# Patient Record
Sex: Female | Born: 1935 | Race: White | Hispanic: No | Smoking: Never smoker
Health system: Southern US, Community
[De-identification: ages and names within clinical notes are randomized; demographics above are authoritative.]

## PROBLEM LIST (undated history)

## (undated) DIAGNOSIS — R001 Bradycardia, unspecified: Secondary | ICD-10-CM

## (undated) DIAGNOSIS — E785 Hyperlipidemia, unspecified: Secondary | ICD-10-CM

## (undated) DIAGNOSIS — I10 Essential (primary) hypertension: Secondary | ICD-10-CM

## (undated) DIAGNOSIS — K219 Gastro-esophageal reflux disease without esophagitis: Secondary | ICD-10-CM

## (undated) DIAGNOSIS — D62 Acute posthemorrhagic anemia: Secondary | ICD-10-CM

## (undated) DIAGNOSIS — F329 Major depressive disorder, single episode, unspecified: Secondary | ICD-10-CM

## (undated) DIAGNOSIS — F32A Depression, unspecified: Secondary | ICD-10-CM

## (undated) DIAGNOSIS — M8580 Other specified disorders of bone density and structure, unspecified site: Secondary | ICD-10-CM

## (undated) DIAGNOSIS — K5790 Diverticulosis of intestine, part unspecified, without perforation or abscess without bleeding: Secondary | ICD-10-CM

## (undated) DIAGNOSIS — I480 Paroxysmal atrial fibrillation: Secondary | ICD-10-CM

## (undated) DIAGNOSIS — K922 Gastrointestinal hemorrhage, unspecified: Secondary | ICD-10-CM

## (undated) DIAGNOSIS — I5032 Chronic diastolic (congestive) heart failure: Secondary | ICD-10-CM

## (undated) DIAGNOSIS — F419 Anxiety disorder, unspecified: Secondary | ICD-10-CM

## (undated) HISTORY — DX: Depression, unspecified: F32.A

## (undated) HISTORY — DX: Gastro-esophageal reflux disease without esophagitis: K21.9

## (undated) HISTORY — DX: Essential (primary) hypertension: I10

## (undated) HISTORY — PX: TONSILLECTOMY: SUR1361

## (undated) HISTORY — PX: APPENDECTOMY: SHX54

## (undated) HISTORY — DX: Gastrointestinal hemorrhage, unspecified: K92.2

## (undated) HISTORY — DX: Anxiety disorder, unspecified: F41.9

## (undated) HISTORY — DX: Major depressive disorder, single episode, unspecified: F32.9

## (undated) HISTORY — PX: TOTAL ABDOMINAL HYSTERECTOMY: SHX209

## (undated) HISTORY — PX: CARDIAC DEFIBRILLATOR PLACEMENT: SHX171

## (undated) HISTORY — DX: Diverticulosis of intestine, part unspecified, without perforation or abscess without bleeding: K57.90

## (undated) HISTORY — DX: Bradycardia, unspecified: R00.1

## (undated) HISTORY — DX: Hyperlipidemia, unspecified: E78.5

## (undated) HISTORY — DX: Other specified disorders of bone density and structure, unspecified site: M85.80

## (undated) HISTORY — PX: INSERT / REPLACE / REMOVE PACEMAKER: SUR710

## (undated) HISTORY — DX: Acute posthemorrhagic anemia: D62

---

## 2001-12-04 ENCOUNTER — Ambulatory Visit (HOSPITAL_COMMUNITY): Admission: RE | Admit: 2001-12-04 | Discharge: 2001-12-04 | Payer: Self-pay | Admitting: Family Medicine

## 2001-12-04 ENCOUNTER — Encounter: Payer: Self-pay | Admitting: Family Medicine

## 2002-05-26 HISTORY — PX: ANKLE FRACTURE SURGERY: SHX122

## 2004-12-17 ENCOUNTER — Ambulatory Visit: Payer: Self-pay | Admitting: Family Medicine

## 2005-01-02 ENCOUNTER — Ambulatory Visit: Payer: Self-pay | Admitting: Family Medicine

## 2005-01-21 ENCOUNTER — Inpatient Hospital Stay: Payer: Self-pay | Admitting: Orthopaedic Surgery

## 2005-01-21 ENCOUNTER — Other Ambulatory Visit: Payer: Self-pay

## 2005-06-14 ENCOUNTER — Ambulatory Visit: Payer: Self-pay | Admitting: Family Medicine

## 2005-06-28 ENCOUNTER — Ambulatory Visit: Payer: Self-pay | Admitting: Family Medicine

## 2006-01-22 ENCOUNTER — Ambulatory Visit: Payer: Self-pay | Admitting: Family Medicine

## 2006-02-06 ENCOUNTER — Ambulatory Visit: Payer: Self-pay | Admitting: Family Medicine

## 2006-03-26 LAB — HM DEXA SCAN

## 2006-04-29 ENCOUNTER — Ambulatory Visit: Payer: Self-pay | Admitting: Family Medicine

## 2006-05-07 ENCOUNTER — Ambulatory Visit: Payer: Self-pay | Admitting: Family Medicine

## 2006-11-07 ENCOUNTER — Ambulatory Visit: Payer: Self-pay | Admitting: Family Medicine

## 2006-11-25 ENCOUNTER — Ambulatory Visit: Payer: Self-pay | Admitting: Cardiology

## 2006-12-02 ENCOUNTER — Ambulatory Visit: Payer: Self-pay | Admitting: Cardiology

## 2007-01-01 ENCOUNTER — Ambulatory Visit: Payer: Self-pay | Admitting: Cardiology

## 2007-03-18 ENCOUNTER — Ambulatory Visit: Payer: Self-pay | Admitting: Cardiology

## 2007-06-16 DIAGNOSIS — I319 Disease of pericardium, unspecified: Secondary | ICD-10-CM | POA: Insufficient documentation

## 2007-06-16 DIAGNOSIS — E7800 Pure hypercholesterolemia, unspecified: Secondary | ICD-10-CM | POA: Insufficient documentation

## 2007-06-16 DIAGNOSIS — I10 Essential (primary) hypertension: Secondary | ICD-10-CM

## 2007-06-16 DIAGNOSIS — I498 Other specified cardiac arrhythmias: Secondary | ICD-10-CM | POA: Insufficient documentation

## 2007-06-16 DIAGNOSIS — J309 Allergic rhinitis, unspecified: Secondary | ICD-10-CM | POA: Insufficient documentation

## 2007-06-16 DIAGNOSIS — M899 Disorder of bone, unspecified: Secondary | ICD-10-CM | POA: Insufficient documentation

## 2007-06-16 DIAGNOSIS — K649 Unspecified hemorrhoids: Secondary | ICD-10-CM | POA: Insufficient documentation

## 2007-06-16 DIAGNOSIS — M949 Disorder of cartilage, unspecified: Secondary | ICD-10-CM

## 2007-06-16 DIAGNOSIS — F411 Generalized anxiety disorder: Secondary | ICD-10-CM | POA: Insufficient documentation

## 2007-06-16 DIAGNOSIS — I872 Venous insufficiency (chronic) (peripheral): Secondary | ICD-10-CM | POA: Insufficient documentation

## 2007-06-16 DIAGNOSIS — R011 Cardiac murmur, unspecified: Secondary | ICD-10-CM

## 2007-06-16 DIAGNOSIS — K219 Gastro-esophageal reflux disease without esophagitis: Secondary | ICD-10-CM | POA: Insufficient documentation

## 2007-06-16 DIAGNOSIS — F4323 Adjustment disorder with mixed anxiety and depressed mood: Secondary | ICD-10-CM | POA: Insufficient documentation

## 2007-06-17 ENCOUNTER — Ambulatory Visit: Payer: Self-pay | Admitting: Family Medicine

## 2007-06-18 LAB — CONVERTED CEMR LAB
ALT: 21 units/L (ref 0–35)
AST: 26 units/L (ref 0–37)
Albumin: 4.3 g/dL (ref 3.5–5.2)
BUN: 10 mg/dL (ref 6–23)
Basophils Absolute: 0 10*3/uL (ref 0.0–0.1)
Basophils Relative: 0.6 % (ref 0.0–1.0)
CO2: 31 meq/L (ref 19–32)
Calcium: 10.1 mg/dL (ref 8.4–10.5)
Chloride: 107 meq/L (ref 96–112)
Cholesterol: 237 mg/dL (ref 0–200)
Creatinine, Ser: 0.9 mg/dL (ref 0.4–1.2)
Direct LDL: 153.2 mg/dL
Eosinophils Absolute: 0.1 10*3/uL (ref 0.0–0.6)
Eosinophils Relative: 2.3 % (ref 0.0–5.0)
GFR calc Af Amer: 77 mL/min
GFR calc non Af Amer: 64 mL/min
Glucose, Bld: 97 mg/dL (ref 70–99)
HCT: 41.5 % (ref 36.0–46.0)
HDL: 51.9 mg/dL (ref >39.0)
Hemoglobin: 14 g/dL (ref 12.0–15.0)
Lymphocytes Relative: 33 % (ref 12.0–46.0)
MCHC: 33.7 g/dL (ref 30.0–36.0)
MCV: 92.4 fL (ref 78.0–100.0)
Monocytes Absolute: 0.5 10*3/uL (ref 0.2–0.7)
Monocytes Relative: 9.2 % (ref 3.0–11.0)
Neutro Abs: 3 10*3/uL (ref 1.4–7.7)
Neutrophils Relative %: 54.9 % (ref 43.0–77.0)
Phosphorus: 3 mg/dL (ref 2.3–4.6)
Platelets: 189 10*3/uL (ref 150–400)
Potassium: 4.6 meq/L (ref 3.5–5.1)
RBC: 4.49 M/uL (ref 3.87–5.11)
RDW: 12.8 % (ref 11.5–14.6)
Sodium: 143 meq/L (ref 135–145)
TSH: 3.06 microintl units/mL (ref 0.35–5.50)
Total CHOL/HDL Ratio: 4.6
Triglycerides: 124 mg/dL (ref 0–149)
VLDL: 25 mg/dL (ref 0–40)
WBC: 5.4 10*3/uL (ref 4.5–10.5)

## 2007-06-24 ENCOUNTER — Ambulatory Visit: Payer: Self-pay | Admitting: Family Medicine

## 2007-06-25 ENCOUNTER — Encounter (INDEPENDENT_AMBULATORY_CARE_PROVIDER_SITE_OTHER): Payer: Self-pay | Admitting: *Deleted

## 2007-07-09 ENCOUNTER — Encounter: Payer: Self-pay | Admitting: Family Medicine

## 2007-07-15 ENCOUNTER — Encounter (INDEPENDENT_AMBULATORY_CARE_PROVIDER_SITE_OTHER): Payer: Self-pay | Admitting: *Deleted

## 2007-09-18 ENCOUNTER — Ambulatory Visit: Payer: Self-pay | Admitting: Family Medicine

## 2007-09-21 LAB — CONVERTED CEMR LAB
ALT: 17 units/L (ref 0–35)
AST: 21 units/L (ref 0–37)
Cholesterol: 199 mg/dL (ref 0–200)
HDL: 52 mg/dL (ref >39.0)
LDL Cholesterol: 127 mg/dL — ABNORMAL HIGH (ref 0–99)
Total CHOL/HDL Ratio: 3.8
Triglycerides: 99 mg/dL (ref 0–149)
VLDL: 20 mg/dL (ref 0–40)

## 2007-09-22 ENCOUNTER — Ambulatory Visit: Payer: Self-pay | Admitting: Family Medicine

## 2007-11-11 ENCOUNTER — Ambulatory Visit: Payer: Self-pay | Admitting: Family Medicine

## 2007-12-14 ENCOUNTER — Ambulatory Visit: Payer: Self-pay | Admitting: Internal Medicine

## 2007-12-30 ENCOUNTER — Ambulatory Visit: Payer: Self-pay | Admitting: Internal Medicine

## 2007-12-30 ENCOUNTER — Encounter: Payer: Self-pay | Admitting: Internal Medicine

## 2007-12-31 ENCOUNTER — Encounter: Payer: Self-pay | Admitting: Internal Medicine

## 2007-12-31 LAB — CONVERTED CEMR LAB
BUN: 18 mg/dL (ref 6–23)
Basophils Absolute: 0 10*3/uL (ref 0.0–0.1)
Basophils Relative: 1 % (ref 0–1)
CO2: 22 meq/L (ref 19–32)
Calcium: 9.4 mg/dL (ref 8.4–10.5)
Chloride: 108 meq/L (ref 96–112)
Creatinine, Ser: 0.82 mg/dL (ref 0.40–1.20)
Eosinophils Absolute: 0.1 10*3/uL (ref 0.0–0.7)
Eosinophils Relative: 3 % (ref 0–5)
Glucose, Bld: 90 mg/dL (ref 70–99)
HCT: 42.8 % (ref 36.0–46.0)
Hemoglobin: 13.6 g/dL (ref 12.0–15.0)
INR: 1.1 (ref 0.0–1.5)
Lymphocytes Relative: 35 % (ref 12–46)
Lymphs Abs: 1.6 10*3/uL (ref 0.7–4.0)
MCHC: 31.8 g/dL (ref 30.0–36.0)
MCV: 97.7 fL (ref 78.0–100.0)
Monocytes Absolute: 0.5 10*3/uL (ref 0.1–1.0)
Monocytes Relative: 11 % (ref 3–12)
Neutro Abs: 2.4 10*3/uL (ref 1.7–7.7)
Neutrophils Relative %: 52 % (ref 43–77)
Platelets: 165 10*3/uL (ref 150–400)
Potassium: 4.3 meq/L (ref 3.5–5.3)
Prothrombin Time: 14 s (ref 11.6–15.2)
RBC: 4.38 M/uL (ref 3.87–5.11)
RDW: 13.9 % (ref 11.5–15.5)
Sodium: 145 meq/L (ref 135–145)
WBC: 4.8 10*3/uL (ref 4.0–10.5)
aPTT: 37 s (ref 24–37)

## 2008-01-07 ENCOUNTER — Ambulatory Visit: Payer: Self-pay | Admitting: Internal Medicine

## 2008-01-07 ENCOUNTER — Ambulatory Visit (HOSPITAL_COMMUNITY): Admission: RE | Admit: 2008-01-07 | Discharge: 2008-01-08 | Payer: Self-pay | Admitting: Internal Medicine

## 2008-01-07 ENCOUNTER — Encounter: Payer: Self-pay | Admitting: Family Medicine

## 2008-01-08 ENCOUNTER — Encounter: Payer: Self-pay | Admitting: Family Medicine

## 2008-01-25 ENCOUNTER — Ambulatory Visit: Payer: Self-pay

## 2008-04-11 ENCOUNTER — Ambulatory Visit: Payer: Self-pay | Admitting: Internal Medicine

## 2008-05-18 ENCOUNTER — Ambulatory Visit: Payer: Self-pay | Admitting: Family Medicine

## 2008-06-27 ENCOUNTER — Ambulatory Visit: Payer: Self-pay | Admitting: Family Medicine

## 2008-06-30 LAB — CONVERTED CEMR LAB
ALT: 22 units/L (ref 0–35)
AST: 27 units/L (ref 0–37)
Albumin: 4.4 g/dL (ref 3.5–5.2)
Alkaline Phosphatase: 67 units/L (ref 39–117)
BUN: 19 mg/dL (ref 6–23)
Basophils Absolute: 0.1 10*3/uL (ref 0.0–0.1)
Basophils Relative: 0.9 % (ref 0.0–3.0)
Bilirubin, Direct: 0.1 mg/dL (ref 0.0–0.3)
CO2: 33 meq/L — ABNORMAL HIGH (ref 19–32)
Calcium: 9.9 mg/dL (ref 8.4–10.5)
Chloride: 103 meq/L (ref 96–112)
Creatinine, Ser: 1 mg/dL (ref 0.4–1.2)
Eosinophils Absolute: 0.1 10*3/uL (ref 0.0–0.7)
Eosinophils Relative: 2.1 % (ref 0.0–5.0)
GFR calc Af Amer: 68 mL/min
GFR calc non Af Amer: 56 mL/min
Glucose, Bld: 84 mg/dL (ref 70–99)
HCT: 37.5 % (ref 36.0–46.0)
Hemoglobin: 13.1 g/dL (ref 12.0–15.0)
Lymphocytes Relative: 27.5 % (ref 12.0–46.0)
MCHC: 34.9 g/dL (ref 30.0–36.0)
MCV: 93 fL (ref 78.0–100.0)
Monocytes Absolute: 0.3 10*3/uL (ref 0.1–1.0)
Monocytes Relative: 4.8 % (ref 3.0–12.0)
Neutro Abs: 4.3 10*3/uL (ref 1.4–7.7)
Neutrophils Relative %: 64.7 % (ref 43.0–77.0)
Phosphorus: 3.6 mg/dL (ref 2.3–4.6)
Platelets: 176 10*3/uL (ref 150–400)
Potassium: 4.4 meq/L (ref 3.5–5.1)
RBC: 4.04 M/uL (ref 3.87–5.11)
RDW: 12.3 % (ref 11.5–14.6)
Sodium: 141 meq/L (ref 135–145)
TSH: 3.68 microintl units/mL (ref 0.35–5.50)
Total Bilirubin: 0.7 mg/dL (ref 0.3–1.2)
Total Protein: 7.2 g/dL (ref 6.0–8.3)
Vit D, 1,25-Dihydroxy: 27 — ABNORMAL LOW (ref 30–89)
WBC: 6.6 10*3/uL (ref 4.5–10.5)

## 2008-07-15 ENCOUNTER — Ambulatory Visit: Payer: Self-pay | Admitting: Family Medicine

## 2008-07-15 LAB — CONVERTED CEMR LAB
OCCULT 1: NEGATIVE
OCCULT 2: NEGATIVE
OCCULT 3: NEGATIVE

## 2008-07-20 ENCOUNTER — Encounter: Payer: Self-pay | Admitting: Family Medicine

## 2008-07-25 ENCOUNTER — Telehealth: Payer: Self-pay | Admitting: Family Medicine

## 2008-10-03 ENCOUNTER — Ambulatory Visit: Payer: Self-pay | Admitting: Family Medicine

## 2008-10-03 DIAGNOSIS — M6740 Ganglion, unspecified site: Secondary | ICD-10-CM | POA: Insufficient documentation

## 2008-10-03 DIAGNOSIS — E559 Vitamin D deficiency, unspecified: Secondary | ICD-10-CM | POA: Insufficient documentation

## 2008-10-03 DIAGNOSIS — M1990 Unspecified osteoarthritis, unspecified site: Secondary | ICD-10-CM | POA: Insufficient documentation

## 2008-10-05 ENCOUNTER — Encounter (INDEPENDENT_AMBULATORY_CARE_PROVIDER_SITE_OTHER): Payer: Self-pay | Admitting: *Deleted

## 2008-10-05 LAB — CONVERTED CEMR LAB: Vit D, 1,25-Dihydroxy: 30 (ref 30–89)

## 2008-12-09 ENCOUNTER — Encounter (INDEPENDENT_AMBULATORY_CARE_PROVIDER_SITE_OTHER): Payer: Self-pay

## 2009-01-09 ENCOUNTER — Ambulatory Visit: Payer: Self-pay | Admitting: Internal Medicine

## 2009-01-20 ENCOUNTER — Telehealth: Payer: Self-pay | Admitting: Family Medicine

## 2009-01-20 ENCOUNTER — Encounter (INDEPENDENT_AMBULATORY_CARE_PROVIDER_SITE_OTHER): Payer: Self-pay | Admitting: *Deleted

## 2009-02-23 ENCOUNTER — Ambulatory Visit: Payer: Self-pay | Admitting: Family Medicine

## 2009-03-06 ENCOUNTER — Ambulatory Visit: Payer: Self-pay | Admitting: Family Medicine

## 2009-04-12 ENCOUNTER — Encounter: Payer: Self-pay | Admitting: Internal Medicine

## 2009-04-12 ENCOUNTER — Ambulatory Visit: Payer: Self-pay | Admitting: Internal Medicine

## 2009-04-17 ENCOUNTER — Encounter: Payer: Self-pay | Admitting: Internal Medicine

## 2009-07-12 ENCOUNTER — Ambulatory Visit: Payer: Self-pay | Admitting: Internal Medicine

## 2009-07-13 ENCOUNTER — Encounter: Payer: Self-pay | Admitting: Internal Medicine

## 2009-07-13 ENCOUNTER — Telehealth (INDEPENDENT_AMBULATORY_CARE_PROVIDER_SITE_OTHER): Payer: Self-pay | Admitting: *Deleted

## 2009-07-19 ENCOUNTER — Ambulatory Visit: Payer: Self-pay | Admitting: Family Medicine

## 2009-07-24 LAB — CONVERTED CEMR LAB
ALT: 17 units/L (ref 0–35)
AST: 23 units/L (ref 0–37)
Albumin: 4.5 g/dL (ref 3.5–5.2)
Alkaline Phosphatase: 59 units/L (ref 39–117)
BUN: 15 mg/dL (ref 6–23)
Basophils Absolute: 0 10*3/uL (ref 0.0–0.1)
Basophils Relative: 0.6 % (ref 0.0–3.0)
Bilirubin, Direct: 0 mg/dL (ref 0.0–0.3)
CO2: 31 meq/L (ref 19–32)
Calcium: 10 mg/dL (ref 8.4–10.5)
Chloride: 99 meq/L (ref 96–112)
Cholesterol: 273 mg/dL — ABNORMAL HIGH (ref 0–200)
Creatinine, Ser: 1.1 mg/dL (ref 0.4–1.2)
Direct LDL: 193.4 mg/dL
Eosinophils Absolute: 0.1 10*3/uL (ref 0.0–0.7)
Eosinophils Relative: 2.4 % (ref 0.0–5.0)
GFR calc non Af Amer: 50.3 mL/min (ref >60)
Glucose, Bld: 104 mg/dL — ABNORMAL HIGH (ref 70–99)
HCT: 37.5 % (ref 36.0–46.0)
HDL: 61.8 mg/dL (ref >39.00)
Hemoglobin: 12.7 g/dL (ref 12.0–15.0)
Lymphocytes Relative: 25.1 % (ref 12.0–46.0)
Lymphs Abs: 1.1 10*3/uL (ref 0.7–4.0)
MCHC: 34 g/dL (ref 30.0–36.0)
MCV: 95 fL (ref 78.0–100.0)
Monocytes Absolute: 0.4 10*3/uL (ref 0.1–1.0)
Monocytes Relative: 9 % (ref 3.0–12.0)
Neutro Abs: 2.9 10*3/uL (ref 1.4–7.7)
Neutrophils Relative %: 62.9 % (ref 43.0–77.0)
Phosphorus: 3.6 mg/dL (ref 2.3–4.6)
Platelets: 176 10*3/uL (ref 150.0–400.0)
Potassium: 4.4 meq/L (ref 3.5–5.1)
RBC: 3.94 M/uL (ref 3.87–5.11)
RDW: 12.7 % (ref 11.5–14.6)
Sodium: 138 meq/L (ref 135–145)
TSH: 2.44 microintl units/mL (ref 0.35–5.50)
Total Bilirubin: 1.2 mg/dL (ref 0.3–1.2)
Total CHOL/HDL Ratio: 4
Total Protein: 7.5 g/dL (ref 6.0–8.3)
Triglycerides: 90 mg/dL (ref 0.0–149.0)
VLDL: 18 mg/dL (ref 0.0–40.0)
Vit D, 25-Hydroxy: 25 ng/mL — ABNORMAL LOW (ref 30–89)
WBC: 4.5 10*3/uL (ref 4.5–10.5)

## 2009-07-28 ENCOUNTER — Ambulatory Visit: Payer: Self-pay | Admitting: Family Medicine

## 2009-07-29 LAB — CONVERTED CEMR LAB: Fecal Occult Bld: NEGATIVE

## 2009-07-31 ENCOUNTER — Encounter (INDEPENDENT_AMBULATORY_CARE_PROVIDER_SITE_OTHER): Payer: Self-pay | Admitting: *Deleted

## 2009-08-02 ENCOUNTER — Encounter: Payer: Self-pay | Admitting: Internal Medicine

## 2009-08-04 ENCOUNTER — Encounter: Payer: Self-pay | Admitting: Family Medicine

## 2009-08-15 ENCOUNTER — Encounter (INDEPENDENT_AMBULATORY_CARE_PROVIDER_SITE_OTHER): Payer: Self-pay | Admitting: *Deleted

## 2009-08-15 LAB — HM MAMMOGRAPHY: HM Mammogram: NORMAL

## 2009-08-31 ENCOUNTER — Ambulatory Visit: Payer: Self-pay | Admitting: Family Medicine

## 2009-08-31 DIAGNOSIS — R42 Dizziness and giddiness: Secondary | ICD-10-CM | POA: Insufficient documentation

## 2009-09-26 ENCOUNTER — Ambulatory Visit: Payer: Self-pay | Admitting: Family Medicine

## 2009-10-11 ENCOUNTER — Ambulatory Visit: Payer: Self-pay | Admitting: Internal Medicine

## 2009-10-20 ENCOUNTER — Encounter: Payer: Self-pay | Admitting: Internal Medicine

## 2009-10-30 ENCOUNTER — Ambulatory Visit: Payer: Self-pay | Admitting: Family Medicine

## 2009-12-25 ENCOUNTER — Ambulatory Visit: Payer: Self-pay | Admitting: Internal Medicine

## 2009-12-25 DIAGNOSIS — I4949 Other premature depolarization: Secondary | ICD-10-CM

## 2009-12-25 DIAGNOSIS — Z950 Presence of cardiac pacemaker: Secondary | ICD-10-CM | POA: Insufficient documentation

## 2010-01-29 ENCOUNTER — Ambulatory Visit: Payer: Self-pay | Admitting: Family Medicine

## 2010-01-30 LAB — CONVERTED CEMR LAB
ALT: 18 units/L (ref 0–35)
AST: 23 units/L (ref 0–37)
Direct LDL: 175.2 mg/dL
HDL: 56 mg/dL (ref >39.00)
Total CHOL/HDL Ratio: 5
VLDL: 24.6 mg/dL (ref 0.0–40.0)

## 2010-02-01 ENCOUNTER — Ambulatory Visit: Payer: Self-pay | Admitting: Family Medicine

## 2010-03-21 ENCOUNTER — Ambulatory Visit: Payer: Self-pay | Admitting: Internal Medicine

## 2010-07-25 ENCOUNTER — Ambulatory Visit: Payer: Self-pay | Admitting: Family Medicine

## 2010-07-25 LAB — CONVERTED CEMR LAB
ALT: 15 units/L (ref 0–35)
AST: 21 units/L (ref 0–37)
BUN: 20 mg/dL (ref 6–23)
Basophils Absolute: 0 10*3/uL (ref 0.0–0.1)
CO2: 32 meq/L (ref 19–32)
Chloride: 102 meq/L (ref 96–112)
Eosinophils Relative: 2.5 % (ref 0.0–5.0)
GFR calc non Af Amer: 48.15 mL/min (ref >60)
HCT: 35.6 % — ABNORMAL LOW (ref 36.0–46.0)
Hemoglobin: 12.2 g/dL (ref 12.0–15.0)
Lymphocytes Relative: 28.4 % (ref 12.0–46.0)
Lymphs Abs: 1.4 10*3/uL (ref 0.7–4.0)
Monocytes Relative: 10 % (ref 3.0–12.0)
Neutro Abs: 2.8 10*3/uL (ref 1.4–7.7)
Phosphorus: 3.4 mg/dL (ref 2.3–4.6)
Potassium: 4.8 meq/L (ref 3.5–5.1)
RBC: 3.8 M/uL — ABNORMAL LOW (ref 3.87–5.11)
RDW: 13.6 % (ref 11.5–14.6)
Sodium: 141 meq/L (ref 135–145)
WBC: 4.8 10*3/uL (ref 4.5–10.5)

## 2010-07-29 LAB — CONVERTED CEMR LAB: Vit D, 25-Hydroxy: 25 ng/mL — ABNORMAL LOW (ref 30–89)

## 2010-08-01 ENCOUNTER — Ambulatory Visit: Payer: Self-pay | Admitting: Family Medicine

## 2010-08-16 ENCOUNTER — Ambulatory Visit: Payer: Self-pay | Admitting: Family Medicine

## 2010-09-25 NOTE — Assessment & Plan Note (Signed)
Summary: F/U AFTER LABS / LFW   Vital Signs:  Patient profile:   77 year old female Height:      63 inches Weight:      154.75 pounds BMI:     27.51 Temp:     97.9 degrees F oral Pulse rate:   68 / minute Pulse rhythm:   regular BP sitting:   142 / 88  (left arm) Cuff size:   regular  Vitals Entered By: Lewanda Rife LPN (February 01, 453 8:31 AM)  Serial Vital Signs/Assessments:  Time      Position  BP       Pulse  Resp  Temp     By                     132/85                         Judith Part MD  CC: three month f/u after labs   History of Present Illness: here for f/u of chol and HTN  lipids did improve but still quite high trig 123, HDL 56 (good) and LDL 175 down from 193 has fam hx high chol diet -- is doing pretty good - staying away from red meat and fried foods still eats some cookies - but less   is not interested in medicine   wt is down 3 lb  is exercising by working in the yard   bp first check 142/88  Allergies: 1)  Penicillin 2)  Macrodantin 3)  Ace Inhibitors 4)  Fosamax  Past History:  Past Surgical History: Last updated: 01/20/2008 Appendectomy Hysterectomy- total, fibroids Tonsillectomy Ankle fracture- surgery (05/2002) Duodenal ulcer- tx'd for H pylori Elbow fracture 906/2006) MRI head- age related ischemic changes and volume loss (05/2005) Dexa- osteopenia femoral neck T-1.85 (03/2006) 5/09 pacemaker implantation   Family History: Last updated: 01/09/2009 Father: MI age 1 Mother: died from pneumonia age 29, had alzheimers, dep Siblings: brother with septal defect and CABG x 3, another brother died as a child GM bladder ca  Social History: Last updated: 06/27/2008 Marital Status: Married Children: 2 Occupation: retired Never Smoked alcohol- infrequently  Risk Factors: Smoking Status: never (11/11/2007)  Past Medical History: Allergic rhinitis Anxiety Depression GERD Hypertension Osteopenia bradycardia--  pacemaker 5/09 hyperlipidemia   Review of Systems General:  Denies fatigue, loss of appetite, and malaise. Eyes:  Denies blurring and eye irritation. CV:  Denies chest pain or discomfort, palpitations, and shortness of breath with exertion. Resp:  Denies cough and wheezing. GI:  Denies abdominal pain, bloody stools, change in bowel habits, indigestion, and nausea. MS:  Denies joint pain, joint redness, joint swelling, and stiffness. Derm:  Denies itching, lesion(s), poor wound healing, and rash. Neuro:  Denies numbness and tingling. Endo:  Denies cold intolerance, excessive thirst, excessive urination, and heat intolerance. Heme:  Denies abnormal bruising and bleeding.  Physical Exam  General:  Well-developed,well-nourished,in no acute distress; alert,appropriate and cooperative throughout examination Head:  normocephalic, atraumatic, and no abnormalities observed.   Eyes:  vision grossly intact, pupils equal, pupils round, and pupils reactive to light.   Mouth:  pharynx pink and moist.   Neck:  supple with full rom and no masses or thyromegally, no JVD or carotid bruit  Lungs:  Normal respiratory effort, chest expands symmetrically. Lungs are clear to auscultation, no crackles or wheezes. Heart:  Normal rate and regular rhythm. S1 and S2 normal without  gallop, murmur, click, rub or other extra sounds. Msk:  No deformity or scoliosis noted of thoracic or lumbar spine.   Extremities:  No clubbing, cyanosis, edema, or deformity noted with normal full range of motion of all joints.   some varicosities  Neurologic:  sensation intact to light touch, gait normal, and DTRs symmetrical and normal.   Skin:  Intact without suspicious lesions or rashes Cervical Nodes:  No lymphadenopathy noted Psych:  normal affect, talkative and pleasant    Impression & Recommendations:  Problem # 1:  Hx of HYPERCHOLESTEROLEMIA (ICD-272.0) Assessment Improved  this is improved but not at goal at pt's age  - she does not wish to tx it  we disc low sat fat diet in detail  will re check 6 mo   Labs Reviewed: SGOT: 23 (01/29/2010)   SGPT: 18 (01/29/2010)  Prior 10 Yr Risk Heart Disease: 15 % (03/06/2009)   HDL:56.00 (01/29/2010), 61.80 (07/19/2009)  LDL:127 (09/18/2007), DEL (06/17/2007)  Chol:260 (01/29/2010), 273 (07/19/2009)  Trig:123.0 (01/29/2010), 90.0 (07/19/2009)  Problem # 2:  HYPERTENSION (ICD-401.9) Assessment: Unchanged  better on 2nd check fairly controlled with current med and exercise  Her updated medication list for this problem includes:    Diovan 160 Mg Tabs (Valsartan) ..... One by mouth daily    Hydrochlorothiazide 25 Mg Tabs (Hydrochlorothiazide) .Marland Kitchen... 1 by mouth once daily in am  BP today: 142/88-- re check 132/ 85 at rest  Prior BP: 142/50 (12/25/2009)  Prior 10 Yr Risk Heart Disease: 15 % (03/06/2009)  Labs Reviewed: K+: 4.4 (07/19/2009) Creat: : 1.1 (07/19/2009)   Chol: 260 (01/29/2010)   HDL: 56.00 (01/29/2010)   LDL: 127 (09/18/2007)   TG: 123.0 (01/29/2010)  Complete Medication List: 1)  Zoloft 100 Mg Tabs (Sertraline hcl) .... One by mouth once daily 2)  Diovan 160 Mg Tabs (Valsartan) .... One by mouth daily 3)  Ibuprofen 800 Mg Tabs (Ibuprofen) .... One by mouth once daily as needed- always with food 4)  Centrum Silver Tabs (Multiple vitamins-minerals) .... Take 1 tablet by mouth once a day 5)  Hydrochlorothiazide 25 Mg Tabs (Hydrochlorothiazide) .Marland Kitchen.. 1 by mouth once daily in am 6)  Omeprazole 20 Mg Cpdr (Omeprazole) .Marland Kitchen.. 1 by mouth once daily in the am as needed  Patient Instructions: 1)  cholesterol is improved but still high 2)  work the best you can with diet  3)  if you are interested in cholesterol medicine - let me know 4)  no change in medicines 5)  schedule fasting labs in 6 months and then 30 min check up  6)  lipid/ast/alt/renal / cbc with diff/ tsh vit D level   272, 401.1, 733.  Current Allergies (reviewed  today): PENICILLIN MACRODANTIN ACE INHIBITORS FOSAMAX
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
## 2010-09-25 NOTE — Assessment & Plan Note (Signed)
Summary: FOLLOW UP FROM CPX/[facility]H   Vital Signs:  Patient profile:   77 year old female Height:      62 inches Weight:      158.25 pounds Temp:     97.5 degrees F oral Pulse rate:   76 / minute Pulse rhythm:   regular BP sitting:   138 / 82  (left arm) Cuff size:   regular  Vitals Entered By: Lewanda Rife LPN (October 31, 1015 8:58 AM)  History of Present Illness: here for high cholesterol - it had gone way up   wt is down 2 lb  HTN stable 138/82 today   lipids trig 90/ HDL 61 and LDL 193 has never been on chol med brother has has high chol   eats red meat 2 times per mo  fried foods -- once per week  fast food about once per week  eats mod amt of cheese  breakfast meats - hardly ever  does eat eggs -- 2-3 per week  shellfish -- none  flounder fried once per week  does eat some sweets -- cookies mostly  does not cook with real butter at all - uses corn and olive oil       Allergies: 1)  Penicillin 2)  Macrodantin 3)  Ace Inhibitors 4)  Fosamax  Past History:  Past Medical History: Last updated: 01/20/2008 Allergic rhinitis Anxiety Depression GERD Hypertension Osteopenia bradycardia-- pacemaker 5/09  Past Surgical History: Last updated: 01/20/2008 Appendectomy Hysterectomy- total, fibroids Tonsillectomy Ankle fracture- surgery (05/2002) Duodenal ulcer- tx'd for H pylori Elbow fracture 906/2006) MRI head- age related ischemic changes and volume loss (05/2005) Dexa- osteopenia femoral neck T-1.85 (03/2006) 5/09 pacemaker implantation   Family History: Last updated: 01/09/2009 Father: MI age 82 Mother: died from pneumonia age 49, had alzheimers, dep Siblings: brother with septal defect and CABG x 3, another brother died as a child GM bladder ca  Social History: Last updated: 06/27/2008 Marital Status: Married Children: 2 Occupation: retired Never Smoked alcohol- infrequently  Risk Factors: Smoking Status: never (11/11/2007)  Review of  Systems General:  Denies fatigue, fever, loss of appetite, and malaise. Eyes:  Denies blurring and eye irritation. CV:  Denies chest pain or discomfort, palpitations, and shortness of breath with exertion. Resp:  Denies chest pain with inspiration, cough, and wheezing. GI:  Denies abdominal pain and change in bowel habits. Derm:  Denies rash. Neuro:  Denies headaches, numbness, and weakness. Endo:  Denies cold intolerance, excessive thirst, and heat intolerance. Heme:  Denies abnormal bruising and bleeding.  Physical Exam  General:  Well-developed,well-nourished,in no acute distress; alert,appropriate and cooperative throughout examination Head:  normocephalic, atraumatic, and no abnormalities observed.   Eyes:  vision grossly intact, pupils equal, pupils round, and pupils reactive to light.   Mouth:  pharynx pink and moist.   Neck:  supple with full rom and no masses or thyromegally, no JVD or carotid bruit  Lungs:  Normal respiratory effort, chest expands symmetrically. Lungs are clear to auscultation, no crackles or wheezes. Heart:  Normal rate and regular rhythm. S1 and S2 normal without gallop, murmur, click, rub or other extra sounds. Pulses:  R and L posterior tibial pulses are full and equal bilaterally  Extremities:  No clubbing, cyanosis, edema, or deformity noted with normal full range of motion of all joints.   Neurologic:  sensation intact to light touch, gait normal, and DTRs symmetrical and normal.   Skin:  Intact without suspicious lesions or rashes Cervical Nodes:  No lymphadenopathy noted Psych:  normal affect, talkative and pleasant    Impression & Recommendations:  Problem # 1:  Hx of HYPERCHOLESTEROLEMIA (ICD-272.0) Assessment Deteriorated  much worse lately with high sat fat diet  (LDL up from 120s to 190s) disc this in detail and suggestions made  will work on this and exercise for 3 mo and then re check fasting lab and f/u   Labs Reviewed: SGOT: 23  (07/19/2009)   SGPT: 17 (07/19/2009)  Prior 10 Yr Risk Heart Disease: 15 % (03/06/2009)   HDL:61.80 (07/19/2009), 52.0 (09/18/2007)  LDL:127 (09/18/2007), DEL (06/17/2007)  Chol:273 (07/19/2009), 199 (09/18/2007)  Trig:90.0 (07/19/2009), 99 (09/18/2007)  Complete Medication List: 1)  Zoloft 100 Mg Tabs (Sertraline hcl) .... One by mouth once daily 2)  Diovan 160 Mg Tabs (Valsartan) .... One by mouth daily 3)  Ibuprofen 800 Mg Tabs (Ibuprofen) .... One by mouth once daily as needed- always with food 4)  Centrum Silver Tabs (Multiple vitamins-minerals) .... Take 1 tablet by mouth once a day 5)  Hydrochlorothiazide 25 Mg Tabs (Hydrochlorothiazide) .Marland Kitchen.. 1 by mouth once daily in am 6)  Omeprazole 20 Mg Cpdr (Omeprazole) .Marland Kitchen.. 1 by mouth once daily in the am  Patient Instructions: 1)  please try to eliminate fried foods and fast foods 2)  eat your fish grilled or broiled  3)  eat egg whitew without the yolks 4)  avoid red meat as much as possible  5)  cut back on the cookies  6)  schedule fasting labs in 3 months lipid/ast/alt 272 and then follow up please   Current Allergies (reviewed today): PENICILLIN MACRODANTIN ACE INHIBITORS FOSAMAX

## 2010-09-25 NOTE — Assessment & Plan Note (Signed)
Summary: THROWING UP AND DIZZY./DLO   Vital Signs:  Patient profile:   77 year old female Height:      62 inches Weight:      158.2 pounds Temp:     98.3 degrees F oral Pulse rate:   84 / minute Pulse rhythm:   regular BP sitting:   120 / 70  (left arm) Cuff size:   regular  Vitals Entered By: Benny Lennert CMA Duncan Dull) (August 31, 2009 4:18 PM)  History of Present Illness: CC: Vomiting and dizzyness.  Yesterday began feeling off balance sudden onset. Awaoke this AM in bed with room spinning. Feels room spinning when lying down. no current vertigo , but feels slightly off balance sitting there. HAs been nauseous and vomited after eating , unable to keep food or liquids down.   No abdominal pain., no chest pain.  No fever.  2 sharp momentary pain right ear. No neuro symptoms, no weakness, no confusion.   Problems Prior to Update: 1)  Gerd  (ICD-530.81) 2)  Ganglion Cyst  (ICD-727.43) 3)  Osteoarthritis  (ICD-715.90) 4)  Unspecified Vitamin D Deficiency  (ICD-268.9) 5)  Other Screening Mammogram  (ICD-V76.12) 6)  Screening For Malignant Neoplasm, Colon  (ICD-V76.51) 7)  Hx of Venous Insufficiency  (ICD-459.81) 8)  Hemorrhoids  (ICD-455.6) 9)  Hx of Pericardial Effusion  (ICD-423.9) 10)  Murmur  (ICD-785.2) 11)  Bradycardia  (ICD-427.89) 12)  Hx of Hypercholesterolemia  (ICD-272.0) 13)  Osteopenia  (ICD-733.90) 14)  Hypertension  (ICD-401.9) 15)  Gerd  (ICD-530.81) 16)  Depression  (ICD-311) 17)  Anxiety  (ICD-300.00) 18)  Allergic Rhinitis  (ICD-477.9)  Current Medications (verified): 1)  Zoloft 100 Mg  Tabs (Sertraline Hcl) .... One By Mouth Once Daily 2)  Diovan 160 Mg  Tabs (Valsartan) .... One By Mouth Daily 3)  Ibuprofen 800 Mg  Tabs (Ibuprofen) .... One By Mouth Once Daily As Needed- Always With Food 4)  Centrum Silver   Tabs (Multiple Vitamins-Minerals) .... Take 1 Tablet By Mouth Once A Day 5)  Hydrochlorothiazide 25 Mg Tabs (Hydrochlorothiazide) .Marland Kitchen.. 1  By Mouth Once Daily in Am 6)  Omeprazole 20 Mg Cpdr (Omeprazole) .Marland Kitchen.. 1 By Mouth Once Daily in The Am 7)  Astepro 0.15 % Soln (Azelastine Hcl) .Marland Kitchen.. 1-2 Sprays in Each Nostril Two Times A Day As Needed Allergy Symptoms  Allergies: 1)  Penicillin 2)  Macrodantin 3)  Ace Inhibitors 4)  Fosamax  Past History:  Past medical, surgical, family and social histories (including risk factors) reviewed, and no changes noted (except as noted below).  Past Medical History: Reviewed history from 01/20/2008 and no changes required. Allergic rhinitis Anxiety Depression GERD Hypertension Osteopenia bradycardia-- pacemaker 5/09  Past Surgical History: Reviewed history from 01/20/2008 and no changes required. Appendectomy Hysterectomy- total, fibroids Tonsillectomy Ankle fracture- surgery (05/2002) Duodenal ulcer- tx'd for H pylori Elbow fracture 906/2006) MRI head- age related ischemic changes and volume loss (05/2005) Dexa- osteopenia femoral neck T-1.85 (03/2006) 5/09 pacemaker implantation   Family History: Reviewed history from 01/09/2009 and no changes required. Father: MI age 32 Mother: died from pneumonia age 59, had alzheimers, dep Siblings: brother with septal defect and CABG x 3, another brother died as a child GM bladder ca  Social History: Reviewed history from 06/27/2008 and no changes required. Marital Status: Married Children: 2 Occupation: retired Never Smoked alcohol- infrequently  Review of Systems General:  Denies fatigue and fever. CV:  Denies chest pain or discomfort. Resp:  Denies shortness of  breath. GI:  Denies abdominal pain, bloody stools, constipation, and diarrhea. GU:  Denies abnormal vaginal bleeding and dysuria.  Physical Exam  General:  elderly femal ein NAd Eyes:  No corneal or conjunctival inflammation noted. EOMI. Perrla. Funduscopic exam benign, without hemorrhages, exudates or papilledema. Vision grossly normal. Ears:  External ear exam  shows no significant lesions or deformities.  Otoscopic examination reveals clear canals, tympanic membranes are intact bilaterally without bulging, retraction, inflammation or discharge. Hearing is grossly normal bilaterally. Nose:  External nasal examination shows no deformity or inflammation. Nasal mucosa are pink and moist without lesions or exudates. Mouth:  MMM Neck:  no carotid bruit or thyromegaly no cervical or supraclavicular lymphadenopathy  Lungs:  Normal respiratory effort, chest expands symmetrically. Lungs are clear to auscultation, no crackles or wheezes. Heart:  Normal rate and regular rhythm. S1 and S2 normal without gallop, murmur, click, rub or other extra sounds. Pulses:  R and L posterior tibial pulses are full and equal bilaterally  Extremities:  no edema  Neurologic:  No cranial nerve deficits noted. Station and gait are normal. Plantar reflexes are down-going bilaterally. DTRs are symmetrical throughout. Sensory, motor and coordinative functions appear intact.finger-to-nose normal and Romberg negative.   Unable to eply well.    Impression & Recommendations:  Problem # 1:  INTERMITTENT VERTIGO (ICD-780.4) Likely BPPV vz viral labrynthitis. Use meclizine as needed. Home exercsies given. Call if not improving in 2 weeks.  Her updated medication list for this problem includes:    Meclizine Hcl 25 Mg Tabs (Meclizine hcl) .Marland Kitchen... 1 tab by mouth q 6 hours as needed vertigo/nausea  Problem # 2:  NAUSEA AND VOMITING (ICD-787.01) Likely due to #1  vs viral GE. Treat with meclize, advance diet as tolerated. Call in AM if not tolerating liquids.   Complete Medication List: 1)  Zoloft 100 Mg Tabs (Sertraline hcl) .... One by mouth once daily 2)  Diovan 160 Mg Tabs (Valsartan) .... One by mouth daily 3)  Ibuprofen 800 Mg Tabs (Ibuprofen) .... One by mouth once daily as needed- always with food 4)  Centrum Silver Tabs (Multiple vitamins-minerals) .... Take 1 tablet by mouth once a  day 5)  Hydrochlorothiazide 25 Mg Tabs (Hydrochlorothiazide) .Marland Kitchen.. 1 by mouth once daily in am 6)  Omeprazole 20 Mg Cpdr (Omeprazole) .Marland Kitchen.. 1 by mouth once daily in the am 7)  Astepro 0.15 % Soln (Azelastine hcl) .Marland Kitchen.. 1-2 sprays in each nostril two times a day as needed allergy symptoms 8)  Meclizine Hcl 25 Mg Tabs (Meclizine hcl) .Marland Kitchen.. 1 tab by mouth q 6 hours as needed vertigo/nausea  Patient Instructions: 1)  Use meclizine for vertigo. 2)  Gradually increase liquid intake slowly over then next few hours.  3)  Push fluids.  4)  Call in AM if not tolerating any liquids.  5)  Call if vertigo not improving with home movement exercsies in 2 weeks.  Prescriptions: MECLIZINE HCL 25 MG TABS (MECLIZINE HCL) 1 tab by mouth q 6 hours as needed vertigo/nausea  #20 x 0   Entered and Authorized by:   Kerby Nora MD   Signed by:   Kerby Nora MD on 08/31/2009   Method used:   Electronically to        [id number] * (retail)       [address], [place]  [address]  Ph: [phone] or [phone]       Fax: [phone]   RxID:   [id number]   Current Allergies (reviewed today): PENICILLIN MACRODANTIN ACE INHIBITORS FOSAMAX

## 2010-09-25 NOTE — Procedures (Signed)
Summary: PACER CHECK   Current Medications (verified): 1)  Zoloft 100 Mg  Tabs (Sertraline Hcl) .... One By Mouth Once Daily 2)  Diovan 160 Mg  Tabs (Valsartan) .... One By Mouth Daily 3)  Ibuprofen 800 Mg  Tabs (Ibuprofen) .... One By Mouth Once Daily As Needed- Always With Food 4)  Centrum Silver   Tabs (Multiple Vitamins-Minerals) .... Take 1 Tablet By Mouth Once A Day 5)  Hydrochlorothiazide 25 Mg Tabs (Hydrochlorothiazide) .Marland Kitchen.. 1 By Mouth Once Daily in Am 6)  Omeprazole 20 Mg Cpdr (Omeprazole) .Marland Kitchen.. 1 By Mouth Once Daily in The Am As Needed  Allergies (verified): 1)  Penicillin 2)  Macrodantin 3)  Ace Inhibitors 4)  Fosamax   PPM Specifications Following MD:  Lewayne Bunting, MD     PPM Vendor:  [employer]     PPM Model Number:  [id number]     PPM Serial Number:  [id number] PPM DOI:  01/07/2008     PPM Implanting MD:  Lewayne Bunting, MD  Lead 1    Location: RA     DOI: 01/07/2008     Model #: [id number]     Serial #: [id number]     Status: active Lead 2    Location: RV     DOI: 01/07/2008     Model #: [id number]     Serial #: [id number]     Status: active  Magnet Response Rate:  BOL 85 ERI 65  Indications:  Symptomatic Bradycardia   PPM Follow Up Remote Check?  No Battery Voltage:  2.78 V     Battery Est. Longevity:  9.5 years     Pacer Dependent:  Yes       PPM Device Measurements Atrium  Impedance: 451 ohms, Threshold: 0.75 V at 0.4 msec Right Ventricle  Amplitude: 8.0 mV, Impedance: 596 ohms, Threshold: 0.875 V at 0.4 msec  Episodes MS Episodes:  10     Percent Mode Switch:  <0.1%     Coumadin:  No Ventricular High Rate:  7     Atrial Pacing:  98.2%     Ventricular Pacing:  2.5%  Parameters Mode:  DDDR     Lower Rate Limit:  55     Upper Rate Limit:  120 Paced AV Delay:  250     Sensed AV Delay:  230 Rate Response Parameters:  ADL rate setpoint ->8 Next Remote Date:  06/21/2010     Next Cardiology Appt Due:  12/25/2010 Tech Comments:  10 mode switch episodes = <0.1%, -coumadin.  1  VHR episodes the longest 6 seconds that appears to be at the time of mode switch.  Rate response reprogrammed as above.  Carelink transmissions every 3 months.  ROV 5/12 with Dr. [name] in [place]. Altha Harm, LPN  March 21, 2010 11:30 AM

## 2010-09-25 NOTE — Assessment & Plan Note (Signed)
Summary: ROA FOR 6 MTH-UP/JRR R/S FROM 12/6   Vital Signs:  Patient profile:   77 year old female Height:      63 inches Weight:      155.75 pounds BMI:     27.69 Temp:     98.3 degrees F oral Pulse rate:   80 / minute Pulse rhythm:   regular BP sitting:   130 / 80  (left arm) Cuff size:   regular  Vitals Entered By: Lewanda Rife LPN (August 01, 2010 12:01 PM) CC: six month f/u   History of Present Illness: here for f/u of lipids and HTN and vit D def  just had a big birthday -- had a great time   wt is stable   HTN in good control with 130/80 today  is good for her   Last Lipid ProfileCholesterol: 232 (07/25/2010 8:47:16 AM)HDL:  58.90 (07/25/2010 8:47:16 AM)LDL:  127 (09/18/2007 9:07:00 AM)Triglycerides:  Last Liver profileSGOT:  21 (07/25/2010 8:47:16 AM)SPGT:  15 (07/25/2010 8:47:16 AM)T. Bili:  1.2 (07/19/2009 10:05:05 AM)Alk Phos:  59 (07/19/2009 10:05:05 AM)   LDL down from 170s to 140s which is good  does eat high fiber cereal  really does avoid fried foods  lots of fruit and veggies  does eat eggs -- egg beaters without the yolk   occ wine    vit D low again at 25 she takes vit D every day -- only 1000   Allergies: 1)  Penicillin 2)  Macrodantin 3)  Ace Inhibitors 4)  Fosamax  Past History:  Past Medical History: Last updated: 02/01/2010 Allergic rhinitis Anxiety Depression GERD Hypertension Osteopenia bradycardia-- pacemaker 5/09 hyperlipidemia   Past Surgical History: Last updated: 01/20/2008 Appendectomy Hysterectomy- total, fibroids Tonsillectomy Ankle fracture- surgery (05/2002) Duodenal ulcer- tx'd for H pylori Elbow fracture 906/2006) MRI head- age related ischemic changes and volume loss (05/2005) Dexa- osteopenia femoral neck T-1.85 (03/2006) 5/09 pacemaker implantation   Family History: Last updated: 01/09/2009 Father: MI age 65 Mother: died from pneumonia age 55, had alzheimers, dep Siblings: brother with septal defect  and CABG x 3, another brother died as a child GM bladder ca  Social History: Last updated: 06/27/2008 Marital Status: Married Children: 2 Occupation: retired Never Smoked alcohol- infrequently  Risk Factors: Smoking Status: never (11/11/2007)  Review of Systems General:  Denies fatigue, loss of appetite, and malaise. Eyes:  Denies blurring and eye irritation. CV:  Denies chest pain or discomfort, palpitations, shortness of breath with exertion, and swelling of feet. Resp:  Denies cough and wheezing. GI:  Denies abdominal pain, change in bowel habits, indigestion, and nausea. GU:  Denies dysuria and urinary frequency. MS:  Denies joint pain, joint redness, joint swelling, muscle aches, and cramps. Derm:  Denies itching, lesion(s), poor wound healing, and rash. Neuro:  Denies numbness and tingling. Psych:  mood is good overall . Endo:  Denies cold intolerance, excessive thirst, excessive urination, and heat intolerance. Heme:  Denies abnormal bruising and bleeding.  Physical Exam  General:  well appearing elderly female Head:  normocephalic, atraumatic, and no abnormalities observed.   Eyes:  vision grossly intact, pupils equal, pupils round, and pupils reactive to light.  no conjunctival pallor, injection or icterus  Mouth:  pharynx pink and moist.   Neck:  supple with full rom and no masses or thyromegally, no JVD or carotid bruit  Chest Wall:  No deformities, masses, or tenderness noted. Lungs:  Normal respiratory effort, chest expands symmetrically. Lungs are clear to auscultation,  no crackles or wheezes. Heart:  Normal rate and regular rhythm. S1 and S2 normal without gallop, murmur, click, rub or other extra sounds. Abdomen:  Bowel sounds positive,abdomen soft and non-tender without masses, organomegaly or hernias noted. no renal bruits Msk:  No deformity or scoliosis noted of thoracic or lumbar spine.   Pulses:  R and L posterior tibial pulses are full and equal  bilaterally  Extremities:  No clubbing, cyanosis, edema, or deformity noted with normal full range of motion of all joints.   some varicosities  Neurologic:  sensation intact to light touch, gait normal, and DTRs symmetrical and normal.   Skin:  Intact without suspicious lesions or rashes Cervical Nodes:  No lymphadenopathy noted Psych:  normal affect, talkative and pleasant    Impression & Recommendations:  Problem # 1:  UNSPECIFIED VITAMIN D DEFICIENCY (ICD-268.9) Assessment Unchanged level 25 disc imp of this for bone health 50,000 international units weekly for 12 wk then adv after labs  Problem # 2:  Hx of HYPERCHOLESTEROLEMIA (ICD-272.0) Assessment: Improved  improved with better diet pt ref statins  enc good low sat fat choices enc being active  Labs Reviewed: SGOT: 21 (07/25/2010)   SGPT: 15 (07/25/2010)  Prior 10 Yr Risk Heart Disease: 15 % (03/06/2009)   HDL:58.90 (07/25/2010), 56.00 (01/29/2010)  LDL:127 (09/18/2007), DEL (06/17/2007)  Chol:232 (07/25/2010), 260 (01/29/2010)  Trig:87.0 (07/25/2010), 123.0 (01/29/2010)  Problem # 3:  HYPERTENSION (ICD-401.9) Assessment: Unchanged  good control without change rev labs Her updated medication list for this problem includes:    Diovan 160 Mg Tabs (Valsartan) ..... One by mouth daily    Hydrochlorothiazide 25 Mg Tabs (Hydrochlorothiazide) .Marland Kitchen... 1 by mouth once daily in am  BP today: 130/80 Prior BP: 142/88 (02/01/2010)  Prior 10 Yr Risk Heart Disease: 15 % (03/06/2009)  Labs Reviewed: K+: 4.8 (07/25/2010) Creat: : 1.1 (07/25/2010)   Chol: 232 (07/25/2010)   HDL: 58.90 (07/25/2010)   LDL: 127 (09/18/2007)   TG: 87.0 (07/25/2010)  Complete Medication List: 1)  Zoloft 100 Mg Tabs (Sertraline hcl) .... One by mouth once daily 2)  Diovan 160 Mg Tabs (Valsartan) .... One by mouth daily 3)  Ibuprofen 800 Mg Tabs (Ibuprofen) .... One by mouth once daily as needed- always with food 4)  Centrum Silver Tabs (Multiple  vitamins-minerals) .... Take 1 tablet by mouth once a day 5)  Hydrochlorothiazide 25 Mg Tabs (Hydrochlorothiazide) .Marland Kitchen.. 1 by mouth once daily in am 6)  Vitamin D (ergocalciferol) 50000 Unit Caps (Ergocalciferol) .Marland Kitchen.. 1 by mouth once weekly for 12 weeks  Patient Instructions: 1)  the current recommendation for calcium intake is 1200-1500 mg daily with1000 IU of vitamin D  2)  take the 12 week course of weekly vitamin D on top of the regular calcium and vitamin D you are supposed to take  3)  cholesterol is better  4)  blood pressure is good  5)  check vitamin D level in 12 weeks 6)  follow up with me in 6 months  Prescriptions: HYDROCHLOROTHIAZIDE 25 MG TABS (HYDROCHLOROTHIAZIDE) 1 by mouth once daily in am  #90 x 3   Entered and Authorized by:   Judith Part MD   Signed by:   Judith Part MD on 08/01/2010   Method used:   Print then Give to Patient   RxID:   [id number] IBUPROFEN 800 MG  TABS (IBUPROFEN) one by mouth once daily as needed- always with food  #90 x 3   Entered and Authorized  by:   Judith Part MD   Signed by:   Judith Part MD on 08/01/2010   Method used:   Print then Give to Patient   RxID:   [id number] DIOVAN 160 MG  TABS (VALSARTAN) one by mouth daily  #90 x 3   Entered and Authorized by:   Judith Part MD   Signed by:   Judith Part MD on 08/01/2010   Method used:   Print then Give to Patient   RxID:   [id number] ZOLOFT 100 MG  TABS (SERTRALINE HCL) one by mouth once daily  #90 x 3   Entered and Authorized by:   Judith Part MD   Signed by:   Judith Part MD on 08/01/2010   Method used:   Print then Give to Patient   RxID:   [id number] VITAMIN D (ERGOCALCIFEROL) 50000 UNIT CAPS (ERGOCALCIFEROL) 1 by mouth once weekly for 12 weeks  #12 x 0   Entered and Authorized by:   Judith Part MD   Signed by:   Judith Part MD on 08/01/2010   Method used:   Print then Give to Patient   RxID:    [id number]    Orders Added: 1)  Est. Patient Level IV [63875]    Current Allergies (reviewed today): PENICILLIN MACRODANTIN ACE INHIBITORS FOSAMAX

## 2010-09-25 NOTE — Cardiovascular Report (Signed)
Summary: Office Visit Remote   Office Visit Remote   Imported By: Roderic Ovens 10/23/2009 11:11:19  _____________________________________________________________________  External Attachment:    Type:   Image     Comment:   External Document

## 2010-09-25 NOTE — Letter (Signed)
Summary: Remote Device Check  [facility], Main Office  [address]   [place], [place] [address]   Phone: [phone]  Fax: [phone]     October 20, 2009 MRN: [id number]   [facility] [address] [place], [place]  [address]   Dear Ms. Herman,   Your remote transmission was recieved and reviewed by your physician.  All diagnostics were within normal limits for you.    ___X___Your next office visit is scheduled for:        MAY 2011 WITH DR [name]. Please call our [place] office [phone] to schedule an appointment.    Sincerely,  Proofreader

## 2010-09-25 NOTE — Procedures (Signed)
Summary: N5A   CC:  Device Check.  History of Present Illness: [name] [name] returns today for followup.  She is a pleasant 77 yo woman with a h/o symptomatic bradycardia and HTN.  She is s/p PPM.  She denies c/p, sob or peripheral edema. She is active in her garden and is able to accomplish all the activities of daily living.  Current Medications (verified): 1)  Zoloft 100 Mg  Tabs (Sertraline Hcl) .... One By Mouth Once Daily 2)  Diovan 160 Mg  Tabs (Valsartan) .... One By Mouth Daily 3)  Ibuprofen 800 Mg  Tabs (Ibuprofen) .... One By Mouth Once Daily As Needed- Always With Food 4)  Centrum Silver   Tabs (Multiple Vitamins-Minerals) .... Take 1 Tablet By Mouth Once A Day 5)  Hydrochlorothiazide 25 Mg Tabs (Hydrochlorothiazide) .Marland Kitchen.. 1 By Mouth Once Daily in Am 6)  Omeprazole 20 Mg Cpdr (Omeprazole) .Marland Kitchen.. 1 By Mouth Once Daily in The Am As Needed  Allergies: 1)  Penicillin 2)  Macrodantin 3)  Ace Inhibitors 4)  Fosamax  Past History:  Past Medical History: Last updated: 01/20/2008 Allergic rhinitis Anxiety Depression GERD Hypertension Osteopenia bradycardia-- pacemaker 5/09  Past Surgical History: Last updated: 01/20/2008 Appendectomy Hysterectomy- total, fibroids Tonsillectomy Ankle fracture- surgery (05/2002) Duodenal ulcer- tx'd for H pylori Elbow fracture 906/2006) MRI head- age related ischemic changes and volume loss (05/2005) Dexa- osteopenia femoral neck T-1.85 (03/2006) 5/09 pacemaker implantation   Review of Systems  The patient denies chest pain, syncope, dyspnea on exertion, and peripheral edema.    Vital Signs:  Patient profile:   77 year old female Height:      63 inches Weight:      157 pounds BMI:     27.91 Pulse rate:   61 / minute BP sitting:   142 / 50  (right arm) Cuff size:   regular  Vitals Entered By: Stanton Kidney, EMT-P (Dec 25, 2009 4:11 PM)  Physical Exam  General:  Well-developed,well-nourished,in no acute distress;  alert,appropriate and cooperative throughout examination Head:  normocephalic, atraumatic, and no abnormalities observed.   Eyes:  vision grossly intact, pupils equal, pupils round, and pupils reactive to light.   Mouth:  pharynx pink and moist.   Neck:  supple with full rom and no masses or thyromegally, no JVD or carotid bruit  Chest Wall:  Well healed PPM incision. Lungs:  Normal respiratory effort, chest expands symmetrically. Lungs are clear to auscultation, no crackles or wheezes. Heart:  Normal rate and regular rhythm. S1 and S2 normal without gallop, murmur, click, rub or other extra sounds. Abdomen:  Bowel sounds positive,abdomen soft and non-tender without masses, organomegaly or hernias noted. no renal bruits Msk:  No deformity or scoliosis noted of thoracic or lumbar spine.   Pulses:  R and L posterior tibial pulses are full and equal bilaterally  Extremities:  No clubbing, cyanosis, edema, or deformity noted with normal full range of motion of all joints.   Neurologic:  Alert and oriented x 3.   PPM Specifications Following MD:  [name] Bunting, MD     PPM Vendor:  [employer]     PPM Model Number:  [id number]     PPM Serial Number:  [id number] PPM DOI:  01/07/2008     PPM Implanting MD:  [name] Bunting, MD  Lead 1    Location: RA     DOI: 01/07/2008     Model #: [id number]     Serial #: [id number]  Status: active Lead 2    Location: RV     DOI: 01/07/2008     Model #: [id number]     Serial #: [id number]     Status: active   Indications:  Symptomatic Bradycardia   PPM Follow Up Remote Check?  No Battery Voltage:  2.79 V     Battery Est. Longevity:  7 years     Pacer Dependent:  Yes       PPM Device Measurements Atrium  Impedance: 464 ohms, Threshold: 0.875 V at 0.4 msec Right Ventricle  Amplitude: 8.0 mV, Impedance: 533 ohms, Threshold: 0.875 V at 0.4 msec  Episodes MS Episodes:  35     Percent Mode Switch:  <0.1%     Coumadin:  No Ventricular High Rate:  20     Atrial Pacing:  97.1%      Ventricular Pacing:  3.4%  Parameters Mode:  DDDR     Lower Rate Limit:  55     Upper Rate Limit:  120 Paced AV Delay:  250     Sensed AV Delay:  230 Next Remote Date:  03/29/2010     Next Cardiology Appt Due:  12/25/2010 Tech Comments:  No parameter changes.  Device function normal.  20 VHR episodes lasting up to 7 seconds with a total of 468,258 single PVC's.  Carelink transmissions every 3 months.  ROV 1 year with Dr. [name] in [place]. Altha Harm, LPN  Dec 25, 1912 4:17 PM  MD Comments:  Agree with above.  Impression & Recommendations:  Problem # 1:  CARDIAC PACEMAKER IN SITU (ICD-V45.01) Her PPM is working normally.  Will recheck in several months.  Problem # 2:  PREMATURE VENTRICULAR CONTRACTIONS (ICD-427.69) These are very frequent but asymptomatic.  Will followup. She does not have any CHF symptoms with her PVC's.  Problem # 3:  HYPERTENSION (ICD-401.9) Her blood pressure has been reasonably well controlled. Her updated medication list for this problem includes:    Diovan 160 Mg Tabs (Valsartan) ..... One by mouth daily    Hydrochlorothiazide 25 Mg Tabs (Hydrochlorothiazide) .Marland Kitchen... 1 by mouth once daily in am

## 2010-09-25 NOTE — Cardiovascular Report (Signed)
Summary: Office Visit   Office Visit   Imported By: Roderic Ovens 03/27/2010 15:26:55  _____________________________________________________________________  External Attachment:    Type:   Image     Comment:   External Document

## 2010-09-27 NOTE — Assessment & Plan Note (Signed)
Summary: COUGH,CONGESTION/CLE   Vital Signs:  Patient profile:   77 year old female Weight:      157.50 pounds O2 Sat:      96 % on Room air Temp:     97.7 degrees F oral Pulse rate:   76 / minute Pulse rhythm:   regular BP sitting:   160 / 90  (left arm) Cuff size:   regular  Vitals Entered By: Sydell Axon LPN (August 16, 2010 3:15 PM)  O2 Flow:  Room air CC: Head and chest congestion X 2 weeks, productive cough/white   History of Present Illness: This began 2 days after seeing Dr Milinda Antis. Started with ST and rhinitis. He throat is now nml and she is not coughing except when she feels she needs to bring up something. Her rhinitis and cough is productive of thick white/cream. She denies fever or chills, she has no headache, no ear pain, has runny nose as above, breathing was wheezy a few days ago but now better, ST now resolved and cough productive of white sputum. She takes IBP occas, none today but took it yesterday. No N/V, no stomach pain but has more gas than usual. BP typically runs 130/90/.  Problems Prior to Update: 1)  Premature Ventricular Contractions  (ICD-427.69) 2)  Cardiac Pacemaker in Situ  (ICD-V45.01) 3)  Intermittent Vertigo  (ICD-780.4) 4)  Gerd  (ICD-530.81) 5)  Ganglion Cyst  (ICD-727.43) 6)  Osteoarthritis  (ICD-715.90) 7)  Unspecified Vitamin D Deficiency  (ICD-268.9) 8)  Other Screening Mammogram  (ICD-V76.12) 9)  Screening For Malignant Neoplasm, Colon  (ICD-V76.51) 10)  Hx of Venous Insufficiency  (ICD-459.81) 11)  Hemorrhoids  (ICD-455.6) 12)  Hx of Pericardial Effusion  (ICD-423.9) 13)  Murmur  (ICD-785.2) 14)  Bradycardia  (ICD-427.89) 15)  Hx of Hypercholesterolemia  (ICD-272.0) 16)  Osteopenia  (ICD-733.90) 17)  Hypertension  (ICD-401.9) 18)  Gerd  (ICD-530.81) 19)  Depression  (ICD-311) 20)  Anxiety  (ICD-300.00) 21)  Allergic Rhinitis  (ICD-477.9)  Medications Prior to Update: 1)  Zoloft 100 Mg  Tabs (Sertraline Hcl) .... One By  Mouth Once Daily 2)  Diovan 160 Mg  Tabs (Valsartan) .... One By Mouth Daily 3)  Ibuprofen 800 Mg  Tabs (Ibuprofen) .... One By Mouth Once Daily As Needed- Always With Food 4)  Centrum Silver   Tabs (Multiple Vitamins-Minerals) .... Take 1 Tablet By Mouth Once A Day 5)  Hydrochlorothiazide 25 Mg Tabs (Hydrochlorothiazide) .Marland Kitchen.. 1 By Mouth Once Daily in Am 6)  Vitamin D (Ergocalciferol) 50000 Unit Caps (Ergocalciferol) .Marland Kitchen.. 1 By Mouth Once Weekly For 12 Weeks  Current Medications (verified): 1)  Zoloft 100 Mg  Tabs (Sertraline Hcl) .... One By Mouth Once Daily 2)  Diovan 160 Mg  Tabs (Valsartan) .... One By Mouth Daily 3)  Ibuprofen 800 Mg  Tabs (Ibuprofen) .... One By Mouth Once Daily As Needed- Always With Food 4)  Centrum Silver   Tabs (Multiple Vitamins-Minerals) .... Take 1 Tablet By Mouth Once A Day 5)  Hydrochlorothiazide 25 Mg Tabs (Hydrochlorothiazide) .Marland Kitchen.. 1 By Mouth Once Daily in Am 6)  Vitamin D (Ergocalciferol) 50000 Unit Caps (Ergocalciferol) .Marland Kitchen.. 1 By Mouth Once Weekly For 12 Weeks  Allergies: 1)  Penicillin 2)  Macrodantin 3)  Ace Inhibitors 4)  Fosamax  Physical Exam  General:  well appearing elderly female, mildly congested in the chest. Head:  normocephalic, atraumatic, and no abnormalities observed.  Sinuses NT. Eyes:  Conjunctiva clear bilaterally.  Ears:  R ear  normal and L ear normal.   Nose:  no nasal discharge seen . Mild inflamm of the nostrils. Mouth:  pharynx pink and moist.  Min clear thick PND. Neck:  supple with full rom and no masses or thyromegally, no JVD or carotid bruit  Lungs:  Min ronchi in LUL, improves with cough, R lung field clear.  Heart:  Normal rate and regular rhythm. S1 and S2 normal without gallop, murmur, click, rub or other extra sounds.    Impression & Recommendations:  Problem # 1:  URI (ICD-465.9) Assessment New  with mild bronchitis. See instructions. Discussed cautious use of IBP.  Her updated medication list for this  problem includes:    Ibuprofen 800 Mg Tabs (Ibuprofen) ..... One by mouth once daily as needed- always with food  Instructed on symptomatic treatment. Call if symptoms persist or worsen.   Complete Medication List: 1)  Zoloft 100 Mg Tabs (Sertraline hcl) .... One by mouth once daily 2)  Diovan 160 Mg Tabs (Valsartan) .... One by mouth daily 3)  Ibuprofen 800 Mg Tabs (Ibuprofen) .... One by mouth once daily as needed- always with food 4)  Centrum Silver Tabs (Multiple vitamins-minerals) .... Take 1 tablet by mouth once a day 5)  Hydrochlorothiazide 25 Mg Tabs (Hydrochlorothiazide) .Marland Kitchen.. 1 by mouth once daily in am 6)  Vitamin D (ergocalciferol) 50000 Unit Caps (Ergocalciferol) .Marland Kitchen.. 1 by mouth once weekly for 12 weeks  Patient Instructions: 1)  Take Guaifenesin by going to [facility] or [facility] and getting MUCOUS RELIEF EXPECTORANT (400mg ), take 11/2 tabs by mouth AM and NOON for 4-5 days.Marland Kitchen 2)  Drink lots of fluids anytime taking Guaifenesin.  3)  Take Tyl ES 2 tabs three times a day for 3-4 days. 4)  If no impr by Mon, call for poss Abs. 5)  If worsens, be seen immediately. 6)  Use care taking IBP...discussed.   Orders Added: 1)  Est. Patient Level III [17616]    Current Allergies (reviewed today): PENICILLIN MACRODANTIN ACE INHIBITORS FOSAMAX

## 2010-10-24 ENCOUNTER — Encounter (INDEPENDENT_AMBULATORY_CARE_PROVIDER_SITE_OTHER): Payer: Self-pay | Admitting: *Deleted

## 2010-10-24 ENCOUNTER — Encounter: Payer: Self-pay | Admitting: Family Medicine

## 2010-10-24 ENCOUNTER — Other Ambulatory Visit (INDEPENDENT_AMBULATORY_CARE_PROVIDER_SITE_OTHER): Payer: Medicare Other

## 2010-10-24 DIAGNOSIS — E559 Vitamin D deficiency, unspecified: Secondary | ICD-10-CM

## 2010-10-26 ENCOUNTER — Encounter (INDEPENDENT_AMBULATORY_CARE_PROVIDER_SITE_OTHER): Payer: Self-pay | Admitting: *Deleted

## 2010-11-01 NOTE — Miscellaneous (Signed)
  Clinical Lists Changes  Medications: Removed medication of VITAMIN D (ERGOCALCIFEROL) 50000 UNIT CAPS (ERGOCALCIFEROL) 1 by mouth once weekly for 12 weeks Added new medication of VITAMIN D 1000 UNIT  TABS (CHOLECALCIFEROL) Take 5,000 international units daily.

## 2011-01-08 ENCOUNTER — Telehealth: Payer: Self-pay | Admitting: *Deleted

## 2011-01-08 MED ORDER — MECLIZINE HCL 25 MG PO TABS: 25.0000 mg | ORAL_TABLET | Freq: Three times a day (TID) | ORAL | Status: AC | PRN

## 2011-01-08 NOTE — Telephone Encounter (Signed)
Px written for call in  Use caution for sedation F/u if not improved or any new symptoms

## 2011-01-08 NOTE — Discharge Summary (Signed)
NAME:  GEROLDINE, ESQUIVIAS          ACCOUNT NO.:  [id number]   MEDICAL RECORD NO.:  [id number]          PATIENT TYPE:  OIB   LOCATION:  4707                         FACILITY:  [facility]   PHYSICIAN:  [name]. [name], MD    DATE OF BIRTH:  06-12-1923   DATE OF ADMISSION:  01/07/2008  DATE OF DISCHARGE:  01/08/2008                               DISCHARGE SUMMARY   ALLERGIES:  This patient has allergies to PENICILLIN, MACRODANTIN, and  ACE INHIBITORS.   Time for this dictation, exam, and discussion with the patient, greater  than 35 minutes.   FINAL DIAGNOSES:  1. Discharging day 1, status post implant of a Medtronic SENSIA, dual-      chamber pacemaker.  2. The patient has symptomatic bradycardia:  Fatigue/weakness.   SECONDARY DIAGNOSES:  1. Hypertension.  2. A 2D echocardiogram, Dec 30, 2007, ejection fraction 55%-60%, no      left ventricular wall motion abnormalities, mild aortic valve      regurgitation.   PROCEDURE:  On Jan 07, 2008, implant of the Medtronic dual-chamber  pacemaker by Dr. Lewayne Bunting for symptomatic bradycardia.  The patient  has had no postprocedural complications.   BRIEF HISTORY:  Ms. Chinchilla is an 77 year old female.  She has a  longstanding history with Dr. [name], seen 9 years ago for consideration  of permanent pacemaker.  At that time, she was 70.  She had had a of  bradycardia for many years, but was asymptomatic, unable to do her usual  daily activities.   Two months ago, she had a severe bout of bronchitis.  It took a month to  get over and she has been fatigued and weak afterwards.  However, the  patient has had no frank syncope.  She had a monitor worn 1 year ago,  which showed an average heart rate of 50 beats per minute, low heart  rate in the low 30s and peak heart rate of 100 beats a minute   The patient is now older with fatigue and bradycardia.  It is  recommended that she proceed with pacemaker implantation.  Risks and  benefits have  been described to the patient, and she wishes to proceed.   HOSPITAL COURSE:  The patient presents electively on Jan 07, 2008, she  underwent implantation of the dual-chamber Medtronic SENSIA pacemaker by  Dr. [name].  The same day DDDR, the patient had no postprocedural  complications, the pacemaker has been interrogated postprocedure day #1,  all values within normal limits.  Chest x-ray shows that the leads are  in appropriate position with no pneumothorax.  The patient is slightly  hypertensive in the overnight, but she had not been getting her daily  blood pressure medication, which was Diovan 160, and this has been given  to her now.  The patient's incision looks good.  There is no hematoma or  mild ecchymosis at the base of the pocket.  The patient is not having  any pain there.  Discussion about mobility of the arm and incision care  have been given to the patient.  She will probably go home on Jan 08, 2008.  Chest x-ray shows no pneumothorax and leads are appropriate.   DISCHARGE MEDICATIONS:  The patient discharges on the following  medications:  1. Divan 160 mg daily.  2. Zoloft 100 mg daily.  3. Multivitamin daily.  4. Calcium tablet daily.   FOLLOWUP:  She follows up at [facility], the [address]  office, for pacer clinic, Monday, January 25, 2008, at 9:20.  She will see  Dr. [name] at the [facility] office on Monday, April 11, 2008, at 9:30.   LABORATORY STUDIES:  Pertinent to this admission were drawn on Dec 30, 2007, white cells 4.8, hemoglobin 13.6, hematocrit 42.8, and platelets  of 165.  Sodium is 145, potassium 4.3, chloride 108, carbonate 22,  glucose is 90, BUN is 18, and creatinine 0.82.  Protime is 14 and INR  1.1.      Maple Mirza, PA      [name]. [name], MD  Electronically Signed    GM/MEDQ  D:  01/08/2008  T:  01/08/2008  Job:  [id number]   cc:   Rollene Rotunda, MD, [facility]  [facility]  [place]. Milinda Antis, MD

## 2011-01-08 NOTE — Progress Notes (Signed)
[facility] OFFICE NOTE   NAME:[name] [name], [name] [name]                 MRN:          [id number]  DATE:01/09/2009                            DOB:          03/19/1923    [name] [name] returns today for pacemaker followup and followup of her  hypertension.  The patient is a very pleasant 77 year old woman with  long-standing sinus bradycardia and a sinus node dysfunction and  hypertension.  She underwent permanent pacemaker insertion about 1 year  ago.  Since then, she has felt better.  Her blood pressure, however, has  been elevated in the past.  She notes now that since I saw her in August  2009 she had a blood pressure cuff that averages about 130/90, though it  does go higher when she goes to see the doctor.  She has had no  peripheral edema.  She denies chest pain.  She denies shortness of  breath.  She has had no syncopal episodes and no palpitations.   Her medications include:  1. Zoloft 50 a day.  2. Aspirin 81 a day.  3. Calcium daily.  4. Diovan 160 a day.  5. Recently, she was started on hydrochlorothiazide, I do not have the      dose.   PHYSICAL EXAMINATION:  GENERAL:  She is a pleasant, well-appearing  elderly woman in no acute distress.  VITAL SIGNS:  Blood pressure was 155/96, the pulse 82 and regular, the  respirations were 18.  Weight was 158 pounds.  NECK:  No jugular venous distention.  LUNGS:  Clear bilaterally to auscultation.  No wheezes, rales, or  rhonchi are present.  There is no increased work of breathing.  CARDIAC:  A regular rate and rhythm.  Normal S1 and S2.  ABDOMEN:  Soft, nontender.  EXTREMITIES:  No edema.   Interrogation of her pacemaker demonstrates a Medtronic [place].  The  impedances were 464 in the A, 584 in the V.  The patient's P-waves were  low secondary to her underlying junctional rhythm.  The threshold was  0.75 at 0.4 in the A and 0.87 at 0.4 in the RV.   R-waves were greater  than 8 mV.   IMPRESSION:  1. Symptomatic bradycardia.  2. Status post pacemaker insertion.  3. Hypertension under fairly good control.   DISCUSSION:  Overall, [name] [name] is stable, and her pacemaker is  working normally.  Estimated longevity is approximately 8-9 years.  We  will see the patient back in the office for pacemaker followup in 1  year.     [name]. [name], MD  Electronically Signed    GWT/MedQ  DD: 01/09/2009  DT: 01/10/2009  Job #: [id number]   cc:   Marne A. Milinda Antis, MD

## 2011-01-08 NOTE — Progress Notes (Signed)
[name] OFFICE NOTE   NAME:[name], [name] COUSIN                 MRN:          [id number]  DATE:04/11/2008                            DOB:          10-Sep-1922    [name] [name] returns today for followup.  He is a very pleasant elderly  woman with a history of symptomatic bradycardia who we saw several  months ago and at that time, I recommend permanent pacemaker insertion.  This was carried out back in May and she returns today for followup.  She states that her energy level is much improved since her pacemaker  was placed and she is able to do more without getting SVT or weak.   CURRENT MEDICATIONS:  1. Diovan 160 twice a day.  2. Zoloft 50 a day.  3. Aspirin 81 a day.   PHYSICAL EXAMINATION:  GENERAL:  On physical exam, she is a pleasant,  well appearing elderly woman in no acute distress.  VITAL SIGNS:  Blood pressure was 164/90, the pulse was 86 and regular,  respirations were 18, weight was 161 pound, unchanged from her visit  back in April.  NECK:  Revealed no jugular venous distention.  LUNGS:  Clear bilaterally to auscultation.  No wheezes, rales, or  rhonchi were present.  CARDIOVASCULAR EXAM:  Regular rate and rhythm with normal S1 and S2.  There is soft S4 gallop.  ABDOMINAL EXAM:  Soft and nontender.  EXTREMITIES:  Demonstrated no edema.   The EKG demonstrates sinus rhythm with atrial pacing.   Interrogation of her pacemaker demonstrates Medtronic [place].  There are  no P-waves secondary to underlying sinus bradycardia.  Her R-waves were  8, the impedances were 492 in the A, 602 in the V, threshold 0.875 at  0.4 in the right atrium, 0.75 at 0.4 in the right ventricle.  Battery  voltage was 2.79 volts.  There are no mode switching episodes noted.  She was 97% A paced, 2% V paced.   IMPRESSION:  1. Symptomatic bradycardia.  2. Fatigue, now improved secondary to pacemaker  insertion.  3. Still poorly controlled hypertension.   DISCUSSION:  Overall, [name] [name] was stable, but I am a little  concerned about her blood pressure being elevated.  I have asked her to  buy a blood pressure cuff and to check on nearly daily basis.  With  regard to followup, I have asked that she followup with her primary  physician,  Dr. Roxy [name] for ongoing blood pressure valve and consideration for  additional antihypertensive therapy.  I will plan to see her back in  several months for pacemaker followup.     [name] [name]. [name] Ridgel, MD  Electronically Signed    GWT/MedQ  DD: 04/11/2008  DT: 04/12/2008  Job #: [id number]   cc:   [name] A. Milinda Antis, MD

## 2011-01-08 NOTE — Assessment & Plan Note (Signed)
[facility] OFFICE NOTE   NAME:Polson, KARREN NEWLAND                 MRN:          [id number]  DATE:03/18/2007                            DOB:          October 03, 1922    PRIMARY CARDIOLOGIST:  Rollene Rotunda, M.D.   PRIMARY CARE PHYSICIAN:  Marne A. Milinda Antis, M.D.   PATIENT PROFILE:  An 77 year old Caucasian female with a prior history  of asymptomatic bradycardia, who presents for followup.   PROBLEMS:  1. History of asymptomatic bradycardia      a.     Status post Holter monitoring demonstrating sinus rhythm       with sinus bradycardia and rare premature ventricular       contractions.  There was atrial ectopy and non-sustained atrial       tachyarrhythmia with frequent two to three second pauses during       periods of rest.  No symptoms were ever reported.  2. History of pericardial effusion with evaluation at [facility] in 1999.  3. Peptic ulcer disease.  4. Depression.  5. Allergies.  6. Status post hysterectomy.  7. Hypertension.   HISTORY OF PRESENT ILLNESS:  This 77 year old Caucasian female with a  prior history of asymptomatic sinus bradycardia, last seen in the clinic  in May 2008.  Since her last visit she has done well and has remained  active at home without symptoms or limitations.  She denies any pre-  syncope, syncope, lightheadedness, dizziness, chest pain, pressure or  shortness of breath.  She denies PND or orthopnea or edema.  She does  plenty of housework, as well as works out in her yard without  limitations.   ALLERGIES:  PENICILLIN, MACRODANTIN AND ACE INHIBITORS (cough).   MEDICATIONS:  1. Diovan 160 mg daily.  2. Zoloft 100 mg daily.  3. Multivitamin one daily.  4. Calcium supplement daily.   PHYSICAL EXAMINATION:  VITAL SIGNS:  Blood pressure 118/64, heart rate  56, respirations 16.  She is afebrile.  Her weight is 157 pounds, which  is stable since the last visit.  GENERAL:  A  pleasant white female, in no acute distress, awake, alert  and oriented x3.  HEENT:  Normal.  NECK:  No bruits or jugular venous distention.  LUNGS:  Respirations regular and unlabored.  Clear to auscultation.  HEART:  Regular S1 and S2.  No S3 or S4 or murmurs.  ABDOMEN:  Round, soft, nontender, non-distended.  Bowel sounds present.  EXTREMITIES:  Warm and dry, pink.  No clubbing, cyanosis or edema.  Dorsalis pedis, posterior tibial pulses 2+ and equal bilaterally.   ACCESSORY CLINICAL FINDINGS:  None.   ASSESSMENT/PLAN:  1. Asymptomatic bradycardia:  The patient has had no symptoms.  We      will continue to follow.  Encouraged her to remain active.  2. Hypertension, under good control on ARB therapy:  Notably she was      elevated the last time she was seen in May and there was the      recommendation for possibly adding hydrochlorothiazide; however,      this appears not  to have been necessary.  3. Depression:  This is followed by Dr. Idamae Schuller A. Tower.  The patient      takes Zoloft.   DISPOSITION:  The patient will follow up with Dr. Rollene Rotunda in six  months, or sooner if necessary.      Nicolasa Ducking, ANP  Electronically Signed      Rollene Rotunda, MD, [facility]  Electronically Signed   CB/MedQ  DD: 03/18/2007  DT: 03/18/2007  Job #: [id number]

## 2011-01-08 NOTE — Assessment & Plan Note (Signed)
[facility] OFFICE NOTE   NAME:[name], [name] DUNNAVANT                 MRN:          [id number]  DATE:12/31/2006                            DOB:          10/19/1922    PRIMARY CARE PHYSICIAN:  [name] A. Tower, MD   REASON FOR PRESENTATION:  Evaluate patient with bradycardia.   HISTORY OF PRESENT ILLNESS:  The patient presents for followup. At the  last visit, she was referred for evaluation of her bradycardia. She was  not having any symptoms particularly related to this. She had episodic  fatigue with some days being better than others. She had no pre-syncope  or syncope. She had occasional shortness of breath with moderate  activity, but no resting symptoms. She had no chest pain. She has had no  change in any of these symptoms. I did have her wear a Holter. This  demonstrated sinus rhythm, sinus bradycardia. There were rare PVCs.  There was atrial ectopy and atrial tachy arrhythmia non-sustained. She  had frequent 2 to 3 second pauses mostly during the resting hours. There  were no symptoms reported.   PAST MEDICAL HISTORY:  1. Pericardiac effusion worked up at [facility] in the past and followed in      1999 with echocardiogram (was found to be small).  2. History of peptic ulcer disease.  3. Depression.  4. Bradycardia (she had seen Dr.  Ladona [name] in the past for similar      finding).  5. Hysterectomy.   ALLERGIES:  PENICILLIN, MACRODANTIN, ACE INHIBITOR CAUSED A COUGH.   MEDICATIONS:  1. Diovan 160 mg daily.  2. Zoloft 100 mg daily.  3. Multivitamin.  4. Calcium.   REVIEW OF SYSTEMS:  As stated in the HPI and otherwise negative for  other systems.   PHYSICAL EXAMINATION:  The patient is in no distress. Blood pressure  188/80, heart rate 60 and regular. Weight is 157 pounds.  HEENT: Eyes unremarkable. Pupils equal, round, and reactive to light.  Fundi not visualized. Oral mucosa is unremarkable.  NECK:  No jugular venous distention at 45 degrees. Carotid upstroke brisk  and symmetrical. No bruits, no thyromegaly.  LYMPHATICS: No adenopathy.  LUNGS: Clear to auscultation bilaterally.  HEART: PMI not displaced or sustained. S1, S2 within normal limits. No  S3. No S4. No clicks, rubs, or murmurs.  ABDOMEN: Flat, positive bowel sounds, normal in frequency and pitch. No  bruits. No rebounds. No guarding. No midline pulsatile mass. No  hepatomegaly, splenomegaly.  SKIN: No rashes, no nodules.  EXTREMITIES: 2+ pulses. No edema.   ASSESSMENT/PLAN:  1. Bradycardia: We had another long discussion about this. She still      is not having any symptoms. She wants to avoid a pacemaker. She has      no absolute indication for this. Therefore, we will watch her. She      understands that she needs to let me know if she has any pre-      syncope, increased fatigue, dyspnea, syncope or other symptoms that      could be related to her bradyarrhythmia.  2.  Hypertension: Blood pressure is slightly elevated today, but it was      not previously. This is followed closely by Dr.  Milinda [name]. She can      present to Dr.  Milinda [name] and might have hydrochlorothiazide to her      Diovan if she remains hypertensive.  3. Followup. I will see her back in 3 months or sooner if needed.     Rollene Rotunda, MD, [facility]     JH/MedQ  DD: 12/31/2006  DT: 12/31/2006  Job #: [id number]   cc:   [name] A. [name] Antis, MD

## 2011-01-08 NOTE — Op Note (Signed)
NAME:  TIONNE, CARELLI          ACCOUNT NO.:  [id number]   MEDICAL RECORD NO.:  [id number]          PATIENT TYPE:  OIB   LOCATION:  4707                         FACILITY:  [facility]   PHYSICIAN:  [name]. [name], MD    DATE OF BIRTH:  [birth year]   DATE OF PROCEDURE:  01/07/2008  DATE OF DISCHARGE:                               OPERATIVE REPORT   PROCEDURE PERFORMED:  Implantation of a dual-chamber pacemaker.   INDICATIONS:  Symptomatic bradycardia with hypertension.   INTRODUCTION:  The patient is an 84-year woman.  I initially saw her  several years ago with bradycardia.  However, her symptoms have  increased in her frequency and her bradycardia has worsened.  She is now  referred for pacemaker implantation, secondary to symptomatic  bradycardia.   PROCEDURE:  After informed obtained, the patient taken to diagnostic EP  lab in a fasting state.  After usual preparation and draping,  intravenous fentanyl and Valium were given for sedation.  30 mL of  lidocaine was infiltrated over the left infraclavicular region.  A 5-cm  incision was carried out over this region.  Electrocautery was utilized  to dissect down to the fascial plane.  The left subclavian vein was  punctured x2 and the Medtronic model 5076-52-cm active fixation pacing  lead serial #[id number] was advanced into the right ventricle.  Next,  the Medtronic model 5076-45-cm active-fixation pacing lead, serial  [id number] was advanced through right atrium.  Mapping was carried out  into the right ventricle and the right atrium.  At the final site, the R-  waves measured 10 mV and the pacing impedance was 711 ohms, the  threshold in the ventricle was 0.7-V at 0.5 msec with the lead actively  fixed.  A 10-V pacing did not stimulate the diaphragm.  With the  ventricular lead in satisfactory position, attention was then turned for  placement of the atrial lead, which was placed in the anterolateral  portion of the right atrium  where P-waves measured 3 mV and the pacing  impedance was 497 ohms.  The threshold was again 0.6-V at 0.5 msec and  10-V pacing again did not stimulate the diaphragm.  With both atrial and  ventricular leads in satisfactory position, they were secured to  subpectoralis fascia with a figure-of-eight silk suture.  The sewing  sleeve was also secured with silk suture.  Electrocautery was then  utilized to make a subcutaneous pocket.  Electrocautery was utilized to  assure hemostasis.  Kanamycin irrigation was utilized to irrigate the  pocket.  The Medtronic Sensia dual-chamber pacemaker serial [id number]  was connected to the atrial and ventricular leads and placed back in the  subcutaneous pocket.  Generator secured with silk suture.  Additional  kanamycin was utilized to irrigate the pocket.  The incision was closed  with layer of 2-0 Vicryl followed by layer of 3-0 Vicryl.  Benzoin was  painted on skin.  Steri-Strips were applied.  A pressure dressing was  placed, and the patient was returned to his room in satisfactory  condition.   COMPLICATIONS:  There were immediate procedure complications.   RESULTS:  Demonstrate  successful implantation of a Medtronic dual-  chamber pacemaker and the patient with symptomatic bradycardia.      [name]. [name], MD  Electronically Signed     GWT/MEDQ  D:  01/07/2008  T:  01/08/2008  Job:  [id number]   cc:   Marne A. Milinda Antis, MD

## 2011-01-08 NOTE — Telephone Encounter (Signed)
Rx called to pharmacy and patient notified. 

## 2011-01-08 NOTE — Assessment & Plan Note (Signed)
[facility]                         ELECTROPHYSIOLOGY OFFICE NOTE   NAME:[name] [name], [name] [name]                 MRN:          [id number]  DATE:12/14/2007                            DOB:          1923/06/24    [name] [name] is referred today by Dr. Milinda Antis.  She is a very pleasant 77-  year-old woman who I initially saw in the clinic 9 years ago, having  been referred for second opinion, for consideration of permanent  pacemaker insertion.  The patient at that time was 75 had bradycardia,  which she had had for many years.  She at that time had heart rates in  the mid-40s but was completely asymptomatic and able to do her usual  daily activities.  She has been stable since then, but notes that  approximately 2 months ago she had a severe bout of bronchitis, which  took her over a month to get over, and for several weeks she was  fatigued and weak afterwards.  Since then, she remains somewhat  weakened.  The patient has had no frank syncope in the meantime.  She is  saw my partner, Angelina Sheriff, one year ago, and at that time a cardiac  monitor was obtained for 24 hours of which demonstrated an average heart  rate of 50 beats per minute, a low heart rate in the low 30s, and a peak  heart rate of 100 beats per minute.  She returns today for additional  evaluation.   ADDITIONAL PAST MEDICAL HISTORY:  Is notable for hypertension.  She may  have a little osteoporosis.   FAMILY HISTORY:  Unremarkable at her advanced age.   SOCIAL HISTORY:  The patient is widowed.  She denies tobacco or ethanol  abuse.   REVIEW OF SYSTEMS:  Her review of systems is notable for fatigue and  weakness, as previously noted.  Otherwise, all systems were reviewed but  negative except as noted above.   PHYSICAL EXAMINATION:  GENERAL:  She is a pleasant elderly-appearing  woman in no acute distress.  VITAL SIGNS:  The blood pressure was 148/86, the pulse was 40 and  regular,  respirations were 18.  Weight was 161 pounds, really unchanged  in the last 9 years.  HEENT:  Normocephalic and atraumatic.  Pupils are equally round.  Oropharynx is moist.  Sclerae anicteric.  NECK:  Revealed no jugular distention and no thyromegaly.  Trachea is  midline, and carotids are 2+ and symmetric.  LUNGS:  The lungs were clear bilaterally to auscultation.  No wheezes,  rales or rhonchi are present.  No increased work of breathing.  CARDIAC:  Regular bradycardia with normal S1 and S2.  PMI was not enlarged nor  laterally displaced.  ABDOMEN:  Soft, nontender, nondistended.  There was no organomegaly.  Bowel sounds were present.  No rebound or guarding.  EXTREMITIES:  Demonstrated no cyanosis, clubbing or edema.  The pulses were 2+ and  symmetric.  NEUROLOGIC:  Alert and oriented x2, the cranial nerves intact.  Strength  is 5/5 and symmetric.  EKG demonstrates sinus bradycardia (very  profound, with her sinus rate actually less than  40 beats per minute).  She had several junctional escape beats at 38 beats per minute.   IMPRESSION:  1. Probable symptomatic bradycardia.  2. Hypertension.   DISCUSSION:  At this point at age 20 with fatigue and bradycardia, I  would recommend [name] [name] to proceed with pacemaker implantation.  The risks, benefits, goals and expectations of the procedure been  discussed with the patient in detail, and she wishes to proceed.  I will  schedule this as early as possible at a convenient time.     [name]. [name], MD  Electronically Signed    GWT/MedQ  DD: 12/14/2007  DT: 12/14/2007  Job #: [id number]   cc:   Marne A. Milinda Antis, MD

## 2011-01-08 NOTE — Telephone Encounter (Signed)
Patient is asking for a rx for meclizine to be called in to [facility]. She says that she has been dizzy for the past couple of days and she had this in Jan. Of last year and the meclizine really helped.

## 2011-01-11 NOTE — Assessment & Plan Note (Signed)
[facility]                            CARDIOLOGY OFFICE NOTE   NAME:[name] [name], [name] [name]                 MRN:          [id number]  DATE:11/25/2006                            DOB:          1923/03/12    REASON FOR VISIT:  Evaluate patient with bradycardia.   HISTORY OF PRESENT ILLNESS:  The patient was previously seen in 2000 by  Dr. [name]. She has had sinus bradycardia. However, she was not having  any symptoms and was managed conservatively at that time. Recently she  started having increased fatigue. She just described this as generalized  decrease in her tolerance for everyday chores. She said it was not  profound but rather subtle. She said she could have some shortness of  breath with moderate activity. She said around this time she had been  switched from brand to generic Zoloft. About 2-1/2-weeks ago she  restarted brand Zoloft and so she is now feeling better. She is now back  to almost her previous baseline from a year ago. She says she is able to  do her chores and she lives by herself so this includes vacuuming,  making beds, cooking. She does not have any limitations per her report.  She denies any presyncope or syncope. She has no light-headedness. She  has not noticed any palpitations. She has no chest discomfort, neck  discomfort or arm discomfort. She has no shortness of breath, PND or  orthopnea.   PAST MEDICAL HISTORY:  Pericardial effusion, worked up at [facility] in the  past (followed up in 1999  with an echocardiogram demonstrating it to be  small), history of peptic ulcer disease, depression.   PAST SURGICAL HISTORY:  Hysterectomy.   ALLERGIES:  PENICILLIN, MACRODANTIN, ACE INHIBITOR caused a cough.   MEDICATIONS:  1. Diovan 160 mg daily.  2. Zoloft 100 mg daily.  3. Multivitamin.  4. Calcium.   SOCIAL HISTORY:  The patient is now a widow. She has 2 children. She has  3 grandsons. She has no great grandchildren. She never  smoked cigarettes  and does not drink alcohol.   FAMILY HISTORY:  Noncontributory for early coronary artery disease or  sudden cardiac death.   REVIEW OF SYSTEMS:  As stated in the HPI and negative for all other  systems.   PHYSICAL EXAMINATION:  GENERAL:  The patient is well-appearing and in no  distress. She is slightly anxiety.  VITAL SIGNS:  Blood pressure 138/68, heart rate 48 and regular, body  mass index 27, weight 155 pounds.  HEENT:  Eyelids unremarkable. Pupils equal round and reactive to light.  Fundi not visualized. Oral mucosa unremarkable.  NECK:  No jugular venous distention, wave form within normal limits,  carotid upstroke brisk and symmetric, no bruits, no thyromegaly.  LYMPHATICS:  No cervical, axillary or inguinal adenopathy.  LUNGS:  Clear to auscultation bilaterally.  BACK:  No costovertebral angle tenderness.  CHEST:  Unremarkable.  HEART:  PMI not displaced or sustained, S1 and S2 within normal limits,  no S3, no S4, no clicks, no rubs, no murmurs.  ABDOMEN:  Flat, positive bowel sounds, normal to frequency  and pitch, no  bruits, no rebound, no guarding, no midline pulsatile mass, no  hepatomegaly, no splenomegaly.  SKIN:  No rashes, no nodules.  EXTREMITIES:  2+ pulses throughout, no edema, no cyanosis, no clubbing.  NEUROLOGIC:  Oriented to person, placed and time. Cranial nerves II-XII  grossly intact. Motor grossly intact.   ASSESSMENT/PLAN:  1. Bradycardia. The patient does not have any new symptoms that can      absolutely be related to this. She wants to avoid a pacemaker. At      this point, there is no absolute indications for one. I did have a      long discussion about this with her. In particular, I told her that      she needs to let us know if she is fatigued going forward as this      certainly could be attributed to her bradyarrhythmia. She says that      she will let us know about this or any problems in the future with      presyncope  or syncope. I assume she has had some recent labs to      include a thyroid profile and will look for these. Finally, the      patient does agree to wear a 24-hour monitor to look for any      profound bradyarrhythmias and to see if she has chronotropic      response.  2. Followup. I would like to see her back in 2 months or sooner but      will be reviewing the echo before that.     Rollene Rotunda, MD, [facility]  Electronically Signed    JH/MedQ  DD: 11/25/2006  DT: 11/25/2006  Job #: [id number]   cc:   Marne A. Milinda Antis, MD

## 2011-01-26 ENCOUNTER — Encounter: Payer: Self-pay | Admitting: Family Medicine

## 2011-01-31 ENCOUNTER — Encounter: Payer: Self-pay | Admitting: Internal Medicine

## 2011-01-31 ENCOUNTER — Ambulatory Visit (INDEPENDENT_AMBULATORY_CARE_PROVIDER_SITE_OTHER): Payer: Medicare Other | Admitting: Internal Medicine

## 2011-01-31 DIAGNOSIS — I10 Essential (primary) hypertension: Secondary | ICD-10-CM

## 2011-01-31 DIAGNOSIS — I4949 Other premature depolarization: Secondary | ICD-10-CM

## 2011-01-31 DIAGNOSIS — Z950 Presence of cardiac pacemaker: Secondary | ICD-10-CM

## 2011-01-31 DIAGNOSIS — I498 Other specified cardiac arrhythmias: Secondary | ICD-10-CM

## 2011-01-31 NOTE — Assessment & Plan Note (Signed)
Her device is working normally. We'll recheck in several months. 

## 2011-01-31 NOTE — Assessment & Plan Note (Signed)
Her blood pressure is not well controlled today. She states that at home is typically better. I discussed the possibility of adding additional blood pressure medications. We strongly considered starting amlodipine. The patient however was not inclined to add an additional medicine. I've encouraged her to maintain a very low-salt diet, to lose weight, and start walking more. She will follow up with her primary physician. I suspect that she will ultimately need more blood pressure lowering.

## 2011-01-31 NOTE — Assessment & Plan Note (Signed)
Review of her pacemaker interrogation demonstrates frequent PVCs but no sustained VT. She does not feel palpitations.

## 2011-01-31 NOTE — Patient Instructions (Addendum)
Your physician recommends that you schedule a follow-up appointment in: Send in remote transmission 05/02/11 Your physician recommends that you schedule a follow-up appointment in: 1 year with Dr. [name]

## 2011-01-31 NOTE — Progress Notes (Signed)
HPI  Allergies  Allergen Reactions  . Ace Inhibitors     REACTION: cough  . Alendronate Sodium     REACTION: aches  . Nitrofurantoin     REACTION: rash  . Penicillins     REACTION: rash     Current Outpatient Prescriptions  Medication Sig Dispense Refill  . cholecalciferol (VITAMIN D) 1000 UNITS tablet Take 5,000 Units by mouth daily.        . hydrochlorothiazide 25 MG tablet Take 25 mg by mouth daily.        Marland Kitchen ibuprofen (ADVIL,MOTRIN) 800 MG tablet Take 800 mg by mouth daily as needed. Always with food.      . meclizine (ANTIVERT) 25 MG tablet Take 1 tablet (25 mg total) by mouth 3 (three) times daily as needed for dizziness or nausea.  30 tablet  0  . Multiple Vitamin (MULTIVITAMIN) tablet Take 1 tablet by mouth daily.        . sertraline (ZOLOFT) 100 MG tablet Take 100 mg by mouth daily.        . valsartan (DIOVAN) 160 MG tablet Take 160 mg by mouth daily.           Past Medical History  Diagnosis Date  . Allergic rhinitis   . Anxiety   . Depression   . GERD (gastroesophageal reflux disease)   . Hypertension   . Osteopenia   . Bradycardia   . Hyperlipidemia   . Duodenal ulcer     ROS:   All systems reviewed and negative except as noted in the HPI.   Past Surgical History  Procedure Date  . Appendectomy   . Total abdominal hysterectomy   . Tonsillectomy   . Ankle fracture surgery 05/2002  . Insert / replace / remove pacemaker      Family History  Problem Relation Age of Onset  . Heart attack Father   . Depression Mother   . Alzheimer's disease Mother   . Pneumonia Mother   . Heart disease Brother     hx of CABG     History   Social History  . Marital Status: Married    Spouse Name: N/A    Number of Children: N/A  . Years of Education: N/A   Occupational History  . Not on file.   Social History Main Topics  . Smoking status: Never Smoker   . Smokeless tobacco: Never Used  . Alcohol Use: Yes     infrequently  . Drug Use: No  .  Sexually Active:    Other Topics Concern  . Not on file   Social History Narrative  . No narrative on file     BP 158/96  Pulse 80  Ht 5' 3.5" (1.613 m)  Wt 157 lb (71.215 kg)  BMI 27.38 kg/m2  Physical Exam:  Well appearing NAD HEENT: Unremarkable Neck:  No JVD, no thyromegally Lymphatics:  No adenopathy Back:  No CVA tenderness Lungs:  Clear HEART:  Regular rate rhythm, no murmurs, no rubs, no clicks Abd:  positive bowel sounds, no organomegally, no rebound, no guarding Ext:  2 plus pulses, no edema, no cyanosis, no clubbing Skin:  No rashes no nodules Neuro:  CN II through XII intact, motor grossly intact  DEVICE  Normal device function.  See PaceArt for details.   Assess/Plan:

## 2011-02-04 ENCOUNTER — Ambulatory Visit (INDEPENDENT_AMBULATORY_CARE_PROVIDER_SITE_OTHER): Payer: Medicare Other | Admitting: Family Medicine

## 2011-02-04 ENCOUNTER — Encounter: Payer: Self-pay | Admitting: Family Medicine

## 2011-02-04 VITALS — BP 148/80 | HR 80 | Temp 98.6°F | Ht 63.5 in | Wt 157.2 lb

## 2011-02-04 DIAGNOSIS — I10 Essential (primary) hypertension: Secondary | ICD-10-CM

## 2011-02-04 DIAGNOSIS — E7800 Pure hypercholesterolemia, unspecified: Secondary | ICD-10-CM

## 2011-02-04 DIAGNOSIS — E559 Vitamin D deficiency, unspecified: Secondary | ICD-10-CM

## 2011-02-04 DIAGNOSIS — J069 Acute upper respiratory infection, unspecified: Secondary | ICD-10-CM | POA: Insufficient documentation

## 2011-02-04 MED ORDER — AMLODIPINE BESYLATE 5 MG PO TABS: 5.0000 mg | ORAL_TABLET | Freq: Every day | ORAL | Status: DC

## 2011-02-04 NOTE — Progress Notes (Signed)
Subjective:    Patient ID: [name] [name], female    DOB: 11-Jan-1923, 77 y.o.   MRN: [id number]  HPI Here for f/u of chronic health problems incl lipids and HTN and vit D def  In addition yesterday started with scratchy throat and ears popping  Some cough  ? Fever - feels warm at times  Appetite is ok   Wt is stable   Last D level in 30s after high dose tx  Now on 5000 iu daily- and also out in the sun   HTN in fair control  134/86 today on diovan and hct  No cp or ha or edema   Last lipids imp with diet Pt declines med of any kind with this Lab Results  Component Value Date   CHOL 232* 07/25/2010   CHOL 260* 01/29/2010   CHOL 273* 07/19/2009   Lab Results  Component Value Date   HDL 58.90 07/25/2010   HDL 56.00 01/29/2010   HDL 61.80 07/19/2009   Lab Results  Component Value Date   LDLCALC 127* 09/18/2007   Lab Results  Component Value Date   TRIG 87.0 07/25/2010   TRIG 123.0 01/29/2010   TRIG 90.0 07/19/2009   Lab Results  Component Value Date   CHOLHDL 4 07/25/2010   CHOLHDL 5 01/29/2010   CHOLHDL 4 07/19/2009   Lab Results  Component Value Date   LDLDIRECT 146.6 07/25/2010   LDLDIRECT 175.2 01/29/2010   LDLDIRECT 193.4 07/19/2009   cardiol 3 year check up recently -- and her pacemaker was fine  Her bp was high that day She averages 130/90    She went home to check regularly 140/92 , and 121/74, -- and in between  No ha or cp or edema   Patient Active Problem List  Diagnoses  . UNSPECIFIED VITAMIN D DEFICIENCY  . HYPERCHOLESTEROLEMIA  . ANXIETY  . DEPRESSION  . HYPERTENSION  . PERICARDIAL EFFUSION  . PREMATURE VENTRICULAR CONTRACTIONS  . BRADYCARDIA  . HEMORRHOIDS  . VENOUS INSUFFICIENCY  . ALLERGIC RHINITIS  . GERD  . OSTEOARTHRITIS  . GANGLION CYST  . OSTEOPENIA  . INTERMITTENT VERTIGO  . MURMUR  . CARDIAC PACEMAKER IN SITU   Past Medical History  Diagnosis Date  . Allergic rhinitis   . Anxiety   . Depression   . GERD  (gastroesophageal reflux disease)   . Hypertension   . Osteopenia   . Bradycardia   . Hyperlipidemia   . Duodenal ulcer    Past Surgical History  Procedure Date  . Appendectomy   . Total abdominal hysterectomy   . Tonsillectomy   . Ankle fracture surgery 05/2002  . Insert / replace / remove pacemaker    History  Substance Use Topics  . Smoking status: Never Smoker   . Smokeless tobacco: Never Used  . Alcohol Use: Yes     infrequently   Family History  Problem Relation Age of Onset  . Heart attack Father   . Depression Mother   . Alzheimer's disease Mother   . Pneumonia Mother   . Heart disease Brother     hx of CABG   Allergies  Allergen Reactions  . Ace Inhibitors     REACTION: cough  . Alendronate Sodium     REACTION: aches  . Nitrofurantoin     REACTION: rash  . Penicillins     REACTION: rash   Current Outpatient Prescriptions on File Prior to Visit  Medication Sig Dispense Refill  . cholecalciferol (VITAMIN  D) 1000 UNITS tablet Take 5,000 Units by mouth daily.        . hydrochlorothiazide 25 MG tablet Take 25 mg by mouth daily.        . Multiple Vitamin (MULTIVITAMIN) tablet Take 1 tablet by mouth daily.        . sertraline (ZOLOFT) 100 MG tablet Take 100 mg by mouth daily.        . valsartan (DIOVAN) 160 MG tablet Take 160 mg by mouth daily.        Marland Kitchen ibuprofen (ADVIL,MOTRIN) 800 MG tablet Take 800 mg by mouth daily as needed. Always with food.      . meclizine (ANTIVERT) 25 MG tablet Take 1 tablet (25 mg total) by mouth 3 (three) times daily as needed for dizziness or nausea.  30 tablet  0      Review of Systems Review of Systems  Constitutional: Negative for fever, appetite change,  and unexpected weight change.pos for fatigue  Eyes: Negative for pain and visual disturbance.  Respiratory: Negative for shortness of breath.  pos for dry cough ENT pos for nasal congestion and sore throat Cardiovascular: Negative.for cp/ palpitations    Gastrointestinal:  Negative for nausea, diarrhea and constipation.  Genitourinary: Negative for urgency and frequency.  Skin: Negative for pallor.  Neurological: Negative for weakness, light-headedness, numbness and headaches.  Hematological: Negative for adenopathy. Does not bruise/bleed easily.  Psychiatric/Behavioral: Negative for dysphoric mood. The patient is not nervous/anxious.          Objective:   Physical Exam  Constitutional: She appears well-developed and well-nourished. No distress.  HENT:  Head: Normocephalic and atraumatic.  Right Ear: External ear normal.  Left Ear: External ear normal.  Mouth/Throat: Oropharynx is clear and moist.       Nares are boggy No sinus tenderness   Eyes: Conjunctivae and EOM are normal. Pupils are equal, round, and reactive to light.  Neck: Normal range of motion. Neck supple. No JVD present. Carotid bruit is not present. No thyromegaly present.  Cardiovascular: Normal rate, regular rhythm, normal heart sounds and intact distal pulses.   Pulmonary/Chest: Effort normal and breath sounds normal. No respiratory distress. She has no wheezes. She has no rales.  Abdominal: Soft. Bowel sounds are normal. She exhibits no distension and no abdominal bruit. There is no tenderness.  Musculoskeletal: She exhibits no edema and no tenderness.  Lymphadenopathy:    She has no cervical adenopathy.  Neurological: She is alert. She has normal reflexes. Coordination normal.  Skin: Skin is warm and dry. No rash noted. No erythema. No pallor.  Psychiatric: She has a normal mood and affect.          Assessment & Plan:

## 2011-02-04 NOTE — Patient Instructions (Signed)
Checking vitamin D level today Stay active- this is good for blood pressure  Drink lots of water  Start norvasc (amlodipine) 5 mg once daily- if any problems or side effects- please let me know  Try zantac 150 mg at night for indigestion or if you cough at night Zinc losenges can sometimes help at the beginning of a cold  Follow up with me in 6-8 weeks for blood pressure visit

## 2011-02-04 NOTE — Assessment & Plan Note (Signed)
bp is high at least half the time at home and here today and at cardiol office Convinced her to try 5 mg of amlodipine  Suspect the arb and hctz will prevent edema  Update if side eff F/u 6-8 wk

## 2011-02-04 NOTE — Assessment & Plan Note (Signed)
Last level in 30s Now on 5000 iu daily and with outdoor time Re check level today Disc imp of this to bone and general health

## 2011-02-04 NOTE — Assessment & Plan Note (Signed)
With post nasal drip and cough  Mild Disc sympt care Update if worse or fever /other symptoms

## 2011-02-04 NOTE — Assessment & Plan Note (Signed)
Pt continues to watch diet  Declines chol med Last LDL in 140s

## 2011-02-05 LAB — VITAMIN D 25 HYDROXY (VIT D DEFICIENCY, FRACTURES): Vit D, 25-Hydroxy: 26 ng/mL — ABNORMAL LOW (ref 30–89)

## 2011-03-18 ENCOUNTER — Encounter: Payer: Self-pay | Admitting: Family Medicine

## 2011-03-18 ENCOUNTER — Ambulatory Visit (INDEPENDENT_AMBULATORY_CARE_PROVIDER_SITE_OTHER): Payer: Medicare Other | Admitting: Family Medicine

## 2011-03-18 VITALS — BP 124/80 | HR 60 | Temp 98.0°F | Wt 158.0 lb

## 2011-03-18 DIAGNOSIS — I10 Essential (primary) hypertension: Secondary | ICD-10-CM

## 2011-03-18 NOTE — Assessment & Plan Note (Signed)
This is better despite intolerance to norvasc and stopping it  She had atypical rxn (nightmares) but is convinced it caused them Doing ok now Will continue to monitor She will purchase omron cuff for arm - to monitor at home and keep me updated  F/u 6 months

## 2011-03-18 NOTE — Patient Instructions (Signed)
The blood pressure cuff I recommend is OMRON for the arm size regular You can find this at most drugstores and [employer]/[facility]  Stay off the amlodipine  Blood pressure is good today  Follow up in about 6 months

## 2011-03-18 NOTE — Progress Notes (Signed)
Subjective:    Patient ID: [name] [name], female    DOB: Sep 14, 1922, 77 y.o.   MRN: [id number]  HPI At last visit disc labile blood pressures and fact that it is high out of the office   Woke up on Wednesday with the worst dream/ nightmare she had ever had and then got vertigo - which lasted 2 hours  Then noted her wt was low 150- unusual for her - then 156 after eating (thinks she lost fluid)  Not resting too well in general   Given px for amlodipine 5-- thinks it caused these side effects- stopped it  Feels good now and sleeping better    bp today is 124/80 No longer worried about it going up  Today is extra good  No ha or palpitations or cp  Getting good rest is helpful  ? Why she is resting better  No change in stress   Patient Active Problem List  Diagnoses  . UNSPECIFIED VITAMIN D DEFICIENCY  . HYPERCHOLESTEROLEMIA  . ANXIETY  . DEPRESSION  . HYPERTENSION  . PERICARDIAL EFFUSION  . PREMATURE VENTRICULAR CONTRACTIONS  . BRADYCARDIA  . HEMORRHOIDS  . VENOUS INSUFFICIENCY  . ALLERGIC RHINITIS  . GERD  . OSTEOARTHRITIS  . GANGLION CYST  . OSTEOPENIA  . INTERMITTENT VERTIGO  . MURMUR  . CARDIAC PACEMAKER IN SITU  . Viral URI   Past Medical History  Diagnosis Date  . Allergic rhinitis   . Anxiety   . Depression   . GERD (gastroesophageal reflux disease)   . Hypertension   . Osteopenia   . Bradycardia   . Hyperlipidemia   . Duodenal ulcer    Past Surgical History  Procedure Date  . Appendectomy   . Total abdominal hysterectomy   . Tonsillectomy   . Ankle fracture surgery 05/2002  . Insert / replace / remove pacemaker    History  Substance Use Topics  . Smoking status: Never Smoker   . Smokeless tobacco: Never Used  . Alcohol Use: Yes     infrequently   Family History  Problem Relation Age of Onset  . Heart attack Father   . Depression Mother   . Alzheimer's disease Mother   . Pneumonia Mother   . Heart disease Brother     hx of CABG    Allergies  Allergen Reactions  . Ace Inhibitors     REACTION: cough  . Alendronate Sodium     REACTION: aches  . Nitrofurantoin     REACTION: rash  . Penicillins     REACTION: rash   Current Outpatient Prescriptions on File Prior to Visit  Medication Sig Dispense Refill  . cetirizine (ZYRTEC) 10 MG tablet Take 10 mg by mouth daily.        . cholecalciferol (VITAMIN D) 1000 UNITS tablet Take 5,000 Units by mouth daily.        . hydrochlorothiazide 25 MG tablet Take 25 mg by mouth daily.        Marland Kitchen ibuprofen (ADVIL,MOTRIN) 800 MG tablet Take 800 mg by mouth daily as needed. Always with food.      . meclizine (ANTIVERT) 25 MG tablet Take 1 tablet (25 mg total) by mouth 3 (three) times daily as needed for dizziness or nausea.  30 tablet  0  . Multiple Vitamin (MULTIVITAMIN) tablet Take 1 tablet by mouth daily.        . sertraline (ZOLOFT) 100 MG tablet Take 100 mg by mouth daily.        Marland Kitchen  valsartan (DIOVAN) 160 MG tablet Take 160 mg by mouth daily.            Review of Systems Review of Systems  Constitutional: Negative for fever, appetite change, fatigue and unexpected weight change.  Eyes: Negative for pain and visual disturbance.  Respiratory: Negative for cough and shortness of breath.   Cardiovascular: Negative.for cp or palpitations   Gastrointestinal: Negative for nausea, diarrhea and constipation.  Genitourinary: Negative for urgency and frequency.  Skin: Negative for pallor. Or rash Neurological: Negative for weakness, light-headedness, numbness and headaches. (no longer having vertigo) Hematological: Negative for adenopathy. Does not bruise/bleed easily.  Psychiatric/Behavioral: Negative for dysphoric mood. The patient is not nervous/anxious.          Objective:   Physical Exam  Constitutional: She appears well-developed and well-nourished. No distress.       overwt and well appearing   HENT:  Head: Normocephalic and atraumatic.  Eyes: Conjunctivae and EOM are  normal. Pupils are equal, round, and reactive to light.  Neck: Normal range of motion. Neck supple. No JVD present. No thyromegaly present.  Cardiovascular: Normal rate, regular rhythm, normal heart sounds and intact distal pulses.   Pulmonary/Chest: Effort normal and breath sounds normal. No respiratory distress. She has no wheezes.  Abdominal: Soft. There is no tenderness.  Musculoskeletal: She exhibits no edema.  Lymphadenopathy:    She has no cervical adenopathy.  Neurological: She is alert. She has normal reflexes.       No tremor   Skin: Skin is warm and dry. No rash noted. No erythema. No pallor.  Psychiatric: She has a normal mood and affect.          Assessment & Plan:

## 2011-05-02 ENCOUNTER — Ambulatory Visit (INDEPENDENT_AMBULATORY_CARE_PROVIDER_SITE_OTHER): Payer: Medicare Other | Admitting: *Deleted

## 2011-05-02 ENCOUNTER — Other Ambulatory Visit: Payer: Self-pay | Admitting: Internal Medicine

## 2011-05-02 ENCOUNTER — Encounter: Payer: Self-pay | Admitting: Internal Medicine

## 2011-05-02 DIAGNOSIS — Z950 Presence of cardiac pacemaker: Secondary | ICD-10-CM

## 2011-05-02 DIAGNOSIS — I498 Other specified cardiac arrhythmias: Secondary | ICD-10-CM

## 2011-05-02 DIAGNOSIS — R001 Bradycardia, unspecified: Secondary | ICD-10-CM

## 2011-05-08 LAB — REMOTE PACEMAKER DEVICE
ATRIAL PACING PM: 96
BAMS-0001: 175 {beats}/min
RV LEAD IMPEDENCE PM: 618 Ohm
RV LEAD THRESHOLD: 0.875 V

## 2011-05-13 NOTE — Progress Notes (Signed)
Pacer remote check  

## 2011-05-21 ENCOUNTER — Telehealth: Payer: Self-pay | Admitting: Internal Medicine

## 2011-05-21 NOTE — Telephone Encounter (Signed)
Pt did remote check on 9-6 and has not heard anything.  Is she suppose to?  How does she know if it is ok?

## 2011-05-22 NOTE — Telephone Encounter (Signed)
Pt was notified that she will receive a letter in the mail.

## 2011-05-29 ENCOUNTER — Encounter: Payer: Self-pay | Admitting: *Deleted

## 2011-05-31 ENCOUNTER — Other Ambulatory Visit: Payer: Self-pay | Admitting: *Deleted

## 2011-05-31 ENCOUNTER — Encounter: Payer: Self-pay | Admitting: Internal Medicine

## 2011-05-31 ENCOUNTER — Ambulatory Visit (INDEPENDENT_AMBULATORY_CARE_PROVIDER_SITE_OTHER): Payer: Medicare Other | Admitting: *Deleted

## 2011-05-31 DIAGNOSIS — R0989 Other specified symptoms and signs involving the circulatory and respiratory systems: Secondary | ICD-10-CM

## 2011-05-31 DIAGNOSIS — I4891 Unspecified atrial fibrillation: Secondary | ICD-10-CM

## 2011-05-31 MED ORDER — METOPROLOL TARTRATE 25 MG PO TABS: 25.0000 mg | ORAL_TABLET | Freq: Two times a day (BID) | ORAL | Status: DC

## 2011-06-03 ENCOUNTER — Ambulatory Visit: Payer: Medicare Other | Admitting: Family Medicine

## 2011-06-06 ENCOUNTER — Ambulatory Visit (INDEPENDENT_AMBULATORY_CARE_PROVIDER_SITE_OTHER): Payer: Medicare Other | Admitting: Internal Medicine

## 2011-06-06 ENCOUNTER — Encounter: Payer: Self-pay | Admitting: Internal Medicine

## 2011-06-06 DIAGNOSIS — Z950 Presence of cardiac pacemaker: Secondary | ICD-10-CM

## 2011-06-06 DIAGNOSIS — I10 Essential (primary) hypertension: Secondary | ICD-10-CM

## 2011-06-06 DIAGNOSIS — I4891 Unspecified atrial fibrillation: Secondary | ICD-10-CM | POA: Insufficient documentation

## 2011-06-06 DIAGNOSIS — I498 Other specified cardiac arrhythmias: Secondary | ICD-10-CM

## 2011-06-06 LAB — PACEMAKER DEVICE OBSERVATION
AL THRESHOLD: 0.75 V
RV LEAD AMPLITUDE: 11.2 mv
VENTRICULAR PACING PM: 2.6

## 2011-06-06 LAB — BASIC METABOLIC PANEL
CO2: 30 mEq/L (ref 19–32)
Calcium: 9.4 mg/dL (ref 8.4–10.5)
Creatinine, Ser: 1 mg/dL (ref 0.4–1.2)
Glucose, Bld: 94 mg/dL (ref 70–99)

## 2011-06-06 LAB — REMOTE PACEMAKER DEVICE
AL IMPEDENCE PM: 404 Ohm
ATRIAL PACING PM: 79
RV LEAD IMPEDENCE PM: 572 Ohm
VENTRICULAR PACING PM: 3

## 2011-06-06 MED ORDER — DABIGATRAN ETEXILATE MESYLATE 75 MG PO CAPS: 75.0000 mg | ORAL_CAPSULE | Freq: Two times a day (BID) | ORAL | Status: DC

## 2011-06-06 NOTE — Assessment & Plan Note (Signed)
Her device is working normally. We'll plan a recheck in several months. 

## 2011-06-06 NOTE — Assessment & Plan Note (Signed)
The patient has paroxysms of atrial fibrillation for which she is asymptomatic but clearly documented on her pacemaker histograms. I recommend that we start anticoagulation. We'll plan to begin Pradaxa 75 mg daily as her advanced age will result in a reduced creatinine clearance.

## 2011-06-06 NOTE — Progress Notes (Signed)
HPI [name] [name] returns today for followup.  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 She denies palpitations. Since we last saw the patient, she has felt well. She denies chest pain, syncope, shortness of breath, or palpitation. Her remote monitoring on her pacemaker demonstrated paroxysmal atrial fibrillation for which she was asymptomatic Allergies  Allergen Reactions  . Ace Inhibitors     REACTION: cough  . Alendronate Sodium     REACTION: aches  . Nitrofurantoin     REACTION: rash  . Penicillins     REACTION: rash     Current Outpatient Prescriptions  Medication Sig Dispense Refill  . cetirizine (ZYRTEC) 10 MG tablet Take 10 mg by mouth daily.        . cholecalciferol (VITAMIN D) 1000 UNITS tablet Take 5,000 Units by mouth daily.        . hydrochlorothiazide 25 MG tablet Take 25 mg by mouth daily.        Marland Kitchen ibuprofen (ADVIL,MOTRIN) 800 MG tablet Take 800 mg by mouth daily as needed. Always with food.      . meclizine (ANTIVERT) 25 MG tablet Take 1 tablet (25 mg total) by mouth 3 (three) times daily as needed for dizziness or nausea.  30 tablet  0  . metoprolol tartrate (LOPRESSOR) 25 MG tablet Take 1 tablet (25 mg total) by mouth 2 (two) times daily.  60 tablet  11  . Multiple Vitamin (MULTIVITAMIN) tablet Take 1 tablet by mouth daily.        . sertraline (ZOLOFT) 100 MG tablet Take 100 mg by mouth daily.        . valsartan (DIOVAN) 160 MG tablet Take 160 mg by mouth daily.        . dabigatran (PRADAXA) 75 MG CAPS Take 1 capsule (75 mg total) by mouth every 12 (twelve) hours.  60 capsule  6     Past Medical History  Diagnosis Date  . Allergic rhinitis   . Anxiety   . Depression   . GERD (gastroesophageal reflux disease)   . Hypertension   . Osteopenia   . Bradycardia   . Hyperlipidemia   . Duodenal ulcer     ROS:   All systems reviewed and negative except as noted in the  HPI.   Past Surgical History  Procedure Date  . Appendectomy   . Total abdominal hysterectomy   . Tonsillectomy   . Ankle fracture surgery 05/2002  . Insert / replace / remove pacemaker      Family History  Problem Relation Age of Onset  . Heart attack Father   . Depression Mother   . Alzheimer's disease Mother   . Pneumonia Mother   . Heart disease Brother     hx of CABG     History   Social History  . Marital Status: Married    Spouse Name: N/A    Number of Children: N/A  . Years of Education: N/A   Occupational History  . Not on file.   Social History Main Topics  . Smoking status: Never Smoker   . Smokeless tobacco: Never Used  . Alcohol Use: Yes     infrequently  . Drug Use: No  . Sexually Active:    Other Topics Concern  . Not on file   Social History Narrative  . No narrative on file     BP 156/90  Pulse 80  Ht 5\' 4"  (1.626  m)  Wt 158 lb (71.668 kg)  BMI 27.12 kg/m2  Physical Exam:  Well appearing elderly woman, NAD HEENT: Unremarkable Neck:  No JVD, no thyromegally Lymphatics:  No adenopathy Back:  No CVA tenderness Lungs:  Clear with no wheezes, rales, or rhonchi. Well-healed pacemaker incision. HEART:  Regular rate rhythm, no murmurs, no rubs, no clicks Abd:  soft, positive bowel sounds, no organomegally, no rebound, no guarding Ext:  2 plus pulses, no edema, no cyanosis, no clubbing Skin:  No rashes no nodules Neuro:  CN II through XII intact, motor grossly intact  DEVICE  Normal device function.  See PaceArt for details. Paroxysmal A. Fib noted on histogram is. Several episodes greater than 4 days.  Assess/Plan:

## 2011-06-06 NOTE — Patient Instructions (Signed)
Your physician recommends that you schedule a follow-up appointment in: 3 months with Dr [name]  Your physician recommends that you return for lab work today:BMP  Your physician has recommended you make the following change in your medication:  1)start Pradaxa 75mg  twice daily

## 2011-06-06 NOTE — Progress Notes (Signed)
Remote check in clinic

## 2011-06-11 ENCOUNTER — Other Ambulatory Visit: Payer: Self-pay | Admitting: Internal Medicine

## 2011-06-11 DIAGNOSIS — I4891 Unspecified atrial fibrillation: Secondary | ICD-10-CM

## 2011-06-13 ENCOUNTER — Other Ambulatory Visit: Payer: Self-pay

## 2011-06-13 MED ORDER — DABIGATRAN ETEXILATE MESYLATE 75 MG PO CAPS: 75.0000 mg | ORAL_CAPSULE | Freq: Two times a day (BID) | ORAL | Status: DC

## 2011-06-13 NOTE — Telephone Encounter (Signed)
Pt calling to check on status of pt refill request of pradaxa 75 mg. Please call this refill request into [facility] in [place].

## 2011-07-02 ENCOUNTER — Telehealth: Payer: Self-pay | Admitting: Internal Medicine

## 2011-07-02 NOTE — Telephone Encounter (Signed)
Returned call to patient and left her a message to return my call regarding a side effect she may be having

## 2011-07-02 NOTE — Telephone Encounter (Signed)
Pt returning your call

## 2011-07-02 NOTE — Telephone Encounter (Signed)
Pt needs to talk to nurse re side effects from meds

## 2011-07-03 NOTE — Telephone Encounter (Signed)
Pt returning call to Kelly. Please call back.  

## 2011-07-03 NOTE — Telephone Encounter (Signed)
Returned call to patient and left another message.

## 2011-07-11 NOTE — Telephone Encounter (Signed)
Returned call to patient and she says she was having terrible side effects from Pradaxa  Indigestion, loose stools which led to hemmorroids She stopped the medication and she feels much better

## 2011-07-12 NOTE — Telephone Encounter (Signed)
Discussed with Dr [name] and he says that not taking anything she is not protected from having a stroke  I did explain that he stated he would like for her to try and restart the Pradaxa 75mg  twice daily and give it a few weeks to see if the side effects subside  Left message on machine for patient

## 2011-08-08 ENCOUNTER — Encounter: Payer: Medicare Other | Admitting: *Deleted

## 2011-08-11 ENCOUNTER — Telehealth: Payer: Self-pay | Admitting: Family Medicine

## 2011-08-11 DIAGNOSIS — E7800 Pure hypercholesterolemia, unspecified: Secondary | ICD-10-CM

## 2011-08-11 DIAGNOSIS — M899 Disorder of bone, unspecified: Secondary | ICD-10-CM

## 2011-08-11 DIAGNOSIS — I10 Essential (primary) hypertension: Secondary | ICD-10-CM

## 2011-08-11 DIAGNOSIS — K219 Gastro-esophageal reflux disease without esophagitis: Secondary | ICD-10-CM

## 2011-08-11 DIAGNOSIS — E559 Vitamin D deficiency, unspecified: Secondary | ICD-10-CM

## 2011-08-11 NOTE — Telephone Encounter (Signed)
Message copied by Judy Pimple on Sun Aug 11, 2011  4:38 PM ------      Message from: Baldomero Lamy      Created: Mon Aug 05, 2011  8:29 AM      Regarding: F/u labs Mon 12/17       Please order  future f/u labs for pt's upcomming lab appt.      Thanks      Rodney Booze

## 2011-08-12 ENCOUNTER — Other Ambulatory Visit (INDEPENDENT_AMBULATORY_CARE_PROVIDER_SITE_OTHER): Payer: Medicare Other

## 2011-08-12 DIAGNOSIS — E7800 Pure hypercholesterolemia, unspecified: Secondary | ICD-10-CM

## 2011-08-12 DIAGNOSIS — I10 Essential (primary) hypertension: Secondary | ICD-10-CM

## 2011-08-12 DIAGNOSIS — M949 Disorder of cartilage, unspecified: Secondary | ICD-10-CM

## 2011-08-12 DIAGNOSIS — K219 Gastro-esophageal reflux disease without esophagitis: Secondary | ICD-10-CM

## 2011-08-12 DIAGNOSIS — E559 Vitamin D deficiency, unspecified: Secondary | ICD-10-CM

## 2011-08-12 LAB — COMPREHENSIVE METABOLIC PANEL
AST: 18 U/L (ref 0–37)
BUN: 21 mg/dL (ref 6–23)
Calcium: 9.8 mg/dL (ref 8.4–10.5)
Chloride: 101 mEq/L (ref 96–112)
Creatinine, Ser: 1 mg/dL (ref 0.4–1.2)
Total Bilirubin: 0.7 mg/dL (ref 0.3–1.2)

## 2011-08-12 LAB — CBC WITH DIFFERENTIAL/PLATELET
Basophils Relative: 0.8 % (ref 0.0–3.0)
Eosinophils Absolute: 0.3 10*3/uL (ref 0.0–0.7)
HCT: 35.3 % — ABNORMAL LOW (ref 36.0–46.0)
Hemoglobin: 12.1 g/dL (ref 12.0–15.0)
Lymphs Abs: 1.2 10*3/uL (ref 0.7–4.0)
MCHC: 34.3 g/dL (ref 30.0–36.0)
MCV: 92.1 fl (ref 78.0–100.0)
Monocytes Absolute: 0.5 10*3/uL (ref 0.1–1.0)
Neutro Abs: 2.4 10*3/uL (ref 1.4–7.7)
RBC: 3.83 Mil/uL — ABNORMAL LOW (ref 3.87–5.11)
RDW: 13.3 % (ref 11.5–14.6)

## 2011-08-12 LAB — LIPID PANEL
HDL: 60.4 mg/dL (ref >39.00)
Triglycerides: 71 mg/dL (ref 0.0–149.0)
VLDL: 14.2 mg/dL (ref 0.0–40.0)

## 2011-08-12 LAB — TSH: TSH: 2.81 u[IU]/mL (ref 0.35–5.50)

## 2011-08-16 ENCOUNTER — Ambulatory Visit (INDEPENDENT_AMBULATORY_CARE_PROVIDER_SITE_OTHER): Payer: Medicare Other | Admitting: Family Medicine

## 2011-08-16 ENCOUNTER — Encounter: Payer: Self-pay | Admitting: Family Medicine

## 2011-08-16 VITALS — BP 146/88 | HR 80 | Temp 98.6°F | Ht 64.0 in | Wt 157.8 lb

## 2011-08-16 DIAGNOSIS — I10 Essential (primary) hypertension: Secondary | ICD-10-CM

## 2011-08-16 DIAGNOSIS — R059 Cough, unspecified: Secondary | ICD-10-CM

## 2011-08-16 DIAGNOSIS — R05 Cough: Secondary | ICD-10-CM | POA: Insufficient documentation

## 2011-08-16 DIAGNOSIS — E7800 Pure hypercholesterolemia, unspecified: Secondary | ICD-10-CM

## 2011-08-16 MED ORDER — AMLODIPINE BESYLATE 5 MG PO TABS: 5.0000 mg | ORAL_TABLET | Freq: Every day | ORAL | Status: DC

## 2011-08-16 MED ORDER — SERTRALINE HCL 100 MG PO TABS: 100.0000 mg | ORAL_TABLET | Freq: Every day | ORAL | Status: DC

## 2011-08-16 MED ORDER — VALSARTAN 160 MG PO TABS: 160.0000 mg | ORAL_TABLET | Freq: Every day | ORAL | Status: DC

## 2011-08-16 MED ORDER — HYDROCHLOROTHIAZIDE 25 MG PO TABS: 25.0000 mg | ORAL_TABLET | Freq: Every day | ORAL | Status: DC

## 2011-08-16 NOTE — Assessment & Plan Note (Signed)
This is stable and pt declines med of any kind Disc goals for lipids and reasons to control them Rev labs with pt Rev low sat fat diet in detail

## 2011-08-16 NOTE — Patient Instructions (Addendum)
bp is ok - and I am glad it is even better at home Stay active and try to eat a healthy diet  Avoid red meat/ fried foods/ egg yolks/ fatty breakfast meats/ butter, cheese and high fat dairy/ and shellfish   Overall cholesterol is pretty stable  If you change your mind about a flu shot - it is a good idea  Drink fluids and get some rest this weekend  Follow up in 6 mo for annual exam with labs prior

## 2011-08-16 NOTE — Progress Notes (Signed)
Subjective:    Patient ID: [name] [name], female    DOB: March 27, 1923, 77 y.o.   MRN: [id number]  HPI Here for f/u of HTN and hyperlipidemia  Just started a little cough today  No other symptoms- no fever/ nasal symptoms   Now L knee is puffed up and a little sore  No trauma Other than that doing well   Refuses flu shot - will not negotiate   bp is  146/88   Today Borderline  Checking at home -- is better at home -- 120s/80s for the most part  On diovan Did not tolerate norvasc No cp or palpitations or headaches or edema  No side effects to medicines    Lipids are stable Declines med Lab Results  Component Value Date   CHOL 224* 08/12/2011   HDL 60.40 08/12/2011   LDLCALC 127* 09/18/2007   LDLDIRECT 141.5 08/12/2011   TRIG 71.0 08/12/2011   CHOLHDL 4 08/12/2011   is watching diet at times - not all the time - not really concerned about it  No fried foods at all  Sweets are harder to avoid   Hx of afib  Had episode in October - pulse got way high  Is doing better now - thinks she paniced a little  Ended up talking to cardiology - took lopressor for short while and did a dx over the phone and f/u Tried pradaxa -- and did not tolerate (she is irritated about it ) - lots of GI side effects   Patient Active Problem List  Diagnoses  . UNSPECIFIED VITAMIN D DEFICIENCY  . HYPERCHOLESTEROLEMIA  . ANXIETY  . DEPRESSION  . HYPERTENSION  . PERICARDIAL EFFUSION  . PREMATURE VENTRICULAR CONTRACTIONS  . BRADYCARDIA  . HEMORRHOIDS  . VENOUS INSUFFICIENCY  . ALLERGIC RHINITIS  . GERD  . OSTEOARTHRITIS  . GANGLION CYST  . OSTEOPENIA  . INTERMITTENT VERTIGO  . MURMUR  . CARDIAC PACEMAKER IN SITU  . Atrial fibrillation  . Cough   Past Medical History  Diagnosis Date  . Allergic rhinitis   . Anxiety   . Depression   . GERD (gastroesophageal reflux disease)   . Hypertension   . Osteopenia   . Bradycardia   . Hyperlipidemia   . Duodenal ulcer    Past  Surgical History  Procedure Date  . Appendectomy   . Total abdominal hysterectomy   . Tonsillectomy   . Ankle fracture surgery 05/2002  . Insert / replace / remove pacemaker    History  Substance Use Topics  . Smoking status: Never Smoker   . Smokeless tobacco: Never Used  . Alcohol Use: Yes     infrequently   Family History  Problem Relation Age of Onset  . Heart attack Father   . Depression Mother   . Alzheimer's disease Mother   . Pneumonia Mother   . Heart disease Brother     hx of CABG   Allergies  Allergen Reactions  . Ace Inhibitors     REACTION: cough  . Alendronate Sodium     REACTION: aches  . Nitrofurantoin     REACTION: rash  . Penicillins     REACTION: rash  . Pradaxa Diarrhea    indigestion   Current Outpatient Prescriptions on File Prior to Visit  Medication Sig Dispense Refill  . cholecalciferol (VITAMIN D) 1000 UNITS tablet Take 5,000 Units by mouth daily.        . metoprolol tartrate (LOPRESSOR) 25 MG tablet Take 1  tablet (25 mg total) by mouth 2 (two) times daily.  60 tablet  11  . Multiple Vitamin (MULTIVITAMIN) tablet Take 1 tablet by mouth daily.        . cetirizine (ZYRTEC) 10 MG tablet Take 10 mg by mouth daily.        . meclizine (ANTIVERT) 25 MG tablet Take 1 tablet (25 mg total) by mouth 3 (three) times daily as needed for dizziness or nausea.  30 tablet  0         Review of Systems Review of Systems  Constitutional: Negative for fever, appetite change, fatigue and unexpected weight change.  Eyes: Negative for pain and visual disturbance.  Respiratory: Negative for  shortness of breath., prod or wheeze    Cardiovascular: Negative for cp or palpitations   (no palpitations now) Gastrointestinal: Negative for nausea, diarrhea and constipation.  Genitourinary: Negative for urgency and frequency.  Skin: Negative for pallor or rash   Neurological: Negative for weakness, light-headedness, numbness and headaches.  Hematological: Negative  for adenopathy. Does not bruise/bleed easily.  Psychiatric/Behavioral: Negative for dysphoric mood. The patient is not nervous/anxious.          Objective:   Physical Exam  Constitutional: She appears well-developed and well-nourished. No distress.  HENT:  Head: Normocephalic and atraumatic.  Right Ear: External ear normal.  Left Ear: External ear normal.  Mouth/Throat: Oropharynx is clear and moist. No oropharyngeal exudate.  Eyes: Conjunctivae and EOM are normal. Pupils are equal, round, and reactive to light. Right eye exhibits no discharge. Left eye exhibits no discharge.  Neck: Normal range of motion. Neck supple. No JVD present. Carotid bruit is not present. No thyromegaly present.  Cardiovascular: Normal rate, regular rhythm, normal heart sounds and intact distal pulses.  Exam reveals no gallop.   Pulmonary/Chest: Effort normal and breath sounds normal. No respiratory distress. She has no wheezes. She exhibits no tenderness.  Abdominal: Soft. She exhibits no distension, no abdominal bruit and no mass. There is no tenderness.  Musculoskeletal: She exhibits tenderness. She exhibits no edema.       Some medial tenderness L knee with OA  Lymphadenopathy:    She has no cervical adenopathy.  Neurological: She is alert. She has normal reflexes. No cranial nerve deficit. She exhibits normal muscle tone. Coordination normal.  Skin: Skin is warm and dry. No rash noted. No erythema. No pallor.  Psychiatric: She has a normal mood and affect.          Assessment & Plan:

## 2011-08-16 NOTE — Assessment & Plan Note (Signed)
bp in fair control at this time  No changes needed  Disc lifstyle change with low sodium diet and exercise   Now on lopressor also for HR Is back on low dose norvasc  bp are a bit better at home compared to here Disc healthy lifestyle Will continue to follow

## 2011-08-16 NOTE — Assessment & Plan Note (Signed)
Very mild just starting today Could be start of uri - too hard to tell Disc sympt care  Unfortunately pt refuses flu shots  Will update if worse Nl exam

## 2011-08-26 ENCOUNTER — Telehealth: Payer: Self-pay | Admitting: Internal Medicine

## 2011-08-26 NOTE — Telephone Encounter (Signed)
Some of those medicines -levaquin and prednisone can certainly make people have cognitive problems/ confusion  If this persists after stopping meds or is severe however please follow up  Also it may be a good idea to make sure a family member is staying with her or checking in on her

## 2011-08-26 NOTE — Telephone Encounter (Signed)
Patient stated she went to urgent care ([facility]) and they Dx her with bronchitis and they Rx her a Prednisone, Levofloxacin and inhaler and since she has taken this she can't remember anything.  She states she can tell a difference with her memory.  Please sign.

## 2011-08-26 NOTE — Telephone Encounter (Signed)
Patient notified as instructed by telephone. Was advised by patient that her family is checking on her.

## 2011-09-02 ENCOUNTER — Encounter: Payer: Self-pay | Admitting: Family Medicine

## 2011-09-02 ENCOUNTER — Ambulatory Visit (INDEPENDENT_AMBULATORY_CARE_PROVIDER_SITE_OTHER): Payer: Medicare Other | Admitting: Family Medicine

## 2011-09-02 VITALS — BP 148/88 | HR 84 | Temp 98.3°F | Ht 64.0 in | Wt 154.8 lb

## 2011-09-02 DIAGNOSIS — J40 Bronchitis, not specified as acute or chronic: Secondary | ICD-10-CM | POA: Insufficient documentation

## 2011-09-02 NOTE — Patient Instructions (Signed)
I'm glad you are starting to feel better  Get rest and drink lots of fluids and use inhaler when you need it  Update if not starting to improve in a week or if worsening   Get a flu shot when feeling better - call us or go to a pharmacy

## 2011-09-02 NOTE — Progress Notes (Signed)
Subjective:    Patient ID: [name] [name], female    DOB: 07/02/1923, 78 y.o.   MRN: [id number]  HPI Here for f/u of bronchitis Was seen on 12/25 at [facility] in [place] Started around the 21st - started with cold symptoms   At [facility] -- had started wheezing  Gave her abx and steroid -- levaquin and prednisone They made her nervous and confused Finally feeling some better today- out of bed for first time No longer wheezing  Not coughing up much- white   Has inhaler- not needing much  Wants to wait on flu shot until a little better   Patient Active Problem List  Diagnoses  . UNSPECIFIED VITAMIN D DEFICIENCY  . HYPERCHOLESTEROLEMIA  . ANXIETY  . DEPRESSION  . HYPERTENSION  . PERICARDIAL EFFUSION  . PREMATURE VENTRICULAR CONTRACTIONS  . BRADYCARDIA  . HEMORRHOIDS  . VENOUS INSUFFICIENCY  . ALLERGIC RHINITIS  . GERD  . OSTEOARTHRITIS  . GANGLION CYST  . OSTEOPENIA  . INTERMITTENT VERTIGO  . MURMUR  . CARDIAC PACEMAKER IN SITU  . Atrial fibrillation  . Cough  . Bronchitis   Past Medical History  Diagnosis Date  . Allergic rhinitis   . Anxiety   . Depression   . GERD (gastroesophageal reflux disease)   . Hypertension   . Osteopenia   . Bradycardia   . Hyperlipidemia   . Duodenal ulcer    Past Surgical History  Procedure Date  . Appendectomy   . Total abdominal hysterectomy   . Tonsillectomy   . Ankle fracture surgery 05/2002  . Insert / replace / remove pacemaker    History  Substance Use Topics  . Smoking status: Never Smoker   . Smokeless tobacco: Never Used  . Alcohol Use: Yes     infrequently   Family History  Problem Relation Age of Onset  . Heart attack Father   . Depression Mother   . Alzheimer's disease Mother   . Pneumonia Mother   . Heart disease Brother     hx of CABG   Allergies  Allergen Reactions  . Ace Inhibitors     REACTION: cough  . Alendronate Sodium     REACTION: aches  . Nitrofurantoin     REACTION: rash  .  Penicillins     REACTION: rash  . Pradaxa Diarrhea    indigestion  . Prednisone Other (See Comments)    Extreme weakness   Current Outpatient Prescriptions on File Prior to Visit  Medication Sig Dispense Refill  . amLODipine (NORVASC) 5 MG tablet Take 1 tablet (5 mg total) by mouth daily.  90 tablet  3  . cetirizine (ZYRTEC) 10 MG tablet Take 10 mg by mouth daily.        . cholecalciferol (VITAMIN D) 1000 UNITS tablet Take 5,000 Units by mouth daily.        . hydrochlorothiazide (HYDRODIURIL) 25 MG tablet Take 1 tablet (25 mg total) by mouth daily.  90 tablet  3  . metoprolol tartrate (LOPRESSOR) 25 MG tablet Take 1 tablet (25 mg total) by mouth 2 (two) times daily.  60 tablet  11  . Multiple Vitamin (MULTIVITAMIN) tablet Take 1 tablet by mouth daily.        . sertraline (ZOLOFT) 100 MG tablet Take 1 tablet (100 mg total) by mouth daily.  90 tablet  3  . valsartan (DIOVAN) 160 MG tablet Take 1 tablet (160 mg total) by mouth daily.  90 tablet  3  . meclizine (  ANTIVERT) 25 MG tablet Take 1 tablet (25 mg total) by mouth 3 (three) times daily as needed for dizziness or nausea.  30 tablet  0       Review of Systems Review of Systems  Constitutional: Negative for fever, appetite change, fatigue and unexpected weight change.  Eyes: Negative for pain and visual disturbance.  ENT pos for runny nose and post nasal drip, neg for st or ear pain Respiratory: Negative for sob, pos for cough and wheeze   Cardiovascular: Negative for cp or palpitations    Gastrointestinal: Negative for nausea, diarrhea and constipation.  Genitourinary: Negative for urgency and frequency.  Skin: Negative for pallor or rash   Neurological: Negative for weakness, light-headedness, numbness and headaches.  Hematological: Negative for adenopathy. Does not bruise/bleed easily.  Psychiatric/Behavioral: Negative for dysphoric mood. The patient is not nervous/anxious.          Objective:   Physical Exam    Constitutional: She appears well-developed and well-nourished. No distress.  HENT:  Head: Normocephalic and atraumatic.  Right Ear: External ear normal.  Left Ear: External ear normal.  Mouth/Throat: Oropharynx is clear and moist.       Nares are injected and congested  No sinus tenderness  Eyes: Conjunctivae and EOM are normal. Pupils are equal, round, and reactive to light. Right eye exhibits no discharge. Left eye exhibits no discharge.  Neck: Normal range of motion. Neck supple. No JVD present. No thyromegaly present.  Cardiovascular: Normal rate, regular rhythm and normal heart sounds.   Pulmonary/Chest: Effort normal. No respiratory distress. She has wheezes. She has no rales. She exhibits no tenderness.       Harsh bs at bases No rales or crackles Wheeze on forced exp No prolonged exp phase  Musculoskeletal: She exhibits no edema.  Lymphadenopathy:    She has no cervical adenopathy.  Neurological: She is alert. She has normal reflexes. No cranial nerve deficit.  Skin: Skin is warm and dry. No rash noted. No erythema. No pallor.  Psychiatric: She has a normal mood and affect.          Assessment & Plan:

## 2011-09-02 NOTE — Assessment & Plan Note (Signed)
Acute bronchitis with reactive airways - now much improved after levaquin and prednisone (note did get confused on these meds) Very reassuring exam today Disc symptomatic care - see instructions on AVS  Has albuterol hfa inhaler prn Will update if worse or not imp in another week -- then when better will get flu shot

## 2011-09-05 ENCOUNTER — Encounter: Payer: Medicare Other | Admitting: *Deleted

## 2011-09-09 ENCOUNTER — Encounter: Payer: Self-pay | Admitting: *Deleted

## 2011-09-11 ENCOUNTER — Ambulatory Visit (INDEPENDENT_AMBULATORY_CARE_PROVIDER_SITE_OTHER): Payer: Medicare Other | Admitting: Family Medicine

## 2011-09-11 ENCOUNTER — Ambulatory Visit (INDEPENDENT_AMBULATORY_CARE_PROVIDER_SITE_OTHER)
Admission: RE | Admit: 2011-09-11 | Discharge: 2011-09-11 | Disposition: A | Discharge status: Home / Self Care | Payer: Medicare Other | Source: Ambulatory Visit | Attending: Family Medicine | Admitting: Family Medicine

## 2011-09-11 ENCOUNTER — Encounter: Payer: Self-pay | Admitting: Family Medicine

## 2011-09-11 VITALS — BP 144/86 | HR 80 | Temp 99.0°F | Ht 64.0 in | Wt 154.8 lb

## 2011-09-11 DIAGNOSIS — N390 Urinary tract infection, site not specified: Secondary | ICD-10-CM | POA: Insufficient documentation

## 2011-09-11 DIAGNOSIS — J40 Bronchitis, not specified as acute or chronic: Secondary | ICD-10-CM

## 2011-09-11 DIAGNOSIS — R319 Hematuria, unspecified: Secondary | ICD-10-CM | POA: Insufficient documentation

## 2011-09-11 LAB — POCT URINALYSIS DIPSTICK
Ketones, UA: NEGATIVE
Protein, UA: NEGATIVE
Spec Grav, UA: 1.01

## 2011-09-11 MED ORDER — SULFAMETHOXAZOLE-TRIMETHOPRIM 800-160 MG PO TABS: 1.0000 | ORAL_TABLET | Freq: Two times a day (BID) | ORAL | Status: AC

## 2011-09-11 NOTE — Assessment & Plan Note (Signed)
ua with mod blood Suspect uti cx sent Cover with bactrim

## 2011-09-11 NOTE — Assessment & Plan Note (Signed)
Ongoing cough that is productive but gradually improving now  Low grade fever was worrisome-so took cxr (prelim cardiomeg with pacer- no infiltrate-- but waiting for radiol overread) Covering with bactrim for uti  Expectorant prn Reassuring exam Update if not starting to improve in a week or if worsening

## 2011-09-11 NOTE — Assessment & Plan Note (Signed)
With episode of gross hematuria at home- now microscopic Pend cx Cover with bactrim and update She will call if symptoms return  Consider vaginal exam at that time if this continues

## 2011-09-11 NOTE — Patient Instructions (Addendum)
Take the bactrim dS antibiotic for urine infection and also bronchitis  You may still cough for a while I will update you with final chest xray reading and also urine culture when they return Gets lots of rest and drink lots of water  If you get more blood please let me know

## 2011-09-11 NOTE — Progress Notes (Signed)
Subjective:    Patient ID: [name] [name], female    DOB: [birth year], 78 y.o.   MRN: [id number]  HPI Here with uri symptoms   Was seen for bronchitis f/u 1/7-- after levaquin and prednisone (made her confused)  Now is sneezing like crazy and also congestion Drinking lots of fluids Chills on and off Fever of about 99--- cold and hot for 4 weeks  Has not had a chest x ray   Had a little blood in panties-thinks from urine No pelvic or bladder pain  No pain to urinate  Has not given urine yet  Urinating frequently   Has been on naproxen   Patient Active Problem List  Diagnoses  . UNSPECIFIED VITAMIN D DEFICIENCY  . HYPERCHOLESTEROLEMIA  . ANXIETY  . DEPRESSION  . HYPERTENSION  . PERICARDIAL EFFUSION  . PREMATURE VENTRICULAR CONTRACTIONS  . BRADYCARDIA  . HEMORRHOIDS  . VENOUS INSUFFICIENCY  . ALLERGIC RHINITIS  . GERD  . OSTEOARTHRITIS  . GANGLION CYST  . OSTEOPENIA  . INTERMITTENT VERTIGO  . MURMUR  . CARDIAC PACEMAKER IN SITU  . Atrial fibrillation  . Cough  . Bronchitis  . Hematuria  . UTI (lower urinary tract infection)   Past Medical History  Diagnosis Date  . Allergic rhinitis   . Anxiety   . Depression   . GERD (gastroesophageal reflux disease)   . Hypertension   . Osteopenia   . Bradycardia   . Hyperlipidemia   . Duodenal ulcer    Past Surgical History  Procedure Date  . Appendectomy   . Total abdominal hysterectomy   . Tonsillectomy   . Ankle fracture surgery 05/2002  . Insert / replace / remove pacemaker    History  Substance Use Topics  . Smoking status: Never Smoker   . Smokeless tobacco: Never Used  . Alcohol Use: Yes     infrequently   Family History  Problem Relation Age of Onset  . Heart attack Father   . Depression Mother   . Alzheimer's disease Mother   . Pneumonia Mother   . Heart disease Brother     hx of CABG   Allergies  Allergen Reactions  . Ace Inhibitors     REACTION: cough  . Alendronate Sodium    REACTION: aches  . Nitrofurantoin     REACTION: rash  . Penicillins     REACTION: rash  . Pradaxa Diarrhea    indigestion  . Prednisone Other (See Comments)    Extreme weakness   Current Outpatient Prescriptions on File Prior to Visit  Medication Sig Dispense Refill  . amLODipine (NORVASC) 5 MG tablet Take 1 tablet (5 mg total) by mouth daily.  90 tablet  3  . cetirizine (ZYRTEC) 10 MG tablet Take 10 mg by mouth daily.        . cholecalciferol (VITAMIN D) 1000 UNITS tablet Take 5,000 Units by mouth daily.        . hydrochlorothiazide (HYDRODIURIL) 25 MG tablet Take 1 tablet (25 mg total) by mouth daily.  90 tablet  3  . metoprolol tartrate (LOPRESSOR) 25 MG tablet Take 1 tablet (25 mg total) by mouth 2 (two) times daily.  60 tablet  11  . Multiple Vitamin (MULTIVITAMIN) tablet Take 1 tablet by mouth daily.        . sertraline (ZOLOFT) 100 MG tablet Take 1 tablet (100 mg total) by mouth daily.  90 tablet  3  . valsartan (DIOVAN) 160 MG tablet Take 1 tablet (160  mg total) by mouth daily.  90 tablet  3  . meclizine (ANTIVERT) 25 MG tablet Take 1 tablet (25 mg total) by mouth 3 (three) times daily as needed for dizziness or nausea.  30 tablet  0         Review of Systems Review of Systems  Constitutional: Negative for fever, appetite change, fatigue and unexpected weight change.  Eyes: Negative for pain and visual disturbance. ENT neg for severe sore throat or sinus pain  Respiratory: Negative for sob or chest pain   Cardiovascular: Negative for cp or palpitations    Gastrointestinal: Negative for nausea, diarrhea and constipation.  Genitourinary:pos for frequency and hematuria/ neg for urgency , back pain, pos for fever  Skin: Negative for pallor or rash   Neurological: Negative for weakness, light-headedness, numbness and headaches.  Hematological: Negative for adenopathy. Does not bruise/bleed easily.  Psychiatric/Behavioral: Negative for dysphoric mood. The patient is not  nervous/anxious.          Objective:   Physical Exam  Constitutional: She appears well-developed and well-nourished. No distress.  HENT:  Head: Normocephalic and atraumatic.  Mouth/Throat: Oropharynx is clear and moist.  Eyes: Conjunctivae and EOM are normal. Pupils are equal, round, and reactive to light. No scleral icterus.  Neck: Normal range of motion. Neck supple. No tracheal deviation present. No thyromegaly present.  Cardiovascular: Normal rate, regular rhythm and normal heart sounds.   Pulmonary/Chest: Effort normal and breath sounds normal. No respiratory distress. She has no wheezes. She has no rales. She exhibits no tenderness.       Harsh bs throughout No rales/ rhonchi or wheezes Cough is barky   Abdominal: Soft. Bowel sounds are normal. She exhibits no distension and no mass. There is no tenderness. There is no rebound and no guarding.       No suprapubic tenderness    Musculoskeletal: She exhibits no tenderness.       No cva tenderness   Lymphadenopathy:    She has no cervical adenopathy.  Skin: Skin is warm and dry. No rash noted. No erythema. No pallor.  Psychiatric: She has a normal mood and affect.          Assessment & Plan:

## 2011-09-12 LAB — POCT UA - MICROSCOPIC ONLY

## 2011-09-13 LAB — URINE CULTURE: Organism ID, Bacteria: NO GROWTH

## 2011-09-27 ENCOUNTER — Ambulatory Visit (INDEPENDENT_AMBULATORY_CARE_PROVIDER_SITE_OTHER): Payer: Medicare Other | Admitting: Family Medicine

## 2011-09-27 ENCOUNTER — Ambulatory Visit: Payer: Medicare Other | Admitting: Family Medicine

## 2011-09-27 ENCOUNTER — Encounter: Payer: Self-pay | Admitting: Family Medicine

## 2011-09-27 ENCOUNTER — Other Ambulatory Visit (HOSPITAL_COMMUNITY)
Admission: RE | Admit: 2011-09-27 | Discharge: 2011-09-27 | Disposition: A | Discharge status: Home / Self Care | Payer: Medicare Other | Source: Ambulatory Visit | Attending: Family Medicine | Admitting: Family Medicine

## 2011-09-27 VITALS — BP 138/80 | HR 88 | Temp 98.3°F | Wt 154.2 lb

## 2011-09-27 DIAGNOSIS — M25569 Pain in unspecified knee: Secondary | ICD-10-CM

## 2011-09-27 DIAGNOSIS — R319 Hematuria, unspecified: Secondary | ICD-10-CM

## 2011-09-27 DIAGNOSIS — M25552 Pain in left hip: Secondary | ICD-10-CM

## 2011-09-27 DIAGNOSIS — M25562 Pain in left knee: Secondary | ICD-10-CM

## 2011-09-27 DIAGNOSIS — Z1159 Encounter for screening for other viral diseases: Secondary | ICD-10-CM | POA: Insufficient documentation

## 2011-09-27 DIAGNOSIS — Z01419 Encounter for gynecological examination (general) (routine) without abnormal findings: Secondary | ICD-10-CM

## 2011-09-27 DIAGNOSIS — M25559 Pain in unspecified hip: Secondary | ICD-10-CM

## 2011-09-27 NOTE — Patient Instructions (Signed)
We will refer you to orthopedics at check out  I will update you about pap And if neg- will refer to urology for the previous blood in urine

## 2011-09-27 NOTE — Progress Notes (Signed)
Subjective:    Patient ID: [name] [name], female    DOB: Dec 10, 1922, 78 y.o.   MRN: [id number]  HPI Is feeling weak in general -- sleeping a lot / but feels better overall - gradually  Here for f/u of hematuria -- with neg urine culture  No more blood in urine (all in one day)- never happened before  Did tx for uti and then urine culture   Has had hysterectomy  Was total - for fibroid tumors  No pelvic pain No known vaginal bleeding    Does take bath with epsom salts in it -- ? If could vaginally irritate   Is having a lot of problems -- L knee primarily and L hip -- in groin Wants ref to ortho in [place] - has decided to have it checked out  Problems walking now  May have to start using a cane Low back also bothers her No numbness or weakness  Patient Active Problem List  Diagnoses  . UNSPECIFIED VITAMIN D DEFICIENCY  . HYPERCHOLESTEROLEMIA  . ANXIETY  . DEPRESSION  . HYPERTENSION  . PERICARDIAL EFFUSION  . PREMATURE VENTRICULAR CONTRACTIONS  . BRADYCARDIA  . HEMORRHOIDS  . VENOUS INSUFFICIENCY  . ALLERGIC RHINITIS  . GERD  . OSTEOARTHRITIS  . GANGLION CYST  . OSTEOPENIA  . INTERMITTENT VERTIGO  . MURMUR  . CARDIAC PACEMAKER IN SITU  . Atrial fibrillation  . Cough  . Bronchitis  . Blood in urine  . UTI (lower urinary tract infection)  . Gynecological examination  . Hip pain, left  . Knee pain, left   Past Medical History  Diagnosis Date  . Allergic rhinitis   . Anxiety   . Depression   . GERD (gastroesophageal reflux disease)   . Hypertension   . Osteopenia   . Bradycardia   . Hyperlipidemia   . Duodenal ulcer    Past Surgical History  Procedure Date  . Appendectomy   . Total abdominal hysterectomy   . Tonsillectomy   . Ankle fracture surgery 05/2002  . Insert / replace / remove pacemaker    History  Substance Use Topics  . Smoking status: Never Smoker   . Smokeless tobacco: Never Used  . Alcohol Use: Yes     infrequently   Family  History  Problem Relation Age of Onset  . Heart attack Father   . Depression Mother   . Alzheimer's disease Mother   . Pneumonia Mother   . Heart disease Brother     hx of CABG   Allergies  Allergen Reactions  . Ace Inhibitors     REACTION: cough  . Alendronate Sodium     REACTION: aches  . Nitrofurantoin     REACTION: rash  . Penicillins     REACTION: rash  . Pradaxa Diarrhea    indigestion  . Prednisone Other (See Comments)    Extreme weakness   Current Outpatient Prescriptions on File Prior to Visit  Medication Sig Dispense Refill  . amLODipine (NORVASC) 5 MG tablet Take 1 tablet (5 mg total) by mouth daily.  90 tablet  3  . cetirizine (ZYRTEC) 10 MG tablet Take 10 mg by mouth daily.        . cholecalciferol (VITAMIN D) 1000 UNITS tablet Take 5,000 Units by mouth daily.        . hydrochlorothiazide (HYDRODIURIL) 25 MG tablet Take 1 tablet (25 mg total) by mouth daily.  90 tablet  3  . meclizine (ANTIVERT) 25 MG tablet Take  1 tablet (25 mg total) by mouth 3 (three) times daily as needed for dizziness or nausea.  30 tablet  0  . metoprolol tartrate (LOPRESSOR) 25 MG tablet Take 1 tablet (25 mg total) by mouth 2 (two) times daily.  60 tablet  11  . Multiple Vitamin (MULTIVITAMIN) tablet Take 1 tablet by mouth daily.        . sertraline (ZOLOFT) 100 MG tablet Take 1 tablet (100 mg total) by mouth daily.  90 tablet  3  . valsartan (DIOVAN) 160 MG tablet Take 1 tablet (160 mg total) by mouth daily.  90 tablet  3      Review of Systems Review of Systems  Constitutional: Negative for fever, appetite change, fatigue and unexpected weight change.  Eyes: Negative for pain and visual disturbance.  Respiratory: Negative for cough and shortness of breath.   Cardiovascular: Negative for cp or palpitations    Gastrointestinal: Negative for nausea, diarrhea and constipation.  Genitourinary: Negative for urgency and pos for baseline frequency, neg for any more hematuria  Neg for back  or flank pain, neg for vaginal d/c or pelvic pain  Skin: Negative for pallor or rash   MSK pos for lumbar and L hip pain Neurological: Negative for weakness, light-headedness, numbness and headaches.  Hematological: Negative for adenopathy. Does not bruise/bleed easily.  Psychiatric/Behavioral: Negative for dysphoric mood. The patient is not nervous/anxious.          Objective:   Physical Exam  Constitutional: She appears well-developed and well-nourished. No distress.  HENT:  Head: Normocephalic and atraumatic.  Mouth/Throat: Oropharynx is clear and moist.  Eyes: Conjunctivae and EOM are normal. Pupils are equal, round, and reactive to light. No scleral icterus.  Neck: Normal range of motion. Neck supple. No JVD present. No thyromegaly present.  Cardiovascular: Normal rate, regular rhythm, normal heart sounds and intact distal pulses.  Exam reveals no gallop.   Pulmonary/Chest: Effort normal and breath sounds normal. No respiratory distress. She has no wheezes.  Abdominal: Soft. Bowel sounds are normal. She exhibits no distension and no mass. There is no tenderness. There is no rebound and no guarding.       No suprapubic tenderness   No cva tenderness   Genitourinary: Rectum normal and vagina normal. There is no rash, tenderness, lesion or injury on the right labia. There is no rash, tenderness, lesion or injury on the left labia. No erythema, tenderness or bleeding around the vagina. No signs of injury around the vagina. No vaginal discharge found.       Breast exam: No mass, nodules, thickening, tenderness, bulging, retraction, inflamation, nipple discharge or skin changes noted.  No axillary or clavicular LA.  Chaperoned exam.    No source of bleeding found   o External genitalia nl appearing  o Urethral meatus nl appearing/ no polyp o Urethra  Normally mobile and nt o Bladder nl o Vagina nl mucosa- no source of bleeding found o Cervix  Sx absent o Uterus  Sx absent o  Adnexa/parametria sx absent o Anus and perineum nl appearing   Musculoskeletal: She exhibits tenderness. She exhibits no edema.       Tender over LS, no troch tenderness Pain in groin with int/ ext rotation of L hip Neg slr  Lymphadenopathy:    She has no cervical adenopathy.  Neurological: She is alert. She has normal reflexes. She exhibits normal muscle tone. Coordination normal.  Skin: Skin is warm and dry. No rash noted. No erythema. No  pallor.  Psychiatric: She has a normal mood and affect.          Assessment & Plan:

## 2011-09-29 NOTE — Assessment & Plan Note (Signed)
Suspect OA based on exam Ref to ortho

## 2011-09-29 NOTE — Assessment & Plan Note (Signed)
Neg urine cx Nl gyn exam today with hyst- no vaginal / perineal source of bleeding  Pap obt If nl will ref to urology for further eval

## 2011-09-29 NOTE — Assessment & Plan Note (Signed)
Done in light of prev hematuria with neg u cx No abn seen  S/p hyst  Pap from cervical cuff

## 2011-09-29 NOTE — Assessment & Plan Note (Signed)
With reduced rom Ref to othro as req by pt

## 2011-12-26 ENCOUNTER — Encounter: Payer: Self-pay | Admitting: Internal Medicine

## 2011-12-26 ENCOUNTER — Ambulatory Visit (INDEPENDENT_AMBULATORY_CARE_PROVIDER_SITE_OTHER): Payer: Medicare Other | Admitting: Internal Medicine

## 2011-12-26 VITALS — BP 157/103 | HR 84 | Ht 64.0 in | Wt 155.0 lb

## 2011-12-26 DIAGNOSIS — I4891 Unspecified atrial fibrillation: Secondary | ICD-10-CM

## 2011-12-26 DIAGNOSIS — Z950 Presence of cardiac pacemaker: Secondary | ICD-10-CM

## 2011-12-26 DIAGNOSIS — I10 Essential (primary) hypertension: Secondary | ICD-10-CM

## 2011-12-26 LAB — PACEMAKER DEVICE OBSERVATION
AL IMPEDENCE PM: 426 Ohm
AL THRESHOLD: 0.875 V
ATRIAL PACING PM: 97
BAMS-0001: 175 {beats}/min
RV LEAD IMPEDENCE PM: 557 Ohm
RV LEAD THRESHOLD: 0.75 V

## 2011-12-26 MED ORDER — APIXABAN 5 MG PO TABS: 5.0000 mg | ORAL_TABLET | Freq: Two times a day (BID) | ORAL | Status: DC

## 2011-12-26 NOTE — Assessment & Plan Note (Signed)
Her device is working normally. We'll plan to recheck in several months. 

## 2011-12-26 NOTE — Patient Instructions (Addendum)
Need to send in [facility] April 02, 2012. Follow up with Dr. [name] in 12 months.  Patient will start on Eliquis 5 mg take one tablet twice a day. Samples of this drug were given to the patient, quantity 76, Lot Number [id number] EXP:04/2014

## 2011-12-26 NOTE — Assessment & Plan Note (Signed)
Her blood pressure is elevated today. She admits to being anxious. I've asked the patient to decrease her salt intake. She is instructed to keep a log of her blood pressure.

## 2011-12-26 NOTE — Progress Notes (Signed)
HPI [name] [name] returns today for followup. She is a very pleasant 78 year old woman with a history of symptomatic bradycardia status post permanent pacemaker insertion. She has a history of intolerance to anticoagulation. She was unable to take Pradaxa. She denies chest pain, shortness of breath, palpitations, or peripheral edema. No syncope. Allergies  Allergen Reactions  . Ace Inhibitors     REACTION: cough  . Alendronate Sodium     REACTION: aches  . Dabigatran Etexilate Mesylate Diarrhea    indigestion  . Nitrofurantoin     REACTION: rash  . Penicillins     REACTION: rash  . Prednisone Other (See Comments)    Extreme weakness     Current Outpatient Prescriptions  Medication Sig Dispense Refill  . amLODipine (NORVASC) 5 MG tablet Take 1 tablet (5 mg total) by mouth daily.  90 tablet  3  . cetirizine (ZYRTEC) 10 MG tablet Take 10 mg by mouth daily.        . cholecalciferol (VITAMIN D) 1000 UNITS tablet Take 5,000 Units by mouth daily.        . hydrochlorothiazide (HYDRODIURIL) 25 MG tablet Take 1 tablet (25 mg total) by mouth daily.  90 tablet  3  . meclizine (ANTIVERT) 25 MG tablet Take 1 tablet (25 mg total) by mouth 3 (three) times daily as needed for dizziness or nausea.  30 tablet  0  . metoprolol tartrate (LOPRESSOR) 25 MG tablet Take 1 tablet (25 mg total) by mouth 2 (two) times daily.  60 tablet  11  . Multiple Vitamin (MULTIVITAMIN) tablet Take 1 tablet by mouth daily.        . sertraline (ZOLOFT) 100 MG tablet Take 1 tablet (100 mg total) by mouth daily.  90 tablet  3  . valsartan (DIOVAN) 160 MG tablet Take 1 tablet (160 mg total) by mouth daily.  90 tablet  3     Past Medical History  Diagnosis Date  . Allergic rhinitis   . Anxiety   . Depression   . GERD (gastroesophageal reflux disease)   . Hypertension   . Osteopenia   . Bradycardia   . Hyperlipidemia   . Duodenal ulcer     ROS:   All systems reviewed and negative except as noted in the  HPI.   Past Surgical History  Procedure Date  . Appendectomy   . Total abdominal hysterectomy   . Tonsillectomy   . Ankle fracture surgery 05/2002  . Insert / replace / remove pacemaker      Family History  Problem Relation Age of Onset  . Heart attack Father   . Depression Mother   . Alzheimer's disease Mother   . Pneumonia Mother   . Heart disease Brother     hx of CABG     History   Social History  . Marital Status: Married    Spouse Name: N/A    Number of Children: N/A  . Years of Education: N/A   Occupational History  . Not on file.   Social History Main Topics  . Smoking status: Never Smoker   . Smokeless tobacco: Never Used  . Alcohol Use: Yes     infrequently  . Drug Use: No  . Sexually Active:    Other Topics Concern  . Not on file   Social History Narrative  . No narrative on file     BP 157/103  Pulse 84  Ht 5\' 4"  (1.626 m)  Wt 155 lb (70.308 kg)  BMI  26.61 kg/m2  Physical Exam:  Well appearing elderly woman, NAD HEENT: Unremarkable Neck:  No JVD, no thyromegally Lungs:  Clear with no wheezes, rales, or rhonchi. HEART:  Regular rate rhythm, no murmurs, no rubs, no clicks Abd:  soft, positive bowel sounds, no organomegally, no rebound, no guarding Ext:  2 plus pulses, no edema, no cyanosis, no clubbing Skin:  No rashes no nodules Neuro:  CN II through XII intact, motor grossly intact  DEVICE  Normal device function.  See PaceArt for details.   Assess/Plan:

## 2011-12-26 NOTE — Assessment & Plan Note (Signed)
I have discussed the treatment options with the patient. She was intolerant of Pradaxa and I will try her on Apixiban. We'll plan to see her back in several months.

## 2012-01-01 ENCOUNTER — Telehealth: Payer: Self-pay | Admitting: *Deleted

## 2012-01-01 NOTE — Telephone Encounter (Signed)
Prior Authorization for Eliquis has been approved starting Jan 01, 2012- Dec 31, 2012.

## 2012-01-02 ENCOUNTER — Telehealth: Payer: Self-pay | Admitting: *Deleted

## 2012-01-02 ENCOUNTER — Telehealth: Payer: Self-pay | Admitting: Internal Medicine

## 2012-01-02 NOTE — Telephone Encounter (Signed)
Pt calling concerning her eliquis she can not afford it. Wants to know if she can switch to something else.

## 2012-01-02 NOTE — Telephone Encounter (Signed)
She has Medicare and the card will not cover.  She has tried Pradaxa and could not tolerate due to GI side effects.  She has been on Xarelto since 5/2 and doing fine just too expensive.  It was going to cost her $1,100 per year mail order/ $91/month and 20 pills at local pharmacy was $86.83  What is the alternative.  Let her know I will discuss with Dr [name] and call her back this afternoon

## 2012-01-02 NOTE — Telephone Encounter (Signed)
Will forward to Dr. Taylor's nurse. 

## 2012-01-06 ENCOUNTER — Telehealth: Payer: Self-pay | Admitting: *Deleted

## 2012-01-06 NOTE — Telephone Encounter (Signed)
Error--nt 

## 2012-01-06 NOTE — Telephone Encounter (Signed)
Error--nterbeck

## 2012-01-07 NOTE — Telephone Encounter (Signed)
F/u  Patient called for f/u status regarding medication.

## 2012-01-07 NOTE — Telephone Encounter (Signed)
She has 2 more weeks of samples and then she is going to try and get it through the mail.  Offered Coumadin and she denied.  She will call me back if she decides to go with Coumadin.  I explained to her if she stops she is at risk for a CVA

## 2012-01-08 ENCOUNTER — Telehealth: Payer: Self-pay | Admitting: Internal Medicine

## 2012-01-08 DIAGNOSIS — I4891 Unspecified atrial fibrillation: Secondary | ICD-10-CM

## 2012-01-08 NOTE — Telephone Encounter (Signed)
Pt calling re a prescription eliquis,  she talked to [facility] yesterday, needs written rx mail to pt

## 2012-01-08 NOTE — Telephone Encounter (Signed)
Follow-up:     Patient would like you to mail her prescription to her house and the patient will mail it to her mail order.  Please call back if you have any questions.

## 2012-01-08 NOTE — Telephone Encounter (Signed)
lmom for pt to call me in regards to her Rx  We can send it electronically for mail order  Just need the name of place to send to

## 2012-01-10 MED ORDER — APIXABAN 5 MG PO TABS: 5.0000 mg | ORAL_TABLET | Freq: Two times a day (BID) | ORAL | Status: DC

## 2012-01-10 NOTE — Telephone Encounter (Signed)
Med printed and will mail to her home

## 2012-01-24 ENCOUNTER — Ambulatory Visit: Payer: Medicare Other | Admitting: Family Medicine

## 2012-02-14 ENCOUNTER — Ambulatory Visit: Payer: Medicare Other | Admitting: Family Medicine

## 2012-02-24 ENCOUNTER — Ambulatory Visit (INDEPENDENT_AMBULATORY_CARE_PROVIDER_SITE_OTHER): Payer: Medicare Other | Admitting: Family Medicine

## 2012-02-24 ENCOUNTER — Encounter: Payer: Self-pay | Admitting: Family Medicine

## 2012-02-24 VITALS — BP 139/92 | HR 69 | Temp 98.2°F | Wt 156.5 lb

## 2012-02-24 DIAGNOSIS — I4891 Unspecified atrial fibrillation: Secondary | ICD-10-CM

## 2012-02-24 DIAGNOSIS — F411 Generalized anxiety disorder: Secondary | ICD-10-CM

## 2012-02-24 DIAGNOSIS — I10 Essential (primary) hypertension: Secondary | ICD-10-CM

## 2012-02-24 MED ORDER — SERTRALINE HCL 100 MG PO TABS: ORAL_TABLET | ORAL | Status: DC

## 2012-02-24 NOTE — Assessment & Plan Note (Signed)
This has worsened - especially since starting the last blood thinner - apixaban  Unsure if related to this or to age/ stressors Disc coping skills/ health habits Will inc her zoloft to 150 mg daily- and inst to update if worse or side eff F/u as planned Urged to keep exercising

## 2012-02-24 NOTE — Patient Instructions (Addendum)
Increase zoloft to 1 1/2 pills once daily  If this does not start to help mood and sleep in 2 weeks let me know  Keep active and exercise  Take good care of yourself  Follow up as planned

## 2012-02-24 NOTE — Progress Notes (Signed)
Subjective:    Patient ID: [name] [name], female    DOB: 11/13/1922, 78 y.o.   MRN: [id number]  HPI Here for f/u of HTN   Also anxiety  Not sleeping well No patience  Flying off the handle  No new stress -nothing new out of the ordinary Is active and walking and working in the yard  She likes to be doing something   She is taking good care of herself   She has started a blood thinner  She is aware that this could decrease effectiveness of her zoloft   bp 139/92Today BP Readings from Last 3 Encounters:  02/24/12 139/92  12/26/11 157/103  09/27/11 138/80   Dr [name] has been following  No cp or palpitations or headaches or edema  No side effects to medicines    Pt admits to being keyed up   Patient Active Problem List  Diagnosis  . UNSPECIFIED VITAMIN D DEFICIENCY  . HYPERCHOLESTEROLEMIA  . ANXIETY  . DEPRESSION  . HYPERTENSION  . PERICARDIAL EFFUSION  . PREMATURE VENTRICULAR CONTRACTIONS  . BRADYCARDIA  . HEMORRHOIDS  . VENOUS INSUFFICIENCY  . ALLERGIC RHINITIS  . GERD  . OSTEOARTHRITIS  . GANGLION CYST  . OSTEOPENIA  . INTERMITTENT VERTIGO  . MURMUR  . CARDIAC PACEMAKER IN SITU  . Atrial fibrillation  . Cough  . Bronchitis  . Blood in urine  . UTI (lower urinary tract infection)  . Gynecological examination  . Hip pain, left  . Knee pain, left   Past Medical History  Diagnosis Date  . Allergic rhinitis   . Anxiety   . Depression   . GERD (gastroesophageal reflux disease)   . Hypertension   . Osteopenia   . Bradycardia   . Hyperlipidemia   . Duodenal ulcer   . Other specified cardiac dysrhythmias    Past Surgical History  Procedure Date  . Appendectomy   . Total abdominal hysterectomy   . Tonsillectomy   . Ankle fracture surgery 05/2002  . Insert / replace / remove pacemaker    History  Substance Use Topics  . Smoking status: Never Smoker   . Smokeless tobacco: Never Used  . Alcohol Use: Yes     infrequently   Family  History  Problem Relation Age of Onset  . Heart attack Father   . Depression Mother   . Alzheimer's disease Mother   . Pneumonia Mother   . Heart disease Brother     hx of CABG   Allergies  Allergen Reactions  . Ace Inhibitors     REACTION: cough  . Alendronate Sodium     REACTION: aches  . Dabigatran Etexilate Mesylate Diarrhea    indigestion  . Nitrofurantoin     REACTION: rash  . Penicillins     REACTION: rash  . Prednisone Other (See Comments)    Extreme weakness   Current Outpatient Prescriptions on File Prior to Visit  Medication Sig Dispense Refill  . amLODipine (NORVASC) 5 MG tablet Take 1 tablet (5 mg total) by mouth daily.  90 tablet  3  . apixaban (ELIQUIS) 5 MG TABS tablet Take 1 tablet (5 mg total) by mouth 2 (two) times daily.  180 tablet  3  . cetirizine (ZYRTEC) 10 MG tablet Take 10 mg by mouth daily.        . cholecalciferol (VITAMIN D) 1000 UNITS tablet Take 5,000 Units by mouth daily.        . hydrochlorothiazide (HYDRODIURIL) 25 MG tablet Take  1 tablet (25 mg total) by mouth daily.  90 tablet  3  . metoprolol tartrate (LOPRESSOR) 25 MG tablet Take 1 tablet (25 mg total) by mouth 2 (two) times daily.  60 tablet  11  . Multiple Vitamin (MULTIVITAMIN) tablet Take 1 tablet by mouth daily.        . sertraline (ZOLOFT) 100 MG tablet Take 1 tablet (100 mg total) by mouth daily.  90 tablet  3  . valsartan (DIOVAN) 160 MG tablet Take 1 tablet (160 mg total) by mouth daily.  90 tablet  3      Review of Systems Review of Systems  Constitutional: Negative for fever, appetite change, fatigue and unexpected weight change.  Eyes: Negative for pain and visual disturbance.  Respiratory: Negative for cough and shortness of breath.   Cardiovascular: Negative for cp or palpitations    Gastrointestinal: Negative for nausea, diarrhea and constipation.  Genitourinary: Negative for urgency and frequency.  Skin: Negative for pallor or rash   Neurological: Negative for  weakness, light-headedness, numbness and headaches.  Hematological: Negative for adenopathy. Does not bruise/bleed easily.  Psychiatric/Behavioral: Negative for dysphoric mood. The patient is quite anxious and irritable, no SI or HI       Objective:   Physical Exam  Constitutional: She appears well-developed and well-nourished.       Well appearing elderly female  HENT:  Head: Normocephalic and atraumatic.  Mouth/Throat: Oropharynx is clear and moist.  Eyes: Conjunctivae and EOM are normal. Pupils are equal, round, and reactive to light. No scleral icterus.  Neck: Normal range of motion. Neck supple. Carotid bruit is not present. No thyromegaly present.  Cardiovascular: Normal rate and normal heart sounds.   Pulmonary/Chest: Effort normal and breath sounds normal. No respiratory distress. She has no wheezes.       No crackles   Abdominal: She exhibits no abdominal bruit.  Musculoskeletal: She exhibits no edema and no tenderness.  Lymphadenopathy:    She has no cervical adenopathy.  Neurological: She is alert. She has normal reflexes. No cranial nerve deficit. She exhibits normal muscle tone. Coordination normal.  Skin: Skin is warm and dry. No rash noted. No erythema. No pallor.  Psychiatric: Her speech is normal. Judgment and thought content normal. Her mood appears anxious. Her affect is not angry and not inappropriate. She is agitated. She is not withdrawn. Cognition and memory are normal. She expresses no homicidal and no suicidal ideation. She expresses no suicidal plans and no homicidal plans. She is attentive.          Assessment & Plan:

## 2012-02-24 NOTE — Assessment & Plan Note (Signed)
Improved today after relaxing - anxiety may falsely elevate reading Will work on that  Good habits Continue to follow

## 2012-02-25 NOTE — Assessment & Plan Note (Signed)
Disc pros and cons of anticoagulation - and current med is doing better for her  ? If any interference with SSRI- since anx is worse - will inc dose of zoloft and watch closely Urged her to disc this with her cardiologist

## 2012-04-02 ENCOUNTER — Encounter: Payer: Self-pay | Admitting: Internal Medicine

## 2012-04-02 ENCOUNTER — Ambulatory Visit (INDEPENDENT_AMBULATORY_CARE_PROVIDER_SITE_OTHER): Payer: Medicare Other | Admitting: *Deleted

## 2012-04-02 DIAGNOSIS — I498 Other specified cardiac arrhythmias: Secondary | ICD-10-CM

## 2012-04-03 ENCOUNTER — Encounter: Payer: Self-pay | Admitting: *Deleted

## 2012-04-03 LAB — REMOTE PACEMAKER DEVICE
AL IMPEDENCE PM: 426 Ohm
AL THRESHOLD: 0.875 V
ATRIAL PACING PM: 98
BAMS-0001: 175 {beats}/min
BATTERY VOLTAGE: 2.79 V
RV LEAD AMPLITUDE: 5.6 mv
RV LEAD IMPEDENCE PM: 580 Ohm
RV LEAD THRESHOLD: 0.875 V
VENTRICULAR PACING PM: 2

## 2012-04-07 ENCOUNTER — Encounter: Payer: Self-pay | Admitting: *Deleted

## 2012-05-15 ENCOUNTER — Encounter: Payer: Self-pay | Admitting: Family Medicine

## 2012-05-15 ENCOUNTER — Ambulatory Visit (INDEPENDENT_AMBULATORY_CARE_PROVIDER_SITE_OTHER): Payer: Medicare Other | Admitting: Family Medicine

## 2012-05-15 VITALS — BP 132/74 | HR 84 | Temp 98.4°F | Ht 64.0 in | Wt 156.0 lb

## 2012-05-15 DIAGNOSIS — R6889 Other general symptoms and signs: Secondary | ICD-10-CM

## 2012-05-15 DIAGNOSIS — F329 Major depressive disorder, single episode, unspecified: Secondary | ICD-10-CM

## 2012-05-15 DIAGNOSIS — Z23 Encounter for immunization: Secondary | ICD-10-CM

## 2012-05-15 MED ORDER — SERTRALINE HCL 100 MG PO TABS: ORAL_TABLET | ORAL | Status: DC

## 2012-05-15 MED ORDER — AMLODIPINE BESYLATE 5 MG PO TABS: 5.0000 mg | ORAL_TABLET | Freq: Every day | ORAL | Status: DC

## 2012-05-15 MED ORDER — HYDROCHLOROTHIAZIDE 25 MG PO TABS: 25.0000 mg | ORAL_TABLET | Freq: Every day | ORAL | Status: DC

## 2012-05-15 MED ORDER — METOPROLOL TARTRATE 25 MG PO TABS: 25.0000 mg | ORAL_TABLET | Freq: Two times a day (BID) | ORAL | Status: DC

## 2012-05-15 MED ORDER — VALSARTAN 160 MG PO TABS: 160.0000 mg | ORAL_TABLET | Freq: Every day | ORAL | Status: DC

## 2012-05-15 NOTE — Patient Instructions (Addendum)
We will watch your memory closely- right now I think you have an expected amount of age related memory loss You had not problems on memory test  Follow up if you think you are getting worse  No change in medicines Flu shot today Pneumonia vaccine today I sent your px to [facility] Schedule annual exam in spring with labs prior

## 2012-05-15 NOTE — Progress Notes (Signed)
Subjective:    Patient ID: [name] [name], female    DOB: 18-Nov-1922, 78 y.o.   MRN: [id number]  HPI Here for a memory issue Went to a dental appt on the wrong day and thought it was the wrong day  Problems remembering names on the spot - will come to her later  No prob with long term memory  Most of this is short term memory  Walks into a room and forgot why   No confusion Has not become lost   Mood - seems to be better  Just not rested at night Not sad or tearful  Not a worrier Getting 5-6 hours of sleep at night- wakes up frequently  Friends and family -- have not said anything  No headaches  Does get stiff neck in ams - ? From pillow   Performed folstein MMS test today  Did very well  29/30   Also clock draw- is very good   Patient Active Problem List  Diagnosis  . UNSPECIFIED VITAMIN D DEFICIENCY  . HYPERCHOLESTEROLEMIA  . ANXIETY  . DEPRESSION  . HYPERTENSION  . PERICARDIAL EFFUSION  . PREMATURE VENTRICULAR CONTRACTIONS  . BRADYCARDIA  . HEMORRHOIDS  . VENOUS INSUFFICIENCY  . ALLERGIC RHINITIS  . GERD  . OSTEOARTHRITIS  . GANGLION CYST  . OSTEOPENIA  . INTERMITTENT VERTIGO  . MURMUR  . PPM-Medtronic  . Atrial fibrillation  . Cough  . Bronchitis  . Blood in urine  . UTI (lower urinary tract infection)  . Gynecological examination  . Hip pain, left  . Knee pain, left  . Forgetfulness   Past Medical History  Diagnosis Date  . Allergic rhinitis   . Anxiety   . Depression   . GERD (gastroesophageal reflux disease)   . Hypertension   . Osteopenia   . Bradycardia   . Hyperlipidemia   . Duodenal ulcer   . Other specified cardiac dysrhythmias    Past Surgical History  Procedure Date  . Appendectomy   . Total abdominal hysterectomy   . Tonsillectomy   . Ankle fracture surgery 05/2002  . Insert / replace / remove pacemaker    History  Substance Use Topics  . Smoking status: Never Smoker   . Smokeless tobacco: Never Used  . Alcohol  Use: Yes     infrequently   Family History  Problem Relation Age of Onset  . Heart attack Father   . Depression Mother   . Alzheimer's disease Mother   . Pneumonia Mother   . Heart disease Brother     hx of CABG   Allergies  Allergen Reactions  . Ace Inhibitors     REACTION: cough  . Alendronate Sodium     REACTION: aches  . Dabigatran Etexilate Mesylate Diarrhea    indigestion  . Nitrofurantoin     REACTION: rash  . Penicillins     REACTION: rash  . Prednisone Other (See Comments)    Extreme weakness   Current Outpatient Prescriptions on File Prior to Visit  Medication Sig Dispense Refill  . amLODipine (NORVASC) 5 MG tablet Take 1 tablet (5 mg total) by mouth daily.  90 tablet  3  . apixaban (ELIQUIS) 5 MG TABS tablet Take 1 tablet (5 mg total) by mouth 2 (two) times daily.  180 tablet  3  . cholecalciferol (VITAMIN D) 1000 UNITS tablet Take 5,000 Units by mouth daily.        . hydrochlorothiazide (HYDRODIURIL) 25 MG tablet Take 1 tablet (25  mg total) by mouth daily.  90 tablet  3  . metoprolol tartrate (LOPRESSOR) 25 MG tablet Take 1 tablet (25 mg total) by mouth 2 (two) times daily.  60 tablet  11  . Multiple Vitamin (MULTIVITAMIN) tablet Take 1 tablet by mouth daily.        . sertraline (ZOLOFT) 100 MG tablet Take 1 1/2 pills by mouth once daily  135 tablet  3  . valsartan (DIOVAN) 160 MG tablet Take 1 tablet (160 mg total) by mouth daily.  90 tablet  3  . cetirizine (ZYRTEC) 10 MG tablet Take 10 mg by mouth daily.               Review of Systems Review of Systems  Constitutional: Negative for fever, appetite change, fatigue and unexpected weight change.  Eyes: Negative for pain and visual disturbance.  Respiratory: Negative for cough and shortness of breath.   Cardiovascular: Negative for cp or palpitations    Gastrointestinal: Negative for nausea, diarrhea and constipation.  Genitourinary: Negative for urgency and frequency.  Skin: Negative for pallor or rash    Neurological: Negative for weakness, light-headedness, numbness and headaches.  Hematological: Negative for adenopathy. Does not bruise/bleed easily.  Psychiatric/Behavioral: pos for depression and anxiety that are improved at this time without medicines, pos for fogetfulness       Objective:   Physical Exam  Constitutional: She appears well-developed and well-nourished. No distress.  HENT:  Head: Normocephalic and atraumatic.  Mouth/Throat: Oropharynx is clear and moist.  Eyes: EOM are normal. Pupils are equal, round, and reactive to light. No scleral icterus.  Neck: Normal range of motion. Neck supple. No JVD present. No thyromegaly present.  Cardiovascular: Normal rate, regular rhythm and intact distal pulses.  Exam reveals no gallop.   Pulmonary/Chest: Effort normal and breath sounds normal. No respiratory distress. She has no wheezes.  Abdominal: Soft. Bowel sounds are normal. She exhibits no distension. There is no tenderness.  Musculoskeletal: She exhibits no edema and no tenderness.  Lymphadenopathy:    She has no cervical adenopathy.  Neurological: She is alert. She has normal reflexes. No cranial nerve deficit. She exhibits normal muscle tone. Coordination normal.       MMS exam done today- scanned into chart Score 29/30 (missed the date by one day)  Clock draw normal   Skin: Skin is warm and dry. No rash noted. No erythema. No pallor.  Psychiatric: She has a normal mood and affect. Her speech is normal and behavior is normal. Judgment normal. Her mood appears not anxious. Her affect is not blunt and not labile. Cognition and memory are normal. She does not exhibit a depressed mood.          Assessment & Plan:

## 2012-05-15 NOTE — Assessment & Plan Note (Signed)
This is improved with inc in zoloft- doing well  Disc sleep hygeine- sleep problem may be age related as well

## 2012-05-15 NOTE — Assessment & Plan Note (Signed)
Mild and pt aware  Near perfect MMS and clock draw Not worrisome now  Will continue to watch closely Expect age rel nl memory loss Nl neuro exam

## 2012-07-06 ENCOUNTER — Ambulatory Visit (INDEPENDENT_AMBULATORY_CARE_PROVIDER_SITE_OTHER): Payer: Medicare Other | Admitting: *Deleted

## 2012-07-06 DIAGNOSIS — I498 Other specified cardiac arrhythmias: Secondary | ICD-10-CM

## 2012-07-06 DIAGNOSIS — Z950 Presence of cardiac pacemaker: Secondary | ICD-10-CM

## 2012-07-08 ENCOUNTER — Encounter: Payer: Self-pay | Admitting: Internal Medicine

## 2012-07-09 LAB — REMOTE PACEMAKER DEVICE
AL THRESHOLD: 0.875 V
BAMS-0001: 175 {beats}/min
RV LEAD AMPLITUDE: 16 mv
VENTRICULAR PACING PM: 2

## 2012-07-14 ENCOUNTER — Encounter: Payer: Self-pay | Admitting: *Deleted

## 2012-10-05 ENCOUNTER — Telehealth: Payer: Self-pay

## 2012-10-05 NOTE — Telephone Encounter (Signed)
Done and in IN box 

## 2012-10-05 NOTE — Telephone Encounter (Signed)
Prior auth needed for Diovan .Form on Dr Royden Purl shelf.

## 2012-10-05 NOTE — Telephone Encounter (Signed)
Form faxed

## 2012-10-06 ENCOUNTER — Telehealth: Payer: Self-pay

## 2012-10-06 DIAGNOSIS — I4891 Unspecified atrial fibrillation: Secondary | ICD-10-CM

## 2012-10-06 NOTE — Telephone Encounter (Signed)
No other ARBs- so if they prefer another please get a list of which ones so we can choose (and let pt know)

## 2012-10-06 NOTE — Telephone Encounter (Signed)
[employer] left v/m requesting additional info for pts' request for formulary exception for diovan.[employer] request what meds in that same class pt has tried or failed.Please advise.

## 2012-10-07 NOTE — Telephone Encounter (Signed)
Called pt's insurance and the alt medications they cover are losartan potassium 50 mg and 100 mg, they also cover irbesartan 150 mg, and candesartan cilexetil 16 mg

## 2012-10-08 MED ORDER — IRBESARTAN 150 MG PO TABS: 150.0000 mg | ORAL_TABLET | Freq: Every day | ORAL | Status: DC

## 2012-10-08 NOTE — Telephone Encounter (Signed)
Will change to avapro 150 Please call it in  If any problems or side effects, please let me know

## 2012-10-12 ENCOUNTER — Ambulatory Visit (INDEPENDENT_AMBULATORY_CARE_PROVIDER_SITE_OTHER): Payer: Medicare Other | Admitting: *Deleted

## 2012-10-12 DIAGNOSIS — Z950 Presence of cardiac pacemaker: Secondary | ICD-10-CM

## 2012-10-12 DIAGNOSIS — I498 Other specified cardiac arrhythmias: Secondary | ICD-10-CM

## 2012-10-12 MED ORDER — METOPROLOL TARTRATE 25 MG PO TABS: 25.0000 mg | ORAL_TABLET | Freq: Two times a day (BID) | ORAL | Status: DC

## 2012-10-12 MED ORDER — HYDROCHLOROTHIAZIDE 25 MG PO TABS: 25.0000 mg | ORAL_TABLET | Freq: Every day | ORAL | Status: DC

## 2012-10-12 MED ORDER — SERTRALINE HCL 100 MG PO TABS: ORAL_TABLET | ORAL | Status: DC

## 2012-10-12 MED ORDER — IRBESARTAN 150 MG PO TABS: 150.0000 mg | ORAL_TABLET | Freq: Every day | ORAL | Status: DC

## 2012-10-12 MED ORDER — APIXABAN 5 MG PO TABS: 5.0000 mg | ORAL_TABLET | Freq: Two times a day (BID) | ORAL | Status: DC

## 2012-10-12 MED ORDER — AMLODIPINE BESYLATE 5 MG PO TABS: 5.0000 mg | ORAL_TABLET | Freq: Every day | ORAL | Status: DC

## 2012-10-12 NOTE — Telephone Encounter (Signed)
Pt left v/m wanting to know if Dr Milinda Antis received formulary exception form for Diovan. Pt request call back.

## 2012-10-12 NOTE — Telephone Encounter (Signed)
Called pt and let her know of Rx change pt was ok with that and also requested refills on 5 other Rxs

## 2012-10-12 NOTE — Telephone Encounter (Signed)
[employer], Tonya left v/m Diovan request has been denied.

## 2012-10-13 ENCOUNTER — Other Ambulatory Visit: Payer: Self-pay | Admitting: Internal Medicine

## 2012-10-21 ENCOUNTER — Encounter: Payer: Self-pay | Admitting: Family Medicine

## 2012-10-21 ENCOUNTER — Ambulatory Visit (INDEPENDENT_AMBULATORY_CARE_PROVIDER_SITE_OTHER): Payer: Medicare Other | Admitting: Family Medicine

## 2012-10-21 VITALS — BP 132/84 | HR 80 | Temp 98.5°F | Ht 64.0 in | Wt 156.2 lb

## 2012-10-21 DIAGNOSIS — I998 Other disorder of circulatory system: Secondary | ICD-10-CM

## 2012-10-21 DIAGNOSIS — I959 Hypotension, unspecified: Secondary | ICD-10-CM | POA: Insufficient documentation

## 2012-10-21 DIAGNOSIS — I10 Essential (primary) hypertension: Secondary | ICD-10-CM

## 2012-10-21 DIAGNOSIS — R58 Hemorrhage, not elsewhere classified: Secondary | ICD-10-CM | POA: Insufficient documentation

## 2012-10-21 LAB — REMOTE PACEMAKER DEVICE
AL THRESHOLD: 0.75 V
BATTERY VOLTAGE: 2.78 V
RV LEAD AMPLITUDE: 16 mv
RV LEAD IMPEDENCE PM: 537 Ohm
VENTRICULAR PACING PM: 2

## 2012-10-21 NOTE — Patient Instructions (Addendum)
Stop your amlodipine and if blood pressure gets low again let me know  I will see you back in 2 weeks as planned Let your cardiologist know about the bruising if you are worried about it  Try to avoid falls - if you need a walker go ahead and get one

## 2012-10-21 NOTE — Assessment & Plan Note (Signed)
Hematoma R groin after trauma- as expected on blood thinner  Looks to be resolving  Disc safety re: fall risk/ trauma  Will continue to monitor

## 2012-10-21 NOTE — Assessment & Plan Note (Signed)
Lately several episodes with weak feeling and bp at 90/50 (pt thinks) Is on several agents No orthostasis today Stop amlodipine for now and f/u 2 wk as planned

## 2012-10-21 NOTE — Progress Notes (Signed)
Subjective:    Patient ID: [name] [name], female    DOB: [birth year], 79 y.o.   MRN: [id number]  HPI Here for low bp and weakness  Wt is stable bp now is 132/84  Has had several episodes of feeling "real weak"  Then she checks her bp and it is low- as low as 90/50 (she thinks) No cp or palpitations  Will sometimes see black spots Not dizzy  Usually happens after she eats mid day   Is on avapro and amlodipine  And hctz   She is currently on eliquis 5 mg - and bruising more with other medicines  Did have one trip and fall- and has a big bruise on R hip- that is resolving  Patient Active Problem List  Diagnosis  . UNSPECIFIED VITAMIN D DEFICIENCY  . HYPERCHOLESTEROLEMIA  . ANXIETY  . DEPRESSION  . HYPERTENSION  . PERICARDIAL EFFUSION  . PREMATURE VENTRICULAR CONTRACTIONS  . BRADYCARDIA  . HEMORRHOIDS  . VENOUS INSUFFICIENCY  . ALLERGIC RHINITIS  . GERD  . OSTEOARTHRITIS  . GANGLION CYST  . OSTEOPENIA  . INTERMITTENT VERTIGO  . MURMUR  . PPM-Medtronic  . Atrial fibrillation  . Cough  . Bronchitis  . Blood in urine  . UTI (lower urinary tract infection)  . Gynecological examination  . Hip pain, left  . Knee pain, left  . Forgetfulness   Past Medical History  Diagnosis Date  . Allergic rhinitis   . Anxiety   . Depression   . GERD (gastroesophageal reflux disease)   . Hypertension   . Osteopenia   . Bradycardia   . Hyperlipidemia   . Duodenal ulcer   . Other specified cardiac dysrhythmias    Past Surgical History  Procedure Laterality Date  . Appendectomy    . Total abdominal hysterectomy    . Tonsillectomy    . Ankle fracture surgery  05/2002  . Insert / replace / remove pacemaker     History  Substance Use Topics  . Smoking status: Never Smoker   . Smokeless tobacco: Never Used  . Alcohol Use: Yes     Comment: infrequently   Family History  Problem Relation Age of Onset  . Heart attack Father   . Depression Mother   . Alzheimer's  disease Mother   . Pneumonia Mother   . Heart disease Brother     hx of CABG   Allergies  Allergen Reactions  . Ace Inhibitors     REACTION: cough  . Alendronate Sodium     REACTION: aches  . Dabigatran Etexilate Mesylate Diarrhea    indigestion  . Nitrofurantoin     REACTION: rash  . Penicillins     REACTION: rash  . Prednisone Other (See Comments)    Extreme weakness   Current Outpatient Prescriptions on File Prior to Visit  Medication Sig Dispense Refill  . amLODipine (NORVASC) 5 MG tablet Take 1 tablet (5 mg total) by mouth daily.  90 tablet  1  . apixaban (ELIQUIS) 5 MG TABS tablet Take 1 tablet (5 mg total) by mouth 2 (two) times daily.  180 tablet  1  . cetirizine (ZYRTEC) 10 MG tablet Take 10 mg by mouth daily.        . cholecalciferol (VITAMIN D) 1000 UNITS tablet Take 5,000 Units by mouth daily.        . hydrochlorothiazide (HYDRODIURIL) 25 MG tablet Take 1 tablet (25 mg total) by mouth daily.  90 tablet  1  . irbesartan (  AVAPRO) 150 MG tablet Take 1 tablet (150 mg total) by mouth at bedtime.  90 tablet  1  . metoprolol tartrate (LOPRESSOR) 25 MG tablet Take 1 tablet (25 mg total) by mouth 2 (two) times daily.  180 tablet  1  . Multiple Vitamin (MULTIVITAMIN) tablet Take 1 tablet by mouth daily.        . sertraline (ZOLOFT) 100 MG tablet Take 1 1/2 pills by mouth once daily  135 tablet  1   No current facility-administered medications on file prior to visit.       Review of Systems Review of Systems  Constitutional: Negative for fever, appetite change,  and unexpected weight change.  Eyes: Negative for pain and visual disturbance.  Respiratory: Negative for cough and shortness of breath.   Cardiovascular: Negative for cp or palpitations    Gastrointestinal: Negative for nausea, diarrhea and constipation.  Genitourinary: Negative for urgency and frequency.  Skin: Negative for pallor or rash  pos for bruise Neurological: Negative for , numbness and headaches,  facial droop or slurred speech- pos for lightheadedness and generalized weakness when bp goes low Hematological: Negative for adenopathy. Does not bruise/bleed easily.  Psychiatric/Behavioral: Negative for dysphoric mood. The patient is not nervous/anxious.         Objective:   Physical Exam  Constitutional: She appears well-developed and well-nourished. No distress.  Elderly female in no distress  HENT:  Head: Normocephalic and atraumatic.  Mouth/Throat: Oropharynx is clear and moist.  Eyes: Conjunctivae are normal. Pupils are equal, round, and reactive to light. Right eye exhibits no discharge. Left eye exhibits no discharge. No scleral icterus.  Neck: Normal range of motion. Neck supple. No JVD present. Carotid bruit is not present. No thyromegaly present.  Cardiovascular: Normal rate, normal heart sounds and intact distal pulses.  Exam reveals no gallop.   Pulmonary/Chest: Effort normal and breath sounds normal. No respiratory distress. She has no wheezes. She has no rales. She exhibits no tenderness.  Abdominal: Soft. Bowel sounds are normal. She exhibits no distension, no abdominal bruit and no mass. There is no tenderness.  Musculoskeletal: She exhibits no edema and no tenderness.  Lymphadenopathy:    She has no cervical adenopathy.  Neurological: She is alert. She has normal reflexes. No cranial nerve deficit. She exhibits normal muscle tone. Coordination normal.  Skin: Skin is warm and dry. No rash noted. No erythema. No pallor.  Ecchymosis over R groin area with palpable hematoma- is clearing in the middle --looks to be resolving -no tenderness   Psychiatric: She has a normal mood and affect.          Assessment & Plan:

## 2012-10-22 NOTE — Assessment & Plan Note (Signed)
Having episodes of hypotension so amlodipine was stopped Pt will monitor and update if changes

## 2012-11-02 ENCOUNTER — Telehealth (INDEPENDENT_AMBULATORY_CARE_PROVIDER_SITE_OTHER): Payer: Medicare Other | Admitting: Family Medicine

## 2012-11-02 ENCOUNTER — Other Ambulatory Visit (INDEPENDENT_AMBULATORY_CARE_PROVIDER_SITE_OTHER): Payer: Medicare Other

## 2012-11-02 DIAGNOSIS — I10 Essential (primary) hypertension: Secondary | ICD-10-CM

## 2012-11-02 DIAGNOSIS — K219 Gastro-esophageal reflux disease without esophagitis: Secondary | ICD-10-CM

## 2012-11-02 DIAGNOSIS — E559 Vitamin D deficiency, unspecified: Secondary | ICD-10-CM

## 2012-11-02 DIAGNOSIS — M949 Disorder of cartilage, unspecified: Secondary | ICD-10-CM

## 2012-11-02 DIAGNOSIS — E7800 Pure hypercholesterolemia, unspecified: Secondary | ICD-10-CM

## 2012-11-02 LAB — CBC WITH DIFFERENTIAL/PLATELET
Basophils Absolute: 0.1 10*3/uL (ref 0.0–0.1)
Eosinophils Absolute: 0.1 10*3/uL (ref 0.0–0.7)
HCT: 35.3 % — ABNORMAL LOW (ref 36.0–46.0)
Hemoglobin: 12 g/dL (ref 12.0–15.0)
Lymphs Abs: 1.4 10*3/uL (ref 0.7–4.0)
MCHC: 34 g/dL (ref 30.0–36.0)
MCV: 90.9 fl (ref 78.0–100.0)
Monocytes Absolute: 0.4 10*3/uL (ref 0.1–1.0)
Monocytes Relative: 8.5 % (ref 3.0–12.0)
Neutro Abs: 3.2 10*3/uL (ref 1.4–7.7)
Platelets: 208 10*3/uL (ref 150.0–400.0)
RDW: 13.8 % (ref 11.5–14.6)

## 2012-11-02 NOTE — Telephone Encounter (Signed)
Future orders CPX

## 2012-11-03 LAB — COMPREHENSIVE METABOLIC PANEL
ALT: 15 U/L (ref 0–35)
Alkaline Phosphatase: 66 U/L (ref 39–117)
CO2: 30 mEq/L (ref 19–32)
Creatinine, Ser: 0.9 mg/dL (ref 0.4–1.2)
GFR: 60.57 mL/min (ref >60.00)
Sodium: 135 mEq/L (ref 135–145)
Total Bilirubin: 0.7 mg/dL (ref 0.3–1.2)

## 2012-11-03 LAB — VITAMIN D 25 HYDROXY (VIT D DEFICIENCY, FRACTURES): Vit D, 25-Hydroxy: 27 ng/mL — ABNORMAL LOW (ref 30–89)

## 2012-11-03 LAB — LIPID PANEL
HDL: 58 mg/dL (ref >39.00)
Triglycerides: 67 mg/dL (ref 0.0–149.0)
VLDL: 13.4 mg/dL (ref 0.0–40.0)

## 2012-11-03 LAB — LDL CHOLESTEROL, DIRECT: Direct LDL: 138.1 mg/dL

## 2012-11-04 ENCOUNTER — Encounter: Payer: Self-pay | Admitting: *Deleted

## 2012-11-05 ENCOUNTER — Encounter: Payer: Self-pay | Admitting: Internal Medicine

## 2012-11-06 ENCOUNTER — Encounter: Payer: Self-pay | Admitting: Family Medicine

## 2012-11-06 ENCOUNTER — Ambulatory Visit (INDEPENDENT_AMBULATORY_CARE_PROVIDER_SITE_OTHER): Payer: Medicare Other | Admitting: Family Medicine

## 2012-11-06 VITALS — BP 140/80 | HR 92 | Temp 98.9°F | Ht 62.0 in | Wt 155.2 lb

## 2012-11-06 DIAGNOSIS — I10 Essential (primary) hypertension: Secondary | ICD-10-CM

## 2012-11-06 DIAGNOSIS — E559 Vitamin D deficiency, unspecified: Secondary | ICD-10-CM

## 2012-11-06 DIAGNOSIS — E7800 Pure hypercholesterolemia, unspecified: Secondary | ICD-10-CM

## 2012-11-06 DIAGNOSIS — M949 Disorder of cartilage, unspecified: Secondary | ICD-10-CM

## 2012-11-06 DIAGNOSIS — M899 Disorder of bone, unspecified: Secondary | ICD-10-CM

## 2012-11-06 NOTE — Assessment & Plan Note (Signed)
Pt declines further dexa One fall/ no fx -disc safety Will inc vit D intake to 2000 iu daily Has lost 2 in of ht

## 2012-11-06 NOTE — Progress Notes (Signed)
Subjective:    Patient ID: [name] [name], female    DOB: 01/11/1923, 79 y.o.   MRN: [id number]  HPI Here for check up of chronic medical conditions and to review health mt list   Has felt ok   bp is stable today  No cp or palpitations or headaches or edema  No side effects to medicines  BP Readings from Last 3 Encounters:  11/06/12 152/92  10/21/12 132/84  05/15/12 132/74     Wt is up 1 lb with bmi of 28 Lost 2 in of ht  Lipids Lab Results  Component Value Date   CHOL 205* 11/02/2012   CHOL 224* 08/12/2011   CHOL 232* 07/25/2010   Lab Results  Component Value Date   HDL 58.00 11/02/2012   HDL 29.52 08/12/2011   HDL 58.90 07/25/2010   Lab Results  Component Value Date   LDLCALC 127* 09/18/2007   Lab Results  Component Value Date   TRIG 67.0 11/02/2012   TRIG 71.0 08/12/2011   TRIG 87.0 07/25/2010   Lab Results  Component Value Date   CHOLHDL 4 11/02/2012   CHOLHDL 4 08/12/2011   CHOLHDL 4 07/25/2010   Lab Results  Component Value Date   LDLDIRECT 138.1 11/02/2012   LDLDIRECT 141.5 08/12/2011   LDLDIRECT 146.6 07/25/2010     Osteopenia dexa 12/10 She declines any other med for OP- she already is intol of fosamax  D level is 27-takes 1000 iu daily - she was supposed to be on more   Pap 2/13 ok Has had hysterectomy No gyn problems at all   mammo 2010 She is interested in keeping up with that  Self exam- no lumps or changes   Colon screen- last one long ago  Declines that   Zoster status-has not had the vaccine    Td 02- due for that, but she declines that today  Falls-one in the past year - just a bruise, none since   Mood - has been ok on zoloft-has not felt depressed   Review of Systems    Review of Systems  Constitutional: Negative for fever, appetite change, fatigue and unexpected weight change.  Eyes: Negative for pain and visual disturbance.  Respiratory: Negative for cough and shortness of breath.   Cardiovascular: Negative  for cp or palpitations    Gastrointestinal: Negative for nausea, diarrhea and constipation.  Genitourinary: Negative for urgency and frequency.  Skin: Negative for pallor or rash   Neurological: Negative for weakness, light-headedness, numbness and headaches.  Hematological: Negative for adenopathy. Does not bruise/bleed easily.  Psychiatric/Behavioral: Negative for dysphoric mood. The patient is not nervous/anxious.      Objective:   Physical Exam  Constitutional: She appears well-developed and well-nourished. No distress.  Well appearing elderly female  HENT:  Head: Normocephalic and atraumatic.  Eyes: Conjunctivae and EOM are normal. Pupils are equal, round, and reactive to light. Right eye exhibits no discharge. Left eye exhibits no discharge. No scleral icterus.  Neck: Normal range of motion. Neck supple. No JVD present. Carotid bruit is not present. No thyromegaly present.  Cardiovascular: Normal rate, regular rhythm, normal heart sounds and intact distal pulses.  Exam reveals no gallop.   Pulmonary/Chest: Effort normal and breath sounds normal. No respiratory distress. She has no wheezes. She exhibits no tenderness.  Abdominal: Soft. Bowel sounds are normal. She exhibits no distension, no abdominal bruit and no mass. There is no tenderness.  Genitourinary: No breast swelling, tenderness, discharge or bleeding.  Breast  exam: No mass, nodules, thickening, tenderness, bulging, retraction, inflamation, nipple discharge or skin changes noted.  No axillary or clavicular LA.  Chaperoned exam.    Musculoskeletal: She exhibits no edema.  Lymphadenopathy:    She has no cervical adenopathy.  Neurological: She is alert. She has normal reflexes. No cranial nerve deficit. She exhibits normal muscle tone. Coordination normal.  Skin: Skin is warm and dry. No rash noted. No erythema. No pallor.  Psychiatric: She has a normal mood and affect.          Assessment & Plan:

## 2012-11-06 NOTE — Assessment & Plan Note (Signed)
bp in fair control at this time  No changes needed  Disc lifstyle change with low sodium diet and exercise  Labs reviewed  

## 2012-11-06 NOTE — Assessment & Plan Note (Signed)
Level 27 Will inc to 2000 iu daily

## 2012-11-06 NOTE — Assessment & Plan Note (Signed)
Disc goals for lipids and reasons to control them Rev labs with pt Rev low sat fat diet in detail   

## 2012-11-06 NOTE — Patient Instructions (Addendum)
Increase your vitamin D to 2000 iu over the counter once daily and get some outdoor time If you are interested in a shingles/zoster vaccine - call your insurance to check on coverage,( you should not get it within 1 month of other vaccines) , then call us for a prescription  for it to take to a pharmacy that gives the shot , or make a nurse visit to get it here depending on your coverage You can make your own mammogram appt- I gave you the phone number on that sheet of paper

## 2012-12-10 ENCOUNTER — Encounter: Payer: Self-pay | Admitting: Family Medicine

## 2012-12-10 ENCOUNTER — Ambulatory Visit (INDEPENDENT_AMBULATORY_CARE_PROVIDER_SITE_OTHER): Payer: Medicare Other | Admitting: Family Medicine

## 2012-12-10 VITALS — BP 138/88 | HR 82 | Temp 98.3°F | Wt 154.0 lb

## 2012-12-10 DIAGNOSIS — R059 Cough, unspecified: Secondary | ICD-10-CM

## 2012-12-10 DIAGNOSIS — R05 Cough: Secondary | ICD-10-CM

## 2012-12-10 MED ORDER — LEVOFLOXACIN 500 MG PO TABS: 500.0000 mg | ORAL_TABLET | Freq: Every day | ORAL | Status: DC

## 2012-12-10 MED ORDER — BENZONATATE 100 MG PO CAPS: 100.0000 mg | ORAL_CAPSULE | Freq: Three times a day (TID) | ORAL | Status: DC | PRN

## 2012-12-10 NOTE — Patient Instructions (Addendum)
Please start Allegra (this is over the counter). Take antibiotic (levaquin) as directed- 1 tablet daily for 7 days. Take Tessalon perles as needed for cough.

## 2012-12-10 NOTE — Progress Notes (Signed)
Subjective:    Patient ID: [name] [name], female    DOB: 09-10-1922, 79 y.o.   MRN: [id number]  HPI Very pleasant 79 yo pt of Dr. Milinda Antis here for 2 weeks of progressive cough. Seasonal allergies have been bad this year, does not like taking anything for it.  Started with runny nose, sore throat.  Sore throat is better but itchy eyes and runny nose persistent. Also has productive cough now.  She thinks she is wheezing.  Coughing spells are terrible at night.  No CP or SOB.     Patient Active Problem List  Diagnosis  . UNSPECIFIED VITAMIN D DEFICIENCY  . HYPERCHOLESTEROLEMIA  . ANXIETY  . DEPRESSION  . HYPERTENSION  . PERICARDIAL EFFUSION  . PREMATURE VENTRICULAR CONTRACTIONS  . BRADYCARDIA  . HEMORRHOIDS  . VENOUS INSUFFICIENCY  . ALLERGIC RHINITIS  . GERD  . OSTEOARTHRITIS  . GANGLION CYST  . OSTEOPENIA  . INTERMITTENT VERTIGO  . MURMUR  . PPM-Medtronic  . Atrial fibrillation  . Gynecological examination  . Hip pain, left  . Knee pain, left  . Forgetfulness  . Hypotension, unspecified  . Ecchymosis  . Cough   Past Medical History  Diagnosis Date  . Allergic rhinitis   . Anxiety   . Depression   . GERD (gastroesophageal reflux disease)   . Hypertension   . Osteopenia   . Bradycardia   . Hyperlipidemia   . Duodenal ulcer   . Other specified cardiac dysrhythmias    Past Surgical History  Procedure Laterality Date  . Appendectomy    . Total abdominal hysterectomy    . Tonsillectomy    . Ankle fracture surgery  05/2002  . Insert / replace / remove pacemaker     History  Substance Use Topics  . Smoking status: Never Smoker   . Smokeless tobacco: Never Used  . Alcohol Use: Yes     Comment: infrequently   Family History  Problem Relation Age of Onset  . Heart attack Father   . Depression Mother   . Alzheimer's disease Mother   . Pneumonia Mother   . Heart disease Brother     hx of CABG   Allergies  Allergen Reactions  . Ace Inhibitors      REACTION: cough  . Alendronate Sodium     REACTION: aches  . Dabigatran Etexilate Mesylate Diarrhea    indigestion  . Nitrofurantoin     REACTION: rash  . Penicillins     REACTION: rash  . Prednisone Other (See Comments)    Extreme weakness   Current Outpatient Prescriptions on File Prior to Visit  Medication Sig Dispense Refill  . apixaban (ELIQUIS) 5 MG TABS tablet Take 1 tablet (5 mg total) by mouth 2 (two) times daily.  180 tablet  1  . cetirizine (ZYRTEC) 10 MG tablet Take 10 mg by mouth daily.        . cholecalciferol (VITAMIN D) 1000 UNITS tablet Take 2,000 Units by mouth daily.       . hydrochlorothiazide (HYDRODIURIL) 25 MG tablet Take 1 tablet (25 mg total) by mouth daily.  90 tablet  1  . irbesartan (AVAPRO) 150 MG tablet Take 1 tablet (150 mg total) by mouth at bedtime.  90 tablet  1  . metoprolol tartrate (LOPRESSOR) 25 MG tablet Take 1 tablet (25 mg total) by mouth 2 (two) times daily.  180 tablet  1  . Multiple Vitamin (MULTIVITAMIN) tablet Take 1 tablet by mouth daily.        Marland Kitchen  sertraline (ZOLOFT) 100 MG tablet Take 1 1/2 pills by mouth once daily  135 tablet  1   No current facility-administered medications on file prior to visit.       Review of Systems See HPI No n/v/d No dizziness       Objective:   Physical Exam  BP 138/88  Pulse 82  Temp(Src) 98.3 F (36.8 C)  Wt 154 lb (69.854 kg)  BMI 28.16 kg/m2  SpO2 95%  Constitutional: She appears well-developed and well-nourished. No distress.  HENT:  Head: Normocephalic and atraumatic.  Right Ear: External ear normal.  Left Ear: External ear normal.  Mouth/Throat: Oropharynx is clear and moist.       Nares are injected and congested  No sinus tenderness , clear rhinorrhea Eyes: Conjunctivae and EOM are normal. Pupils are equal, round, and reactive to light. Right eye exhibits no discharge. Left eye exhibits no discharge.  Neck: Normal range of motion. Neck supple. No JVD present. No thyromegaly  present.  Cardiovascular: Normal rate, regular rhythm and normal heart sounds.   Pulmonary/Chest: Effort normal. No respiratory distress. She has wheezes. She has no rales. She exhibits no tenderness.       Harsh bs at bases No rales or crackles Scattered exp wheezes Musculoskeletal: She exhibits no edema.  Lymphadenopathy:    She has no cervical adenopathy.  Neurological: She is alert. She has normal reflexes. No cranial nerve deficit.  Skin: Skin is warm and dry. No rash noted. No erythema. No pallor.  Psychiatric: She has a normal mood and affect.       Assessment & Plan:  1. Cough Deteriorated with wheezes on exam. I suspect this started due to seasonal allergies/allergic rhinitis. Will treat for bronchitis with levaquin (PCN allergic), she has not tolerated inhalers well in past and would prefer not to have it. Advised starting allegra. Tessalon perles for cough. Call or return to clinic prn if these symptoms worsen or fail to improve as anticipated. The patient indicates understanding of these issues and agrees with the plan.

## 2012-12-21 ENCOUNTER — Ambulatory Visit (INDEPENDENT_AMBULATORY_CARE_PROVIDER_SITE_OTHER): Payer: Medicare Other | Admitting: Family Medicine

## 2012-12-21 ENCOUNTER — Encounter: Payer: Self-pay | Admitting: Family Medicine

## 2012-12-21 VITALS — BP 142/82 | HR 88 | Temp 99.1°F | Ht 62.0 in | Wt 154.5 lb

## 2012-12-21 DIAGNOSIS — J209 Acute bronchitis, unspecified: Secondary | ICD-10-CM | POA: Insufficient documentation

## 2012-12-21 MED ORDER — BENZONATATE 100 MG PO CAPS: 100.0000 mg | ORAL_CAPSULE | Freq: Three times a day (TID) | ORAL | Status: DC | PRN

## 2012-12-21 NOTE — Progress Notes (Signed)
Subjective:    Patient ID: [name] [name], female    DOB: June 15, 1923, 79 y.o.   MRN: [id number]  HPI Here for bronchitis- saw Dr Darrick Meigs with levaquin and allegra , also tessalon for cough  Since then - cough is improved but not gone  Has not noticed any wheezing or sob  Cough is non productive   Tickle in nose and throat - and then sneezing (allergies)  Sore throat persists-not nearly as bad, she can swallow ok   Temp 99.1- low grade (no fever at home , but gets cold and warm) No sinus pain   Appetite is fine   Patient Active Problem List   Diagnosis Date Noted  . Cough 12/10/2012  . Hypotension, unspecified 10/21/2012  . Ecchymosis 10/21/2012  . Forgetfulness 05/15/2012  . Gynecological examination 09/27/2011  . Hip pain, left 09/27/2011  . Knee pain, left 09/27/2011  . Atrial fibrillation 06/06/2011  . PREMATURE VENTRICULAR CONTRACTIONS 12/25/2009  . PPM-Medtronic 12/25/2009  . INTERMITTENT VERTIGO 08/31/2009  . UNSPECIFIED VITAMIN D DEFICIENCY 10/03/2008  . OSTEOARTHRITIS 10/03/2008  . GANGLION CYST 10/03/2008  . HYPERCHOLESTEROLEMIA 06/16/2007  . ANXIETY 06/16/2007  . DEPRESSION 06/16/2007  . HYPERTENSION 06/16/2007  . PERICARDIAL EFFUSION 06/16/2007  . BRADYCARDIA 06/16/2007  . HEMORRHOIDS 06/16/2007  . VENOUS INSUFFICIENCY 06/16/2007  . ALLERGIC RHINITIS 06/16/2007  . GERD 06/16/2007  . OSTEOPENIA 06/16/2007  . MURMUR 06/16/2007   Past Medical History  Diagnosis Date  . Allergic rhinitis   . Anxiety   . Depression   . GERD (gastroesophageal reflux disease)   . Hypertension   . Osteopenia   . Bradycardia   . Hyperlipidemia   . Duodenal ulcer   . Other specified cardiac dysrhythmias    Past Surgical History  Procedure Laterality Date  . Appendectomy    . Total abdominal hysterectomy    . Tonsillectomy    . Ankle fracture surgery  05/2002  . Insert / replace / remove pacemaker     History  Substance Use Topics  . Smoking status: Never  Smoker   . Smokeless tobacco: Never Used  . Alcohol Use: Yes     Comment: infrequently   Family History  Problem Relation Age of Onset  . Heart attack Father   . Depression Mother   . Alzheimer's disease Mother   . Pneumonia Mother   . Heart disease Brother     hx of CABG   Allergies  Allergen Reactions  . Ace Inhibitors     REACTION: cough  . Alendronate Sodium     REACTION: aches  . Dabigatran Etexilate Mesylate Diarrhea    indigestion  . Nitrofurantoin     REACTION: rash  . Penicillins     REACTION: rash  . Prednisone Other (See Comments)    Extreme weakness   Current Outpatient Prescriptions on File Prior to Visit  Medication Sig Dispense Refill  . apixaban (ELIQUIS) 5 MG TABS tablet Take 1 tablet (5 mg total) by mouth 2 (two) times daily.  180 tablet  1  . cetirizine (ZYRTEC) 10 MG tablet Take 10 mg by mouth daily.        . cholecalciferol (VITAMIN D) 1000 UNITS tablet Take 2,000 Units by mouth daily.       . hydrochlorothiazide (HYDRODIURIL) 25 MG tablet Take 1 tablet (25 mg total) by mouth daily.  90 tablet  1  . irbesartan (AVAPRO) 150 MG tablet Take 1 tablet (150 mg total) by mouth at bedtime.  90 tablet  1  . metoprolol tartrate (LOPRESSOR) 25 MG tablet Take 1 tablet (25 mg total) by mouth 2 (two) times daily.  180 tablet  1  . Multiple Vitamin (MULTIVITAMIN) tablet Take 1 tablet by mouth daily.        . sertraline (ZOLOFT) 100 MG tablet Take 1 1/2 pills by mouth once daily  135 tablet  1   No current facility-administered medications on file prior to visit.      Review of Systems Review of Systems  Constitutional: Negative for fever, appetite change,  and unexpected weight change ENt neg for ear pain or ST/ pos for rhinorrhea and sneezing .  Eyes: Negative for pain and visual disturbance.  Respiratory: Negative for wheeze  and shortness of breath.  pos for cough Cardiovascular: Negative for cp or palpitations    Gastrointestinal: Negative for nausea,  diarrhea and constipation.  Genitourinary: Negative for urgency and frequency.  Skin: Negative for pallor or rash   Neurological: Negative for weakness, light-headedness, numbness and headaches.  Hematological: Negative for adenopathy. Does not bruise/bleed easily.  Psychiatric/Behavioral: Negative for dysphoric mood. The patient is not nervous/anxious.         Objective:   Physical Exam  Constitutional: She appears well-developed and well-nourished. No distress.  HENT:  Head: Normocephalic and atraumatic.  Right Ear: External ear normal.  Left Ear: External ear normal.  Mouth/Throat: Oropharynx is clear and moist. No oropharyngeal exudate.  Nares are injected and congested    Eyes: Conjunctivae and EOM are normal. Pupils are equal, round, and reactive to light. Right eye exhibits no discharge. Left eye exhibits no discharge.  Neck: Normal range of motion. Neck supple.  Cardiovascular: Normal rate and regular rhythm.   Pulmonary/Chest: Effort normal and breath sounds normal. No respiratory distress. She has no wheezes. She has no rales. She exhibits no tenderness.  Harsh hacking cough bs are clear- good air exch, no wheeze   Lymphadenopathy:    She has no cervical adenopathy.  Neurological: She is alert.  Skin: Skin is warm and dry. No rash noted. No pallor.  Psychiatric: She has a normal mood and affect.          Assessment & Plan:

## 2012-12-21 NOTE — Assessment & Plan Note (Signed)
Improved significantly but with persistent irritative cough/ dry  Px tessalon pearles-which helped previously without side eff Adv if worse/ fever/ or inc prod cough to call or f/u

## 2012-12-21 NOTE — Patient Instructions (Addendum)
I'm glad you feel somewhat better -it is common for cough to persist after bronchitis (and the pollen allergy season are contributing also)  Take the tessalon pills for cough as directed - swallow them whole If worse or if wheezing returns , or productive cough or no further improvement please let me know  Make sure to drink enough fluids

## 2013-01-04 ENCOUNTER — Other Ambulatory Visit: Payer: Self-pay

## 2013-01-04 MED ORDER — BENZONATATE 100 MG PO CAPS: 100.0000 mg | ORAL_CAPSULE | Freq: Three times a day (TID) | ORAL | Status: DC | PRN

## 2013-01-04 NOTE — Telephone Encounter (Signed)
Please give her a refil-thanks

## 2013-01-04 NOTE — Telephone Encounter (Signed)
Pt request refill tessalon to [facility]; pt said she had almost stopped coughing and since out of med still has non productive cough. No fever, slight wheezing but no SOB.Please advise.

## 2013-01-07 ENCOUNTER — Ambulatory Visit (INDEPENDENT_AMBULATORY_CARE_PROVIDER_SITE_OTHER): Payer: Medicare Other | Admitting: Internal Medicine

## 2013-01-07 ENCOUNTER — Encounter: Payer: Self-pay | Admitting: Internal Medicine

## 2013-01-07 VITALS — BP 148/100 | HR 78 | Ht 64.0 in | Wt 134.8 lb

## 2013-01-07 DIAGNOSIS — I4891 Unspecified atrial fibrillation: Secondary | ICD-10-CM

## 2013-01-07 DIAGNOSIS — I10 Essential (primary) hypertension: Secondary | ICD-10-CM

## 2013-01-07 DIAGNOSIS — Z950 Presence of cardiac pacemaker: Secondary | ICD-10-CM

## 2013-01-07 LAB — PACEMAKER DEVICE OBSERVATION
AL THRESHOLD: 0.75 V
ATRIAL PACING PM: 99
BAMS-0001: 175 {beats}/min
RV LEAD THRESHOLD: 1 V

## 2013-01-07 NOTE — Patient Instructions (Addendum)
Your physician wants you to follow-up in: 8/18 for [facility] transmission and 1 year with Dr. [name]. You will receive a reminder letter in the mail two months in advance. If you don't receive a letter, please call our office to schedule the follow-up appointment.

## 2013-01-07 NOTE — Progress Notes (Signed)
HPI [name] [name] returns today for followup. She is a very pleasant 79 year old woman with a history of atrial fibrillation and bradycardia, status post permanent pacemaker insertion. When I saw her last, she was having trouble taking anticoagulation medications. She had been intolerant of warfarin as well as Xarelto. She was started on Eliquis. Since then she has done well. She denies chest pain or shortness of breath. No syncope. No peripheral edema. At age 73, she wishes she had more energy. She has recovered from bronchitis over the last month. No residual fever or chills. Allergies  Allergen Reactions  . Ace Inhibitors     REACTION: cough  . Alendronate Sodium     REACTION: aches  . Dabigatran Etexilate Mesylate Diarrhea    indigestion  . Nitrofurantoin     REACTION: rash  . Penicillins     REACTION: rash  . Prednisone Other (See Comments)    Extreme weakness     Current Outpatient Prescriptions  Medication Sig Dispense Refill  . apixaban (ELIQUIS) 5 MG TABS tablet Take 1 tablet (5 mg total) by mouth 2 (two) times daily.  180 tablet  1  . benzonatate (TESSALON) 100 MG capsule Take 1 capsule (100 mg total) by mouth 3 (three) times daily as needed for cough.  30 capsule  0  . cetirizine (ZYRTEC) 10 MG tablet Take 10 mg by mouth daily.        . cholecalciferol (VITAMIN D) 1000 UNITS tablet Take 2,000 Units by mouth daily.       . hydrochlorothiazide (HYDRODIURIL) 25 MG tablet Take 1 tablet (25 mg total) by mouth daily.  90 tablet  1  . irbesartan (AVAPRO) 150 MG tablet Take 1 tablet (150 mg total) by mouth at bedtime.  90 tablet  1  . metoprolol tartrate (LOPRESSOR) 25 MG tablet Take 1 tablet (25 mg total) by mouth 2 (two) times daily.  180 tablet  1  . Multiple Vitamin (MULTIVITAMIN) tablet Take 1 tablet by mouth daily.        . sertraline (ZOLOFT) 100 MG tablet Take 1 1/2 pills by mouth once daily  135 tablet  1   No current facility-administered medications for this visit.      Past Medical History  Diagnosis Date  . Allergic rhinitis   . Anxiety   . Depression   . GERD (gastroesophageal reflux disease)   . Hypertension   . Osteopenia   . Bradycardia   . Hyperlipidemia   . Duodenal ulcer   . Other specified cardiac dysrhythmias     ROS:   All systems reviewed and negative except as noted in the HPI.   Past Surgical History  Procedure Laterality Date  . Appendectomy    . Total abdominal hysterectomy    . Tonsillectomy    . Ankle fracture surgery  05/2002  . Insert / replace / remove pacemaker       Family History  Problem Relation Age of Onset  . Heart attack Father   . Depression Mother   . Alzheimer's disease Mother   . Pneumonia Mother   . Heart disease Brother     hx of CABG     History   Social History  . Marital Status: Married    Spouse Name: N/A    Number of Children: N/A  . Years of Education: N/A   Occupational History  . Not on file.   Social History Main Topics  . Smoking status: Never Smoker   . Smokeless  tobacco: Never Used  . Alcohol Use: Yes     Comment: infrequently  . Drug Use: No  . Sexually Active:    Other Topics Concern  . Not on file   Social History Narrative  . No narrative on file     BP 148/100  Pulse 78  Ht 5\' 4"  (1.626 m)  Wt 134 lb 12.8 oz (61.145 kg)  BMI 23.13 kg/m2  Physical Exam:  Well appearing elderly woman,NAD HEENT: Unremarkable Neck:  7 cm JVD, no thyromegally Back:  No CVA tenderness Lungs:  Clear with no wheezes, rales, or rhonchi. HEART:  Regular rate rhythm, no murmurs, no rubs, no clicks Abd:  soft, positive bowel sounds, no organomegally, no rebound, no guarding Ext:  2 plus pulses, no edema, no cyanosis, no clubbing Skin:  No rashes no nodules Neuro:  CN II through XII intact, motor grossly intact  EKG - normal sinus rhythm with atrial pacing  DEVICE  Normal device function.  See PaceArt for details.   Assess/Plan:

## 2013-01-07 NOTE — Assessment & Plan Note (Signed)
At home, she states that her blood pressure is normal. She claims to have whitecoat hypertension. She will continue her current medical therapy. I've asked the patient to maintain a low-sodium diet, and to increase her physical activity.

## 2013-01-07 NOTE — Assessment & Plan Note (Signed)
She is currently in sinus rhythm. She is tolerating her anticoagulation. We'll plan to see her back in several months.

## 2013-01-07 NOTE — Assessment & Plan Note (Signed)
Her Medtronic dual-chamber pacemaker is working normally. We'll plan to recheck in several months. 

## 2013-01-26 ENCOUNTER — Encounter: Payer: Self-pay | Admitting: Family Medicine

## 2013-01-26 ENCOUNTER — Ambulatory Visit (INDEPENDENT_AMBULATORY_CARE_PROVIDER_SITE_OTHER): Payer: Medicare Other | Admitting: Family Medicine

## 2013-01-26 VITALS — BP 142/96 | HR 84 | Temp 99.0°F | Ht 62.0 in | Wt 155.0 lb

## 2013-01-26 DIAGNOSIS — J069 Acute upper respiratory infection, unspecified: Secondary | ICD-10-CM | POA: Insufficient documentation

## 2013-01-26 MED ORDER — GUAIFENESIN-CODEINE 100-10 MG/5ML PO SYRP: 5.0000 mL | ORAL_SOLUTION | Freq: Four times a day (QID) | ORAL | Status: DC | PRN

## 2013-01-26 NOTE — Assessment & Plan Note (Signed)
With reassuring exam Disc symptomatic care - see instructions on AVS  Update if not starting to improve in a week or if worsening   Given robitussin ac for cough to use with caution  Rest/ fluids adv Handout on uri given also

## 2013-01-26 NOTE — Progress Notes (Signed)
Subjective:    Patient ID: [name] [name], female    DOB: 1922-11-16, 79 y.o.   MRN: [id number]  HPI Here with uri/ bronchial symptoms Had it back in April and finally got better   Was better for 1-2 weeks   Cough/ sneeze/ hoarse/ st  Symptoms started on Saturday  No fever , no headache , no tick bites  Nasal discharge is clear  Cough is productive of thick white phlegm- scant  A little wheezing  Over the counter is taking allegra   Does not feel like this is allergies  ? If this could be a cold   Patient Active Problem List   Diagnosis Date Noted  . Viral URI with cough 01/26/2013  . Cough 12/10/2012  . Hypotension, unspecified 10/21/2012  . Ecchymosis 10/21/2012  . Forgetfulness 05/15/2012  . Gynecological examination 09/27/2011  . Hip pain, left 09/27/2011  . Knee pain, left 09/27/2011  . Atrial fibrillation 06/06/2011  . PREMATURE VENTRICULAR CONTRACTIONS 12/25/2009  . PPM-Medtronic 12/25/2009  . INTERMITTENT VERTIGO 08/31/2009  . UNSPECIFIED VITAMIN D DEFICIENCY 10/03/2008  . OSTEOARTHRITIS 10/03/2008  . GANGLION CYST 10/03/2008  . HYPERCHOLESTEROLEMIA 06/16/2007  . ANXIETY 06/16/2007  . DEPRESSION 06/16/2007  . HYPERTENSION 06/16/2007  . PERICARDIAL EFFUSION 06/16/2007  . BRADYCARDIA 06/16/2007  . HEMORRHOIDS 06/16/2007  . VENOUS INSUFFICIENCY 06/16/2007  . ALLERGIC RHINITIS 06/16/2007  . GERD 06/16/2007  . OSTEOPENIA 06/16/2007  . MURMUR 06/16/2007   Past Medical History  Diagnosis Date  . Allergic rhinitis   . Anxiety   . Depression   . GERD (gastroesophageal reflux disease)   . Hypertension   . Osteopenia   . Bradycardia   . Hyperlipidemia   . Duodenal ulcer   . Other specified cardiac dysrhythmias(427.89)    Past Surgical History  Procedure Laterality Date  . Appendectomy    . Total abdominal hysterectomy    . Tonsillectomy    . Ankle fracture surgery  05/2002  . Insert / replace / remove pacemaker     History  Substance Use  Topics  . Smoking status: Never Smoker   . Smokeless tobacco: Never Used  . Alcohol Use: Yes     Comment: infrequently-rare   Family History  Problem Relation Age of Onset  . Heart attack Father   . Depression Mother   . Alzheimer's disease Mother   . Pneumonia Mother   . Heart disease Brother     hx of CABG   Allergies  Allergen Reactions  . Ace Inhibitors     REACTION: cough  . Alendronate Sodium     REACTION: aches  . Dabigatran Etexilate Mesylate Diarrhea    indigestion  . Nitrofurantoin     REACTION: rash  . Penicillins     REACTION: rash  . Prednisone Other (See Comments)    Extreme weakness   Current Outpatient Prescriptions on File Prior to Visit  Medication Sig Dispense Refill  . apixaban (ELIQUIS) 5 MG TABS tablet Take 1 tablet (5 mg total) by mouth 2 (two) times daily.  180 tablet  1  . cholecalciferol (VITAMIN D) 1000 UNITS tablet Take 2,000 Units by mouth daily.       . hydrochlorothiazide (HYDRODIURIL) 25 MG tablet Take 1 tablet (25 mg total) by mouth daily.  90 tablet  1  . irbesartan (AVAPRO) 150 MG tablet Take 1 tablet (150 mg total) by mouth at bedtime.  90 tablet  1  . metoprolol tartrate (LOPRESSOR) 25 MG tablet Take 1 tablet (25  mg total) by mouth 2 (two) times daily.  180 tablet  1  . Multiple Vitamin (MULTIVITAMIN) tablet Take 1 tablet by mouth daily.         No current facility-administered medications on file prior to visit.    Review of Systems Review of Systems  Constitutional: Negative for fever, appetite change, and unexpected weight change.  ENt pos for congestion/ post nasal drip and ST, neg for ear pain or sinus pain  Eyes: Negative for pain and visual disturbance.  Respiratory: Negative for sob   Cardiovascular: Negative for cp or palpitations    Gastrointestinal: Negative for nausea, diarrhea and constipation.  Genitourinary: Negative for urgency and frequency.  Skin: Negative for pallor or rash   Neurological: Negative for  weakness, light-headedness, numbness and headaches.  Hematological: Negative for adenopathy. Does not bruise/bleed easily.  Psychiatric/Behavioral: Negative for dysphoric mood. The patient is not nervous/anxious.         Objective:   Physical Exam  Constitutional: She appears well-developed and well-nourished. No distress.  HENT:  Head: Normocephalic and atraumatic.  Right Ear: External ear normal.  Left Ear: External ear normal.  Mouth/Throat: Oropharynx is clear and moist. No oropharyngeal exudate.  Nares are injected and congested  No sinus tenderness  Eyes: Conjunctivae and EOM are normal. Pupils are equal, round, and reactive to light. Right eye exhibits no discharge. Left eye exhibits no discharge.  Neck: Normal range of motion. Neck supple.  Cardiovascular: Normal rate.   Pulmonary/Chest: Effort normal and breath sounds normal. No respiratory distress. She has no wheezes. She has no rales. She exhibits no tenderness.  Lymphadenopathy:    She has no cervical adenopathy.  Neurological: She is alert.  Skin: Skin is warm and dry. No rash noted. No erythema. No pallor.  Psychiatric: She has a normal mood and affect.          Assessment & Plan:

## 2013-01-26 NOTE — Patient Instructions (Addendum)
For head and chest cold  Continue allegra if it helps with runny nose and sneezing and drip  Try the robitussin codeine cough medicine  Nasal saline spray is good for congestion Rest and fluids Update if not starting to improve in a week or if worsening

## 2013-02-08 ENCOUNTER — Encounter: Payer: Self-pay | Admitting: Family Medicine

## 2013-02-08 ENCOUNTER — Ambulatory Visit (INDEPENDENT_AMBULATORY_CARE_PROVIDER_SITE_OTHER): Payer: Medicare Other | Admitting: Family Medicine

## 2013-02-08 VITALS — BP 150/92 | HR 87 | Temp 98.4°F | Ht 62.0 in | Wt 154.5 lb

## 2013-02-08 DIAGNOSIS — I959 Hypotension, unspecified: Secondary | ICD-10-CM

## 2013-02-08 DIAGNOSIS — I10 Essential (primary) hypertension: Secondary | ICD-10-CM

## 2013-02-08 MED ORDER — VALSARTAN 160 MG PO TABS: 160.0000 mg | ORAL_TABLET | Freq: Every day | ORAL | Status: DC

## 2013-02-08 NOTE — Assessment & Plan Note (Signed)
Pt has white coat syndrome in the office - but lower bp at home Trial of avapro 150- caused hypotension- bp went to normal after stopping it  She would rather pay out of pocket for diovan

## 2013-02-08 NOTE — Progress Notes (Signed)
Subjective:    Patient ID: [name] [name], female    DOB: 1923/07/09, 79 y.o.   MRN: [id number]  HPI Pt here with concerns of blood pressure instability She had to change to avapro from diovan   She felt weird after taking it wed night - woke up feeling dizzy and disoriented  Then she took her bp- was low for her 97/62 and then 111/68 then 99/59 then as she came out of is 116/78 and 149/94  Had been on the diovan for years -no problems   She would pay out of pocket  She does not want to try a 1/2 pill of avapro  She does not want to try another arb  bp up today off arb- but also has whitecoat syndrome Re check still high  She is certain her bp cuff is accurate   Patient Active Problem List   Diagnosis Date Noted  . Cough 12/10/2012  . Hypotension, unspecified 10/21/2012  . Ecchymosis 10/21/2012  . Forgetfulness 05/15/2012  . Gynecological examination 09/27/2011  . Hip pain, left 09/27/2011  . Knee pain, left 09/27/2011  . Atrial fibrillation 06/06/2011  . PREMATURE VENTRICULAR CONTRACTIONS 12/25/2009  . PPM-Medtronic 12/25/2009  . INTERMITTENT VERTIGO 08/31/2009  . UNSPECIFIED VITAMIN D DEFICIENCY 10/03/2008  . OSTEOARTHRITIS 10/03/2008  . GANGLION CYST 10/03/2008  . HYPERCHOLESTEROLEMIA 06/16/2007  . ANXIETY 06/16/2007  . DEPRESSION 06/16/2007  . HYPERTENSION 06/16/2007  . PERICARDIAL EFFUSION 06/16/2007  . BRADYCARDIA 06/16/2007  . HEMORRHOIDS 06/16/2007  . VENOUS INSUFFICIENCY 06/16/2007  . ALLERGIC RHINITIS 06/16/2007  . GERD 06/16/2007  . OSTEOPENIA 06/16/2007  . MURMUR 06/16/2007   Past Medical History  Diagnosis Date  . Allergic rhinitis   . Anxiety   . Depression   . GERD (gastroesophageal reflux disease)   . Hypertension   . Osteopenia   . Bradycardia   . Hyperlipidemia   . Duodenal ulcer   . Other specified cardiac dysrhythmias(427.89)    Past Surgical History  Procedure Laterality Date  . Appendectomy    . Total abdominal hysterectomy     . Tonsillectomy    . Ankle fracture surgery  05/2002  . Insert / replace / remove pacemaker     History  Substance Use Topics  . Smoking status: Never Smoker   . Smokeless tobacco: Never Used  . Alcohol Use: Yes     Comment: infrequently-rare   Family History  Problem Relation Age of Onset  . Heart attack Father   . Depression Mother   . Alzheimer's disease Mother   . Pneumonia Mother   . Heart disease Brother     hx of CABG   Allergies  Allergen Reactions  . Ace Inhibitors     REACTION: cough  . Alendronate Sodium     REACTION: aches  . Avapro (Irbesartan)     hypotension  . Dabigatran Etexilate Mesylate Diarrhea    indigestion  . Nitrofurantoin     REACTION: rash  . Penicillins     REACTION: rash  . Prednisone Other (See Comments)    Extreme weakness   Current Outpatient Prescriptions on File Prior to Visit  Medication Sig Dispense Refill  . apixaban (ELIQUIS) 5 MG TABS tablet Take 1 tablet (5 mg total) by mouth 2 (two) times daily.  180 tablet  1  . cholecalciferol (VITAMIN D) 1000 UNITS tablet Take 2,000 Units by mouth daily.       Marland Kitchen guaiFENesin-codeine (ROBITUSSIN AC) 100-10 MG/5ML syrup Take 5 mLs by mouth 4 (four)  times daily as needed for cough (watch out for sedation).  120 mL  0  . hydrochlorothiazide (HYDRODIURIL) 25 MG tablet Take 1 tablet (25 mg total) by mouth daily.  90 tablet  1  . metoprolol tartrate (LOPRESSOR) 25 MG tablet Take 1 tablet (25 mg total) by mouth 2 (two) times daily.  180 tablet  1  . Multiple Vitamin (MULTIVITAMIN) tablet Take 1 tablet by mouth daily.        . sertraline (ZOLOFT) 100 MG tablet Take 100 mg by mouth daily.       No current facility-administered medications on file prior to visit.     Review of Systems Review of Systems  Constitutional: Negative for fever, appetite change, fatigue and unexpected weight change.  Eyes: Negative for pain and visual disturbance.  Respiratory: Negative for cough and shortness of  breath.   Cardiovascular: Negative for cp or palpitations    Gastrointestinal: Negative for nausea, diarrhea and constipation.  Genitourinary: Negative for urgency and frequency.  Skin: Negative for pallor or rash   Neurological: Negative for weakness, light-headedness, numbness and headaches. (was weak and dizzy but this has resolved) Hematological: Negative for adenopathy. Does not bruise/bleed easily.  Psychiatric/Behavioral: Negative for dysphoric mood. The patient is not nervous/anxious.         Objective:   Physical Exam  Constitutional: She appears well-developed and well-nourished. No distress.  HENT:  Head: Normocephalic and atraumatic.  Mouth/Throat: Oropharynx is clear and moist.  Eyes: Conjunctivae and EOM are normal. Pupils are equal, round, and reactive to light. No scleral icterus.  Neck: Normal range of motion. Neck supple. No JVD present. Carotid bruit is not present. No thyromegaly present.  Cardiovascular: Normal rate, regular rhythm, normal heart sounds and intact distal pulses.  Exam reveals no gallop.   Pulmonary/Chest: Effort normal and breath sounds normal.  No crackles  Abdominal: She exhibits no abdominal bruit.  Lymphadenopathy:    She has no cervical adenopathy.  Neurological: She is alert. She has normal reflexes. Coordination normal.  Skin: Skin is warm and dry. No rash noted. No pallor.  Psychiatric: She has a normal mood and affect.          Assessment & Plan:

## 2013-02-08 NOTE — Assessment & Plan Note (Signed)
Pt was intol of avapro 150- low bp/ dizziness- she wants to pay out of pocket for diovan 160  She does not want to try another arb or a lower dose Will continue on diovan and update if any problems

## 2013-02-08 NOTE — Patient Instructions (Addendum)
Stay off avapro and go back on diovan I printed px - so call around to see who is cheapest from a pharmacy  Take care of yourself

## 2013-03-24 ENCOUNTER — Other Ambulatory Visit: Payer: Self-pay

## 2013-03-24 DIAGNOSIS — I4891 Unspecified atrial fibrillation: Secondary | ICD-10-CM

## 2013-03-24 MED ORDER — SERTRALINE HCL 100 MG PO TABS: 100.0000 mg | ORAL_TABLET | Freq: Every day | ORAL | Status: DC

## 2013-03-24 MED ORDER — HYDROCHLOROTHIAZIDE 25 MG PO TABS: 25.0000 mg | ORAL_TABLET | Freq: Every day | ORAL | Status: DC

## 2013-03-24 MED ORDER — APIXABAN 5 MG PO TABS: 5.0000 mg | ORAL_TABLET | Freq: Two times a day (BID) | ORAL | Status: DC

## 2013-03-24 MED ORDER — METOPROLOL TARTRATE 25 MG PO TABS: 25.0000 mg | ORAL_TABLET | Freq: Two times a day (BID) | ORAL | Status: DC

## 2013-03-24 NOTE — Telephone Encounter (Signed)
Pt request refill to [facility] for eliquis,HCTZ,metoprolol and sertraline. Advised done.

## 2013-04-12 ENCOUNTER — Encounter: Payer: Medicare Other | Admitting: *Deleted

## 2013-04-22 ENCOUNTER — Encounter: Payer: Self-pay | Admitting: *Deleted

## 2013-04-28 ENCOUNTER — Ambulatory Visit (INDEPENDENT_AMBULATORY_CARE_PROVIDER_SITE_OTHER): Payer: Medicare Other | Admitting: *Deleted

## 2013-04-28 ENCOUNTER — Other Ambulatory Visit: Payer: Self-pay | Admitting: Internal Medicine

## 2013-04-28 DIAGNOSIS — I498 Other specified cardiac arrhythmias: Secondary | ICD-10-CM

## 2013-05-06 LAB — REMOTE PACEMAKER DEVICE
AL IMPEDENCE PM: 415 Ohm
AL THRESHOLD: 0.875 V
RV LEAD IMPEDENCE PM: 544 Ohm
RV LEAD THRESHOLD: 1 V

## 2013-05-18 ENCOUNTER — Telehealth: Payer: Self-pay | Admitting: Internal Medicine

## 2013-05-18 ENCOUNTER — Encounter: Payer: Self-pay | Admitting: *Deleted

## 2013-05-18 NOTE — Telephone Encounter (Signed)
Transmission received, patient aware. 

## 2013-05-18 NOTE — Telephone Encounter (Signed)
New Prob     Pt would like to know if her transmission was received.

## 2013-05-20 ENCOUNTER — Other Ambulatory Visit: Payer: Self-pay

## 2013-05-20 MED ORDER — VALSARTAN 160 MG PO TABS: 160.0000 mg | ORAL_TABLET | Freq: Every day | ORAL | Status: DC

## 2013-05-20 NOTE — Telephone Encounter (Signed)
Pt request valsartan be sent to [facility]; less expensive than [facility]. Advised pt done. Spoke with Polly at [facility] and remaining refills cancelled.

## 2013-05-24 ENCOUNTER — Telehealth: Payer: Self-pay | Admitting: Internal Medicine

## 2013-05-24 NOTE — Telephone Encounter (Signed)
Spoke w/pt in regards to transmission. Transmission normal---AF episodes all less than 1 minute and 4 ventricular high rate episodes---longest was 11 beats. Pt seeing PCP for SOB. SOB comes and goes. Pt will call if SOB worsens. [facility] 08-02-13 and ROV in May with [facility].

## 2013-05-24 NOTE — Telephone Encounter (Signed)
New Problem:  Pt would like to get the results of her remote pacer check from a month ago. Pt also is complaining of shortness of breath and weakness on and off since Saturday. Pt states it is not an emergency but would like to be advised on her symptoms.

## 2013-05-25 ENCOUNTER — Encounter: Payer: Self-pay | Admitting: Family Medicine

## 2013-05-25 ENCOUNTER — Ambulatory Visit (INDEPENDENT_AMBULATORY_CARE_PROVIDER_SITE_OTHER): Payer: Medicare Other | Admitting: Family Medicine

## 2013-05-25 VITALS — BP 128/82 | HR 65 | Temp 98.6°F | Ht 62.0 in | Wt 153.8 lb

## 2013-05-25 DIAGNOSIS — I959 Hypotension, unspecified: Secondary | ICD-10-CM

## 2013-05-25 DIAGNOSIS — R0600 Dyspnea, unspecified: Secondary | ICD-10-CM | POA: Insufficient documentation

## 2013-05-25 DIAGNOSIS — Z23 Encounter for immunization: Secondary | ICD-10-CM

## 2013-05-25 DIAGNOSIS — R5381 Other malaise: Secondary | ICD-10-CM | POA: Insufficient documentation

## 2013-05-25 DIAGNOSIS — R0609 Other forms of dyspnea: Secondary | ICD-10-CM

## 2013-05-25 LAB — CBC WITH DIFFERENTIAL/PLATELET
Basophils Absolute: 0 10*3/uL (ref 0.0–0.1)
Eosinophils Absolute: 0.2 10*3/uL (ref 0.0–0.7)
MCHC: 33.7 g/dL (ref 30.0–36.0)
MCV: 90 fl (ref 78.0–100.0)
Monocytes Absolute: 0.6 10*3/uL (ref 0.1–1.0)
Neutrophils Relative %: 59 % (ref 43.0–77.0)
Platelets: 199 10*3/uL (ref 150.0–400.0)
RDW: 14.9 % — ABNORMAL HIGH (ref 11.5–14.6)

## 2013-05-25 LAB — COMPREHENSIVE METABOLIC PANEL
ALT: 13 U/L (ref 0–35)
AST: 22 U/L (ref 0–37)
Albumin: 4.3 g/dL (ref 3.5–5.2)
Alkaline Phosphatase: 56 U/L (ref 39–117)
BUN: 25 mg/dL — ABNORMAL HIGH (ref 6–23)
Potassium: 4.6 mEq/L (ref 3.5–5.1)
Sodium: 138 mEq/L (ref 135–145)
Total Protein: 7.2 g/dL (ref 6.0–8.3)

## 2013-05-25 NOTE — Progress Notes (Signed)
Subjective:    Patient ID: [name] [name], female    DOB: 04-11-1923, 79 y.o.   MRN: [id number]  HPI Here for episodes of shortness of breath that occurred over the weekend (resolved yesterday and today) She has been generally tired  Her bp has been dropping at home- and she thinks that this is the cause  No med today- and feels better  Denies cp/ sweating /nausea/fever/ pedal edema/ PND or orthopnea  No sick contacts or recent illnesses   No cp at all Recent heart monitor was "normal" per pt   Patient Active Problem List   Diagnosis Date Noted  . Dyspnea 05/25/2013  . Other malaise and fatigue 05/25/2013  . Cough 12/10/2012  . Hypotension, unspecified 10/21/2012  . Ecchymosis 10/21/2012  . Forgetfulness 05/15/2012  . Gynecological examination 09/27/2011  . Hip pain, left 09/27/2011  . Knee pain, left 09/27/2011  . Atrial fibrillation 06/06/2011  . PREMATURE VENTRICULAR CONTRACTIONS 12/25/2009  . PPM-Medtronic 12/25/2009  . INTERMITTENT VERTIGO 08/31/2009  . UNSPECIFIED VITAMIN D DEFICIENCY 10/03/2008  . OSTEOARTHRITIS 10/03/2008  . GANGLION CYST 10/03/2008  . HYPERCHOLESTEROLEMIA 06/16/2007  . ANXIETY 06/16/2007  . DEPRESSION 06/16/2007  . HYPERTENSION 06/16/2007  . PERICARDIAL EFFUSION 06/16/2007  . BRADYCARDIA 06/16/2007  . HEMORRHOIDS 06/16/2007  . VENOUS INSUFFICIENCY 06/16/2007  . ALLERGIC RHINITIS 06/16/2007  . GERD 06/16/2007  . OSTEOPENIA 06/16/2007  . MURMUR 06/16/2007   Past Medical History  Diagnosis Date  . Allergic rhinitis   . Anxiety   . Depression   . GERD (gastroesophageal reflux disease)   . Hypertension   . Osteopenia   . Bradycardia   . Hyperlipidemia   . Duodenal ulcer   . Other specified cardiac dysrhythmias(427.89)    Past Surgical History  Procedure Laterality Date  . Appendectomy    . Total abdominal hysterectomy    . Tonsillectomy    . Ankle fracture surgery  05/2002  . Insert / replace / remove pacemaker     History   Substance Use Topics  . Smoking status: Never Smoker   . Smokeless tobacco: Never Used  . Alcohol Use: Yes     Comment: infrequently-rare   Family History  Problem Relation Age of Onset  . Heart attack Father   . Depression Mother   . Alzheimer's disease Mother   . Pneumonia Mother   . Heart disease Brother     hx of CABG   Allergies  Allergen Reactions  . Ace Inhibitors     REACTION: cough  . Alendronate Sodium     REACTION: aches  . Avapro [Irbesartan]     hypotension  . Dabigatran Etexilate Mesylate Diarrhea    indigestion  . Nitrofurantoin     REACTION: rash  . Penicillins     REACTION: rash  . Prednisone Other (See Comments)    Extreme weakness   Current Outpatient Prescriptions on File Prior to Visit  Medication Sig Dispense Refill  . apixaban (ELIQUIS) 5 MG TABS tablet Take 1 tablet (5 mg total) by mouth 2 (two) times daily.  180 tablet  1  . cholecalciferol (VITAMIN D) 1000 UNITS tablet Take 2,000 Units by mouth daily.       Marland Kitchen guaiFENesin-codeine (ROBITUSSIN AC) 100-10 MG/5ML syrup Take 5 mLs by mouth 4 (four) times daily as needed for cough (watch out for sedation).  120 mL  0  . hydrochlorothiazide (HYDRODIURIL) 25 MG tablet Take 1 tablet (25 mg total) by mouth daily.  90 tablet  1  .  metoprolol tartrate (LOPRESSOR) 25 MG tablet Take 1 tablet (25 mg total) by mouth 2 (two) times daily.  180 tablet  1  . Multiple Vitamin (MULTIVITAMIN) tablet Take 1 tablet by mouth daily.        . sertraline (ZOLOFT) 100 MG tablet Take 1 tablet (100 mg total) by mouth daily.  90 tablet  0  . valsartan (DIOVAN) 160 MG tablet Take 1 tablet (160 mg total) by mouth daily.  90 tablet  2   No current facility-administered medications on file prior to visit.      Review of Systems Review of Systems  Constitutional: Negative for fever, appetite change, and unexpected weight change. pos for fatigue Eyes: Negative for pain and visual disturbance.  Respiratory: Negative for cough  and shortness of breath.   Cardiovascular: Negative for cp or palpitations   neg for PND or orthopnea  Gastrointestinal: Negative for nausea, diarrhea and constipation.  Genitourinary: Negative for urgency and frequency.  Skin: Negative for pallor or rash   Neurological: Negative for weakness, light-headedness, numbness and headaches.  Hematological: Negative for adenopathy. Does not bruise/bleed easily.  Psychiatric/Behavioral: Negative for dysphoric mood. The patient is not nervous/anxious.         Objective:   Physical Exam  Constitutional: She appears well-developed and well-nourished. No distress.  HENT:  Head: Normocephalic and atraumatic.  Right Ear: External ear normal.  Left Ear: External ear normal.  Nose: Nose normal.  Mouth/Throat: Oropharynx is clear and moist.  Eyes: Conjunctivae and EOM are normal. Pupils are equal, round, and reactive to light. Right eye exhibits no discharge. Left eye exhibits no discharge. No scleral icterus.  Neck: Normal range of motion. No JVD present. No thyromegaly present.  Cardiovascular: Normal rate, regular rhythm, normal heart sounds and intact distal pulses.  Exam reveals no gallop.   No murmur heard. Pulmonary/Chest: Effort normal and breath sounds normal. No respiratory distress. She has no wheezes. She has no rales.  Abdominal: Soft. Bowel sounds are normal. She exhibits no distension and no mass. There is no tenderness.  Musculoskeletal: She exhibits no edema and no tenderness.  No leg tenderness or palp cords or swelling  Some varicosities   Lymphadenopathy:    She has no cervical adenopathy.  Neurological: She is alert. She has normal reflexes. No cranial nerve deficit. Coordination normal.  Skin: Skin is warm and dry. No rash noted. No erythema. No pallor.  Psychiatric: She has a normal mood and affect.          Assessment & Plan:

## 2013-05-25 NOTE — Assessment & Plan Note (Signed)
Per pt - she is having low bp episodes at home I asked her to hold her diovan and watch bp and symptoms  ? Cause of intermittent non focal weakness and fatigue ?- will monitor and f/u planned to watch bp

## 2013-05-25 NOTE — Patient Instructions (Addendum)
Hold your generic diovan for 3 days and then call and update me with how your blood pressure is and how you are feeling If shortness of breath comes back -let me know Labs today  Exam reassuring Flu shot today

## 2013-05-25 NOTE — Assessment & Plan Note (Signed)
Transient-gone without other symptoms  Nl exam today Lab today  Will update if symptoms return

## 2013-05-25 NOTE — Assessment & Plan Note (Signed)
Reassuring nl exam Pt suspects this was from low bp No meds today- and feels better inst to hold the diovan and f/u - will watch bp  Will call if the sob returns

## 2013-06-01 ENCOUNTER — Encounter: Payer: Self-pay | Admitting: Internal Medicine

## 2013-06-21 ENCOUNTER — Telehealth: Payer: Self-pay | Admitting: Gastroenterology

## 2013-06-21 ENCOUNTER — Encounter: Payer: Self-pay | Admitting: Family Medicine

## 2013-06-21 ENCOUNTER — Ambulatory Visit (INDEPENDENT_AMBULATORY_CARE_PROVIDER_SITE_OTHER): Payer: Medicare Other | Admitting: Family Medicine

## 2013-06-21 VITALS — BP 128/78 | HR 85 | Temp 98.2°F | Ht 62.0 in | Wt 155.5 lb

## 2013-06-21 DIAGNOSIS — R195 Other fecal abnormalities: Secondary | ICD-10-CM

## 2013-06-21 DIAGNOSIS — I10 Essential (primary) hypertension: Secondary | ICD-10-CM

## 2013-06-21 DIAGNOSIS — I4891 Unspecified atrial fibrillation: Secondary | ICD-10-CM

## 2013-06-21 DIAGNOSIS — K219 Gastro-esophageal reflux disease without esophagitis: Secondary | ICD-10-CM

## 2013-06-21 MED ORDER — OMEPRAZOLE 20 MG PO CPDR: 20.0000 mg | DELAYED_RELEASE_CAPSULE | Freq: Every day | ORAL | Status: DC

## 2013-06-21 NOTE — Assessment & Plan Note (Signed)
Worrisome for melena  Minimal upper abd symptoms Start ppi Ref to [facility] today  Pt aware- if worse to go to ER

## 2013-06-21 NOTE — Assessment & Plan Note (Signed)
Pt continues to have orthostatic hypotension episodes despite stopping diovan  Will hold beta blocker Lab (in face of potential melena)

## 2013-06-21 NOTE — Progress Notes (Signed)
Subjective:    Patient ID: [name] [name], female    DOB: 06-05-1923, 79 y.o.   MRN: [id number]  HPI Here with dark stools  2 weeks  Has held her eloquis in case of bleeding (on that for a fib) Overall feels ok  No pepto/ no dark foods / no iron supplements  Had some pressure sensation low abdomen - both sides , and abdomen hurt in RUQ once fleetingly That stopped after a week and then stool changed  No upper GI pain  occ indigestion - occ after a meal -not every day 2-3 times per week  Lots of belching and gas    Has been holding her diovan  Still has episodes of pre syncope- ? Low bp -- does not loose consciousness  BP Readings from Last 3 Encounters:  06/21/13 128/78  05/25/13 128/82  02/08/13 150/92   bp at home goes down to 70s systolic  No trouble with his pacemaker-is checked every 3 months - always gets a good report   Patient Active Problem List   Diagnosis Date Noted  . Dyspnea 05/25/2013  . Other malaise and fatigue 05/25/2013  . Cough 12/10/2012  . Hypotension, unspecified 10/21/2012  . Ecchymosis 10/21/2012  . Forgetfulness 05/15/2012  . Gynecological examination 09/27/2011  . Hip pain, left 09/27/2011  . Knee pain, left 09/27/2011  . Atrial fibrillation 06/06/2011  . PREMATURE VENTRICULAR CONTRACTIONS 12/25/2009  . PPM-Medtronic 12/25/2009  . INTERMITTENT VERTIGO 08/31/2009  . UNSPECIFIED VITAMIN D DEFICIENCY 10/03/2008  . OSTEOARTHRITIS 10/03/2008  . GANGLION CYST 10/03/2008  . HYPERCHOLESTEROLEMIA 06/16/2007  . ANXIETY 06/16/2007  . DEPRESSION 06/16/2007  . HYPERTENSION 06/16/2007  . PERICARDIAL EFFUSION 06/16/2007  . BRADYCARDIA 06/16/2007  . HEMORRHOIDS 06/16/2007  . VENOUS INSUFFICIENCY 06/16/2007  . ALLERGIC RHINITIS 06/16/2007  . GERD 06/16/2007  . OSTEOPENIA 06/16/2007  . MURMUR 06/16/2007   Past Medical History  Diagnosis Date  . Allergic rhinitis   . Anxiety   . Depression   . GERD (gastroesophageal reflux disease)   .  Hypertension   . Osteopenia   . Bradycardia   . Hyperlipidemia   . Duodenal ulcer   . Other specified cardiac dysrhythmias(427.89)    Past Surgical History  Procedure Laterality Date  . Appendectomy    . Total abdominal hysterectomy    . Tonsillectomy    . Ankle fracture surgery  05/2002  . Insert / replace / remove pacemaker     History  Substance Use Topics  . Smoking status: Never Smoker   . Smokeless tobacco: Never Used  . Alcohol Use: Yes     Comment: infrequently-rare   Family History  Problem Relation Age of Onset  . Heart attack Father   . Depression Mother   . Alzheimer's disease Mother   . Pneumonia Mother   . Heart disease Brother     hx of CABG   Allergies  Allergen Reactions  . Ace Inhibitors     REACTION: cough  . Alendronate Sodium     REACTION: aches  . Avapro [Irbesartan]     hypotension  . Dabigatran Etexilate Mesylate Diarrhea    indigestion  . Nitrofurantoin     REACTION: rash  . Penicillins     REACTION: rash  . Prednisone Other (See Comments)    Extreme weakness   Current Outpatient Prescriptions on File Prior to Visit  Medication Sig Dispense Refill  . cholecalciferol (VITAMIN D) 1000 UNITS tablet Take 2,000 Units by mouth daily.       Marland Kitchen  hydrochlorothiazide (HYDRODIURIL) 25 MG tablet Take 1 tablet (25 mg total) by mouth daily.  90 tablet  1  . metoprolol tartrate (LOPRESSOR) 25 MG tablet Take 1 tablet (25 mg total) by mouth 2 (two) times daily.  180 tablet  1  . Multiple Vitamin (MULTIVITAMIN) tablet Take 1 tablet by mouth daily.        . sertraline (ZOLOFT) 100 MG tablet Take 1 tablet (100 mg total) by mouth daily.  90 tablet  0  . valsartan (DIOVAN) 160 MG tablet Take 1 tablet (160 mg total) by mouth daily.  90 tablet  2  . apixaban (ELIQUIS) 5 MG TABS tablet Take 1 tablet (5 mg total) by mouth 2 (two) times daily.  180 tablet  1   No current facility-administered medications on file prior to visit.      Review of Systems Review  of Systems  Constitutional: Negative for fever, appetite change,  and unexpected weight change.  Eyes: Negative for pain and visual disturbance.  Respiratory: Negative for cough and shortness of breath.   Cardiovascular: Negative for cp or palpitations   pos for episodes of low bp Gastrointestinal: Negative for nausea, diarrhea and constipation. pos for dark stool/ neg for brb in stool Genitourinary: Negative for urgency and frequency.  Skin: Negative for pallor or rash   Neurological: Negative for weakness, light-headedness, numbness and headaches. pos for spells of pre syncope with low bp  Hematological: Negative for adenopathy. Does not bruise/bleed easily.  Psychiatric/Behavioral: Negative for dysphoric mood. The patient is not nervous/anxious.         Objective:   Physical Exam  Constitutional: She appears well-developed and well-nourished. No distress.  HENT:  Head: Normocephalic and atraumatic.  Right Ear: External ear normal.  Left Ear: External ear normal.  Mouth/Throat: Oropharynx is clear and moist.  Eyes: Conjunctivae and EOM are normal. Pupils are equal, round, and reactive to light. No scleral icterus.  Neck: Normal range of motion. Neck supple. No JVD present. Carotid bruit is not present. No thyromegaly present.  Cardiovascular: Normal rate, regular rhythm, normal heart sounds and intact distal pulses.  Exam reveals no gallop.   Pulmonary/Chest: Effort normal and breath sounds normal. No respiratory distress. She has no wheezes. She exhibits no tenderness.  Abdominal: Soft. Bowel sounds are normal. She exhibits no distension, no abdominal bruit and no mass. There is no tenderness.  Genitourinary: Rectal exam shows no external hemorrhoid, no fissure, no mass, no tenderness and anal tone normal. Guaiac positive stool.  Musculoskeletal: Normal range of motion. She exhibits no edema and no tenderness.  Lymphadenopathy:    She has no cervical adenopathy.  Neurological: She  is alert. She has normal reflexes. No cranial nerve deficit. She exhibits normal muscle tone. Coordination normal.  Skin: Skin is warm and dry. No rash noted. No erythema. No pallor.  No pallor Nl skin turgor   Psychiatric: She has a normal mood and affect.          Assessment & Plan:

## 2013-06-21 NOTE — Patient Instructions (Addendum)
Stay off eluquis for now  Start omeprazole daily (this is for acid reflux and stomach ulcers) Lab today We will refer you to GI at check out  In light of low blood pressure please hold your metoprolol (lopressor)  for now  If symptoms suddenly worsen-call and go to the ER

## 2013-06-21 NOTE — Assessment & Plan Note (Signed)
With heme pos dark stool/melena One fleeting stomach cramp  Remote hx of duod ulcer  Ref to GI asap Lab today Start omeprazole 20 qd Will go to ER if abd pain or worse

## 2013-06-21 NOTE — Assessment & Plan Note (Signed)
In nl rhythm today  Holding eloquis today in light of heme pos stool

## 2013-06-22 ENCOUNTER — Encounter: Payer: Self-pay | Admitting: Internal Medicine

## 2013-06-22 ENCOUNTER — Telehealth: Payer: Self-pay | Admitting: Gastroenterology

## 2013-06-22 LAB — COMPREHENSIVE METABOLIC PANEL
ALT: 12 U/L (ref 0–35)
BUN: 30 mg/dL — ABNORMAL HIGH (ref 6–23)
CO2: 30 mEq/L (ref 19–32)
Calcium: 9.2 mg/dL (ref 8.4–10.5)
Chloride: 104 mEq/L (ref 96–112)
GFR: 49.31 mL/min — ABNORMAL LOW (ref >60.00)
Sodium: 140 mEq/L (ref 135–145)
Total Protein: 6.7 g/dL (ref 6.0–8.3)

## 2013-06-22 LAB — CBC WITH DIFFERENTIAL/PLATELET
Basophils Absolute: 0.2 10*3/uL — ABNORMAL HIGH (ref 0.0–0.1)
Basophils Relative: 2.2 % (ref 0.0–3.0)
Eosinophils Relative: 2.4 % (ref 0.0–5.0)
Hemoglobin: 8.4 g/dL — ABNORMAL LOW (ref 12.0–15.0)
Lymphocytes Relative: 29.7 % (ref 12.0–46.0)
MCV: 91.3 fl (ref 78.0–100.0)
Monocytes Relative: 4.4 % (ref 3.0–12.0)
Neutro Abs: 5.3 10*3/uL (ref 1.4–7.7)
Platelets: 225 10*3/uL (ref 150.0–400.0)
RBC: 2.72 Mil/uL — ABNORMAL LOW (ref 3.87–5.11)
RDW: 16.2 % — ABNORMAL HIGH (ref 11.5–14.6)
WBC: 8.6 10*3/uL (ref 4.5–10.5)

## 2013-06-22 NOTE — Telephone Encounter (Signed)
Patient rescheduled for tomorrow with Mike Gip PA

## 2013-06-22 NOTE — Telephone Encounter (Signed)
Pt has been scheduled.  °

## 2013-06-23 ENCOUNTER — Encounter: Payer: Self-pay | Admitting: Physician Assistant

## 2013-06-23 ENCOUNTER — Inpatient Hospital Stay (HOSPITAL_COMMUNITY)
Admission: AD | Admit: 2013-06-23 | Discharge: 2013-06-26 | DRG: 378 | Disposition: A | Discharge status: Home / Self Care | Payer: Medicare Other | Source: Ambulatory Visit | Attending: Internal Medicine | Admitting: Internal Medicine

## 2013-06-23 ENCOUNTER — Encounter (HOSPITAL_COMMUNITY): Payer: Self-pay | Admitting: *Deleted

## 2013-06-23 ENCOUNTER — Ambulatory Visit (INDEPENDENT_AMBULATORY_CARE_PROVIDER_SITE_OTHER): Payer: Medicare Other | Admitting: Physician Assistant

## 2013-06-23 VITALS — BP 92/60 | HR 80 | Ht 61.42 in | Wt 152.5 lb

## 2013-06-23 DIAGNOSIS — Z8711 Personal history of peptic ulcer disease: Secondary | ICD-10-CM

## 2013-06-23 DIAGNOSIS — K922 Gastrointestinal hemorrhage, unspecified: Secondary | ICD-10-CM

## 2013-06-23 DIAGNOSIS — Z950 Presence of cardiac pacemaker: Secondary | ICD-10-CM

## 2013-06-23 DIAGNOSIS — K5730 Diverticulosis of large intestine without perforation or abscess without bleeding: Secondary | ICD-10-CM | POA: Diagnosis present

## 2013-06-23 DIAGNOSIS — E785 Hyperlipidemia, unspecified: Secondary | ICD-10-CM | POA: Diagnosis present

## 2013-06-23 DIAGNOSIS — I495 Sick sinus syndrome: Secondary | ICD-10-CM | POA: Diagnosis present

## 2013-06-23 DIAGNOSIS — D62 Acute posthemorrhagic anemia: Secondary | ICD-10-CM | POA: Diagnosis present

## 2013-06-23 DIAGNOSIS — I4891 Unspecified atrial fibrillation: Secondary | ICD-10-CM | POA: Diagnosis present

## 2013-06-23 DIAGNOSIS — F3289 Other specified depressive episodes: Secondary | ICD-10-CM | POA: Diagnosis present

## 2013-06-23 DIAGNOSIS — K921 Melena: Secondary | ICD-10-CM

## 2013-06-23 DIAGNOSIS — F411 Generalized anxiety disorder: Secondary | ICD-10-CM | POA: Diagnosis present

## 2013-06-23 DIAGNOSIS — I2510 Atherosclerotic heart disease of native coronary artery without angina pectoris: Secondary | ICD-10-CM | POA: Diagnosis present

## 2013-06-23 DIAGNOSIS — F329 Major depressive disorder, single episode, unspecified: Secondary | ICD-10-CM | POA: Diagnosis present

## 2013-06-23 DIAGNOSIS — K5520 Angiodysplasia of colon without hemorrhage: Secondary | ICD-10-CM

## 2013-06-23 DIAGNOSIS — R5381 Other malaise: Secondary | ICD-10-CM

## 2013-06-23 DIAGNOSIS — F4323 Adjustment disorder with mixed anxiety and depressed mood: Secondary | ICD-10-CM | POA: Diagnosis present

## 2013-06-23 DIAGNOSIS — Z79899 Other long term (current) drug therapy: Secondary | ICD-10-CM

## 2013-06-23 DIAGNOSIS — K219 Gastro-esophageal reflux disease without esophagitis: Secondary | ICD-10-CM | POA: Diagnosis present

## 2013-06-23 DIAGNOSIS — I10 Essential (primary) hypertension: Secondary | ICD-10-CM | POA: Diagnosis present

## 2013-06-23 DIAGNOSIS — K5521 Angiodysplasia of colon with hemorrhage: Secondary | ICD-10-CM

## 2013-06-23 DIAGNOSIS — Z880 Allergy status to penicillin: Secondary | ICD-10-CM

## 2013-06-23 DIAGNOSIS — Z7901 Long term (current) use of anticoagulants: Secondary | ICD-10-CM

## 2013-06-23 DIAGNOSIS — D500 Iron deficiency anemia secondary to blood loss (chronic): Secondary | ICD-10-CM | POA: Diagnosis present

## 2013-06-23 DIAGNOSIS — R531 Weakness: Secondary | ICD-10-CM

## 2013-06-23 LAB — COMPREHENSIVE METABOLIC PANEL
ALT: 9 U/L (ref 0–35)
AST: 17 U/L (ref 0–37)
Albumin: 3.7 g/dL (ref 3.5–5.2)
BUN: 32 mg/dL — ABNORMAL HIGH (ref 6–23)
CO2: 26 mEq/L (ref 19–32)
Chloride: 102 mEq/L (ref 96–112)
Creatinine, Ser: 1.03 mg/dL (ref 0.50–1.10)
GFR calc non Af Amer: 47 mL/min — ABNORMAL LOW (ref >90)
Sodium: 138 mEq/L (ref 135–145)
Total Bilirubin: 0.2 mg/dL — ABNORMAL LOW (ref 0.3–1.2)

## 2013-06-23 LAB — PROTIME-INR: INR: 1.03 (ref 0.00–1.49)

## 2013-06-23 LAB — CBC
MCH: 30.4 pg (ref 26.0–34.0)
MCHC: 33.2 g/dL (ref 30.0–36.0)
MCV: 91.5 fL (ref 78.0–100.0)
Platelets: 206 10*3/uL (ref 150–400)
RBC: 2.6 MIL/uL — ABNORMAL LOW (ref 3.87–5.11)
RDW: 15.7 % — ABNORMAL HIGH (ref 11.5–15.5)

## 2013-06-23 LAB — PREPARE RBC (CROSSMATCH)

## 2013-06-23 MED ORDER — ONDANSETRON HCL 4 MG PO TABS: 4.0000 mg | ORAL_TABLET | Freq: Four times a day (QID) | ORAL | Status: DC | PRN

## 2013-06-23 MED ORDER — VITAMIN D3 25 MCG (1000 UNIT) PO TABS
2000.0000 [IU] | ORAL_TABLET | Freq: Every day | ORAL | Status: DC
  Administered 2013-06-23 – 2013-06-26 (×2): 2000 [IU] via ORAL
  Filled 2013-06-23 (×4): qty 2

## 2013-06-23 MED ORDER — ACETAMINOPHEN 325 MG PO TABS
650.0000 mg | ORAL_TABLET | Freq: Once | ORAL | Status: AC
  Administered 2013-06-23: 17:00:00 650 mg via ORAL
  Filled 2013-06-23: qty 2

## 2013-06-23 MED ORDER — ACETAMINOPHEN 325 MG PO TABS: 650.0000 mg | ORAL_TABLET | Freq: Four times a day (QID) | ORAL | Status: DC | PRN

## 2013-06-23 MED ORDER — PANTOPRAZOLE SODIUM 40 MG IV SOLR
40.0000 mg | Freq: Two times a day (BID) | INTRAVENOUS | Status: DC
  Administered 2013-06-23 – 2013-06-26 (×6): 40 mg via INTRAVENOUS
  Filled 2013-06-23 (×7): qty 40

## 2013-06-23 MED ORDER — ACETAMINOPHEN 650 MG RE SUPP: 650.0000 mg | Freq: Four times a day (QID) | RECTAL | Status: DC | PRN

## 2013-06-23 MED ORDER — SERTRALINE HCL 100 MG PO TABS
100.0000 mg | ORAL_TABLET | Freq: Every day | ORAL | Status: DC
  Administered 2013-06-23 – 2013-06-26 (×3): 100 mg via ORAL
  Filled 2013-06-23 (×4): qty 1

## 2013-06-23 MED ORDER — SODIUM CHLORIDE 0.9 % IJ SOLN
3.0000 mL | Freq: Two times a day (BID) | INTRAMUSCULAR | Status: DC
  Administered 2013-06-23 – 2013-06-24 (×3): 3 mL via INTRAVENOUS

## 2013-06-23 MED ORDER — KCL IN DEXTROSE-NACL 20-5-0.9 MEQ/L-%-% IV SOLN
INTRAVENOUS | Status: DC
  Administered 2013-06-23 – 2013-06-25 (×3): via INTRAVENOUS
  Filled 2013-06-23 (×4): qty 1000

## 2013-06-23 MED ORDER — ONDANSETRON HCL 4 MG/2ML IJ SOLN: 4.0000 mg | Freq: Four times a day (QID) | INTRAMUSCULAR | Status: DC | PRN

## 2013-06-23 NOTE — Patient Instructions (Signed)
Go to [facility] Admitting department, 1st floor. You will be on the 4th floor.

## 2013-06-23 NOTE — H&P (Signed)
[facility] History and Physical  [name] [name] ZOX:[id number] DOB: [birth year] DOA: 06/23/2013  Referring physician:  PCP: Roxy Manns, MD  Specialists:   Chief Complaint: GI bleeding   HPI: [name] [name] is a 79 y.o. female with PMH of HTN, HPL, SSS s/p PPM, a fib on Elliquis presented to GI office with hematochezia and recommended admission; patient reports having black stools for two weeks associated with intermittent dizziness, and DOE for the last two days; denies abdominal pain, no nausea vomiting or diarrhea;  -she stopped Elliquis more than a week ago due to bleeding;   Review of Systems: The patient denies anorexia, fever, weight loss,, vision loss, decreased hearing, hoarseness, chest pain, syncope,  peripheral edema, balance deficits, hemoptysis, abdominal pain,  severe indigestion/heartburn, hematuria, incontinence, genital sores, muscle weakness, suspicious skin lesions, transient blindness, difficulty walking, depression, unusual weight change, abnormal bleeding, enlarged lymph nodes, angioedema, and breast masses.    Past Medical History  Diagnosis Date  . Allergic rhinitis   . Anxiety   . Depression   . GERD (gastroesophageal reflux disease)   . Hypertension   . Osteopenia   . Bradycardia   . Hyperlipidemia   . Duodenal ulcer   . Other specified cardiac dysrhythmias(427.89)    Past Surgical History  Procedure Laterality Date  . Appendectomy    . Total abdominal hysterectomy    . Tonsillectomy    . Ankle fracture surgery  05/2002  . Insert / replace / remove pacemaker    . Cardiac defibrillator placement     Social History:  reports that she has never smoked. She has never used smokeless tobacco. She reports that she drinks alcohol. She reports that she does not use illicit drugs. Home:  where does patient live--home, ALF, SNF? and with whom if at home? Yes:  Can patient participate in ADLs?  Allergies  Allergen Reactions  . Ace  Inhibitors     REACTION: cough  . Alendronate Sodium     REACTION: aches  . Avapro [Irbesartan]     hypotension  . Dabigatran Etexilate Mesylate Diarrhea    indigestion  . Nitrofurantoin     REACTION: rash  . Penicillins     REACTION: rash  . Prednisone Other (See Comments)    Extreme weakness    Family History  Problem Relation Age of Onset  . Heart attack Father   . Depression Mother   . Alzheimer's disease Mother   . Pneumonia Mother   . Heart disease Brother     hx of CABG    (be sure to complete)  Prior to Admission medications   Medication Sig Start Date End Date Taking? Authorizing Provider  cholecalciferol (VITAMIN D) 1000 UNITS tablet Take 2,000 Units by mouth daily.    Yes Historical Provider, MD  hydrochlorothiazide (HYDRODIURIL) 25 MG tablet Take 1 tablet (25 mg total) by mouth daily. 03/24/13  Yes Judy Pimple, MD  Multiple Vitamin (MULTIVITAMIN) tablet Take 1 tablet by mouth daily.     Yes Historical Provider, MD  omeprazole (PRILOSEC) 20 MG capsule Take 1 capsule (20 mg total) by mouth daily. 06/21/13  Yes Judy Pimple, MD  sertraline (ZOLOFT) 100 MG tablet Take 1 tablet (100 mg total) by mouth daily. 03/24/13  Yes Judy Pimple, MD   Physical Exam: Filed Vitals:   06/23/13 1503  BP: 136/80  Pulse: 89  Temp: 98.1 F (36.7 C)  Resp: 20     General:  alert  Eyes: EOM-i  ENT: no oral ulcers   Neck: supple  Cardiovascular: s1,s2 rrr  Respiratory: CTA bl   Abdomen: soft, nt, nd   Skin: no rash   Musculoskeletal: no LE edema   Psychiatric: no hallucinations   Neurologic: CN 2-12 intact, moptor 5/5 symmetric   Labs on Admission:  Basic Metabolic Panel:  Recent Labs Lab 06/21/13 1648  NA 140  K 3.9  CL 104  CO2 30  GLUCOSE 121*  BUN 30*  CREATININE 1.1  CALCIUM 9.2   Liver Function Tests:  Recent Labs Lab 06/21/13 1648  AST 22  ALT 12  ALKPHOS 51  BILITOT 0.4  PROT 6.7  ALBUMIN 4.0   No results found for this  basename: LIPASE, AMYLASE,  in the last 168 hours No results found for this basename: AMMONIA,  in the last 168 hours CBC:  Recent Labs Lab 06/21/13 1648  WBC 8.6  NEUTROABS 5.3  HGB 8.4 Repeated and verified X2.*  HCT 24.9 Repeated and verified X2.*  MCV 91.3  PLT 225.0   Cardiac Enzymes: No results found for this basename: CKTOTAL, CKMB, CKMBINDEX, TROPONINI,  in the last 168 hours  BNP (last 3 results) No results found for this basename: PROBNP,  in the last 8760 hours CBG: No results found for this basename: GLUCAP,  in the last 168 hours  Radiological Exams on Admission: No results found.  EKG: Independently reviewed. Not done   Assessment/Plan Active Problems:   GI bleed   Acute blood loss anemia   HYPERTENSION   Atrial fibrillation   79 y.o. female with PMH of HTN, HPL, SSS s/p PPM, a fib on Elliquis presented to GI office with hematochezia   1. GIB hemodynamically stable; ? LGIB/diverticula but per patient h/o PUD  -TF two units per GI; cont PPI; monitor Hg; TF prn; NPO, IVF; EGD in AM per GI   2. Acute blood loss anemia due to GIB; TF prn; endoscopy as above   3. A fib on Elliquis; HR controlled not on BB;   -Elliquis on hold due to GIB; obtain ECG; chads -2; d/w patient and family the need to hold [facility] with GIB;   4. HTN stable; hold diuretic while on IVF; prn hydralazine; cont home regimen    DVT prophylaxis: SCD  GI:  if consultant consulted, please document name and whether formally or informally consulted  Code Status: full (must indicate code status--if unknown or must be presumed, indicate so) Family Communication: daughter at the bedside  (indicate person spoken with, if applicable, with phone number if by telephone) Disposition Plan: home when ready (indicate anticipated LOS)  Time spent: >45 minutes   Esperanza Sheets [facility] Pager [phone]  If 7PM-7AM, please contact night-coverage [email or web address] Password [facility] 06/23/2013,  4:13 PM

## 2013-06-23 NOTE — Progress Notes (Signed)
Case discussed in detail with advanced extender. Will be admitted to hospitalist service with plans for transfusion, PPI, and EGD tomorrow. I am on call tonight if needed.

## 2013-06-23 NOTE — Progress Notes (Signed)
Subjective:    Patient ID: [name] [name], female    DOB: Mar 22, 1923, 79 y.o.   MRN: [id number]  HPI [name] [name] is a very nice 79 year old white female new to GI today referred by Dr. Milinda Antis for evaluation of anemia and Hemoccult-positive stool. Patient reports that she did have a remote colonoscopy done in [place] probably 15 years ago which was unremarkable and says that she had an upper endoscopy done through [facility] 20-25 years ago and was told that she had duodenal ulcer. Patient had been on Eliquis for atrial fibrillation over the past year and a half, also has history of bradycardia and is status post pacemaker. She also has history of hypertension and anxiety. Patient relates onset of black stools approximately 2 weeks ago and says that she has been having one black bowel movement each day. She has not noticed any change in her bowel habits nor any bright red blood. She denies any abdominal pain or discomfort. She has not had any nausea or vomiting and her appetite has been fine. She does admit that her energy level has been low,and she has noticed more shortness of breath with exertion than usual. She denies chest pain. She did have a dizzy spell this morning at home when she had walked from her bedroom to the kitchen which resolved with sitting down. She has not been on any aspirin or NSAIDs. She stopped the Elliquis   about 5 days ago because of the bleeding. She was seen by primary care on Monday 10/27 and had labs done showing hemoglobin of 8.4 hematocrit of 24.9 MCV of 91 and platelets of 225 last hemoglobin check in March her hemoglobin of 12. She has been on Prilosec for GERD and has continued this.     Review of Systems  Constitutional: Positive for activity change and fatigue.  HENT: Negative.   Eyes: Negative.   Respiratory: Positive for shortness of breath.   Cardiovascular: Negative.   Gastrointestinal: Positive for blood in stool.  Endocrine: Negative.    Genitourinary: Negative.   Allergic/Immunologic: Negative.   Neurological: Positive for weakness.  Hematological: Negative.   Psychiatric/Behavioral: Negative.    No facility-administered medications prior to visit.   Outpatient Prescriptions Prior to Visit  Medication Sig Dispense Refill  . cholecalciferol (VITAMIN D) 1000 UNITS tablet Take 2,000 Units by mouth daily.       . hydrochlorothiazide (HYDRODIURIL) 25 MG tablet Take 1 tablet (25 mg total) by mouth daily.  90 tablet  1  . Multiple Vitamin (MULTIVITAMIN) tablet Take 1 tablet by mouth daily.        Marland Kitchen omeprazole (PRILOSEC) 20 MG capsule Take 1 capsule (20 mg total) by mouth daily.  30 capsule  3  . sertraline (ZOLOFT) 100 MG tablet Take 1 tablet (100 mg total) by mouth daily.  90 tablet  0  . metoprolol tartrate (LOPRESSOR) 25 MG tablet Take 1 tablet (25 mg total) by mouth 2 (two) times daily.  180 tablet  1   Allergies  Allergen Reactions  . Ace Inhibitors     REACTION: cough  . Alendronate Sodium     REACTION: aches  . Avapro [Irbesartan]     hypotension  . Dabigatran Etexilate Mesylate Diarrhea    indigestion  . Nitrofurantoin     REACTION: rash  . Penicillins     REACTION: rash  . Prednisone Other (See Comments)    Extreme weakness   Patient Active Problem List   Diagnosis Date Noted  .  Heme positive stool 06/21/2013  . Dyspnea 05/25/2013  . Other malaise and fatigue 05/25/2013  . Cough 12/10/2012  . Hypotension, unspecified 10/21/2012  . Ecchymosis 10/21/2012  . Forgetfulness 05/15/2012  . Gynecological examination 09/27/2011  . Hip pain, left 09/27/2011  . Knee pain, left 09/27/2011  . Atrial fibrillation 06/06/2011  . PREMATURE VENTRICULAR CONTRACTIONS 12/25/2009  . PPM-[employer]tr[employer] 12/25/2009  . INTERMITTENT VERTIGO 08/31/2009  . UNSPECIFIED VITAMIN D DEFICIENCY 10/03/2008  . OSTEOARTHRITIS 10/03/2008  . GANGLION CYST 10/03/2008  . HYPERCHOLESTEROLEMIA 06/16/2007  . ANXIETY 06/16/2007  .  DEPRESSION 06/16/2007  . HYPERTENSION 06/16/2007  . PERICARDIAL EFFUSION 06/16/2007  . BRADYCARDIA 06/16/2007  . HEMORRHOIDS 06/16/2007  . VENOUS INSUFFICIENCY 06/16/2007  . ALLERGIC RHINITIS 06/16/2007  . GERD 06/16/2007  . OSTEOPENIA 06/16/2007  . MURMUR 06/16/2007   History  Substance Use Topics  . Smoking status: Never Smoker   . Smokeless tobacco: Never Used  . Alcohol Use: Yes     Comment: infrequently-rare   family history includes Alzheimer's disease in her mother; Depression in her mother; Heart attack in her father; Heart disease in her brother; Pneumonia in her mother.     Objective:   Physical Exam  well-developed elderly white female in no acute distress, accompanied by her daughter blood pressure 92/60 pulse 80 height 5 foot 1 weight 152. HEENT; nontraumatic normocephalic EOMI PERRLA sclera are anicteric conjunctiva are pale lips somewhat pale, Supple; no JVD, Cardiovascular; irregular rate and rhythm with S1-S2 no murmur or gallop, Pulmonary; clear bilaterally, Abdomen; soft nontender nondistended bowel sounds are active there is no palpable mass or hepatosplenomegaly, Rectal; exam very dark brown stool with reddish tinge 4+ heme positive, Extremities; no clubbing cyanosis or edema skin warm and dry, Psych; mood and affect normal and appropriate      Assessment & Plan:  # 54 79 year old female with slow GI bleed x2 weeks in setting of anticoagulation with Eliquis.-Off anticoagulation x5 days with continued bleeding. Suspect upper GI source, rule out peptic ulcer disease, cannot rule out a slow lower GI bleed. #2 normocytic anemia secondary to acute blood loss, symptomatic #3 weakness and lightheadedness secondary to above #4 remote history of duodenal ulcer #5 history of bradycardia status post pacemaker #6 hypertension #7 anxiety and depression #8 chronic GERD  Plan; patient is admitted to [facility] long on the hospitalist service. Will plan to transfuse 2 units of  packed RBCs this evening and start IV Protonix twice daily. Serial hemoglobins Full liquid diet Have scheduled for upper endoscopy with Dr. Marina Goodell tomorrow 06/24/2013-if EGD is unrevealing she will need colonoscopy. Plans were discussed with the patient and her daughter and they are agreeable We'll plan to leave off Eliquis  for the time being and will need to discuss with cardiology regarding risk benefit of resuminggiven her advanced age etc-. once GI workup is complete.

## 2013-06-24 ENCOUNTER — Encounter (HOSPITAL_COMMUNITY): Payer: Self-pay

## 2013-06-24 ENCOUNTER — Encounter (HOSPITAL_COMMUNITY)
Admission: AD | Disposition: A | Discharge status: Home / Self Care | Payer: Self-pay | Source: Ambulatory Visit | Attending: Internal Medicine

## 2013-06-24 DIAGNOSIS — K922 Gastrointestinal hemorrhage, unspecified: Secondary | ICD-10-CM

## 2013-06-24 DIAGNOSIS — I10 Essential (primary) hypertension: Secondary | ICD-10-CM

## 2013-06-24 HISTORY — PX: ESOPHAGOGASTRODUODENOSCOPY: SHX5428

## 2013-06-24 LAB — CBC
MCH: 29.8 pg (ref 26.0–34.0)
MCHC: 33.1 g/dL (ref 30.0–36.0)
MCV: 90 fL (ref 78.0–100.0)
Platelets: 157 10*3/uL (ref 150–400)
RBC: 3.09 MIL/uL — ABNORMAL LOW (ref 3.87–5.11)
RDW: 15.4 % (ref 11.5–15.5)

## 2013-06-24 SURGERY — EGD (ESOPHAGOGASTRODUODENOSCOPY)
Anesthesia: Moderate Sedation

## 2013-06-24 MED ORDER — PEG-KCL-NACL-NASULF-NA ASC-C 100 G PO SOLR
0.5000 | Freq: Once | ORAL | Status: AC
  Administered 2013-06-24: 100 g via ORAL
  Filled 2013-06-24: qty 1

## 2013-06-24 MED ORDER — PEG-KCL-NACL-NASULF-NA ASC-C 100 G PO SOLR: 1.0000 | Freq: Once | ORAL | Status: DC

## 2013-06-24 MED ORDER — FENTANYL CITRATE 0.05 MG/ML IJ SOLN
INTRAMUSCULAR | Status: DC | PRN
  Administered 2013-06-24 (×2): 25 ug via INTRAVENOUS

## 2013-06-24 MED ORDER — HYDROCHLOROTHIAZIDE 25 MG PO TABS
25.0000 mg | ORAL_TABLET | Freq: Every day | ORAL | Status: DC
  Administered 2013-06-24 – 2013-06-26 (×3): 25 mg via ORAL
  Filled 2013-06-24 (×3): qty 1

## 2013-06-24 MED ORDER — MIDAZOLAM HCL 10 MG/2ML IJ SOLN
INTRAMUSCULAR | Status: DC | PRN
  Administered 2013-06-24 (×3): 1 mg via INTRAVENOUS

## 2013-06-24 MED ORDER — BUTAMBEN-TETRACAINE-BENZOCAINE 2-2-14 % EX AERO
INHALATION_SPRAY | CUTANEOUS | Status: DC | PRN
  Administered 2013-06-24: 14:00:00 2 via TOPICAL

## 2013-06-24 MED ORDER — MIDAZOLAM HCL 10 MG/2ML IJ SOLN
INTRAMUSCULAR | Status: AC
  Filled 2013-06-24: qty 2

## 2013-06-24 MED ORDER — FENTANYL CITRATE 0.05 MG/ML IJ SOLN
INTRAMUSCULAR | Status: AC
  Filled 2013-06-24: qty 2

## 2013-06-24 MED ORDER — PEG-KCL-NACL-NASULF-NA ASC-C 100 G PO SOLR
0.5000 | Freq: Once | ORAL | Status: AC
  Administered 2013-06-25: 09:00:00 100 g via ORAL
  Filled 2013-06-24: qty 1

## 2013-06-24 NOTE — Care Management Note (Signed)
    Page 1 of 1   06/24/2013     1:47:04 PM   CARE MANAGEMENT NOTE 06/24/2013  Patient:  [name] [name], [name] [name]   Account Number:  [id number]  Date Initiated:  06/24/2013  Documentation initiated by:  Lanier Clam  Subjective/Objective Assessment:   79 Y/O F ADMITTED W/LGIB.     Action/Plan:   FROM HOME.HAS PCP,PHARMACY.   Anticipated DC Date:  06/28/2013   Anticipated DC Plan:  HOME/SELF CARE      DC Planning Services  CM consult      Choice offered to / List presented to:             Status of service:  In process, will continue to follow Medicare Important Message given?   (If response is "NO", the following Medicare IM given date fields will be blank) Date Medicare IM given:   Date Additional Medicare IM given:    Discharge Disposition:    Per UR Regulation:  Reviewed for med. necessity/level of care/duration of stay  If discussed at Long Length of Stay Meetings, dates discussed:    Comments:  06/24/13 [facility] RN,BSN NCM [phone]

## 2013-06-24 NOTE — Op Note (Signed)
[facility] [address] [place], [address]   ENDOSCOPY PROCEDURE REPORT  PATIENT: [name], [name]  MR#: [id number] BIRTHDATE: 15-Nov-1922 , 89  yrs. old GENDER: Female ENDOSCOPIST: Roxy Cedar, MD REFERRED BY:  Judy Pimple, M.D. PROCEDURE DATE:  06/24/2013 PROCEDURE:  EGD, diagnostic ASA CLASS:     Class III INDICATIONS:  Melena.  anemia secondary to blood loss MEDICATIONS: Fentanyl 50 mcg IV and Versed 3 mg IV TOPICAL ANESTHETIC: Cetacaine Spray  DESCRIPTION OF PROCEDURE: After the risks benefits and alternatives of the procedure were thoroughly explained, informed consent was obtained.  The PENTAX GASTOROSCOPE [id number] endoscope was introduced through the mouth and advanced to the second portion of the duodenum. Without limitations.  The instrument was slowly withdrawn as the mucosa was fully examined.      Exam: The upper, middle and distal third of the esophagus were carefully inspected and no abnormalities were noted.  The z-line was well seen at the GEJ.  The endoscope was pushed into the fundus which was normal including a retroflexed view.  The antrum, gastric body, first and second part of the duodenum were unremarkable. Retroflexed views revealed a hiatal hernia.     The scope was then withdrawn from the patient and the procedure completed.  COMPLICATIONS: There were no complications. ENDOSCOPIC IMPRESSION: 1. Normal EGD  RECOMMENDATIONS: 1. Colonoscopy tomorrow 2. May need capsule endoscopy if colonoscopy is unrevealing  REPEAT EXAM:  eSigned:  Roxy Cedar, MD 06/24/2013 2:00 PM   XB:JYNWG Fransisca Connors, MD and The Patient

## 2013-06-24 NOTE — Progress Notes (Signed)
[facility] PROGRESS NOTE  [name] [name] ZOX:[id number] DOB: Mar 27, 1923 DOA: 06/23/2013 PCP: Roxy Manns, MD  Brief narrative: 79 y.o. female with PMH of HTN, atrial fibrillation (on Eliquis), CAD and pacemaker who presented to [facility] GI office with complaints of blood in stool and lightheadedness. Patient was then directly admitted to [facility] for further evaluation. Patient reported no loss of consciousness. No diarrhea or constipation. No abdominal pain, nausea or vomiting.  On admission, blood pressure was 92/60 but has improved with IV fluids to 169/70. Heart rate ranged 55- 89 and oxygen saturation was 94% on room air. Hemoglobin on admission was 7.9.  Assessment/Plan:  Principal Problem:   Acute blood loss anemia secondary to GI bleed - In the setting of anticoagulation with Eliquis - Eliquis on hold - plan per GI for EGD today and if negative then colonoscopy in am - Patient has received 2 units of blood since the admission and hemoglobin now 9.2 (from 7.9) - Continue Protonix 40 mg IV every 12 hours Active Problems:   Atrial fibrillation - Eliquis on hold   ANXIETY AND DEPRESSION - contineu zoloft   HYPERTENSION - Restart home medication HCTZ  Code Status: full code Family Communication: no family at the bedside Disposition Plan: home when stable  Manson Passey, MD  [facility] Pager [phone]  If 7PM-7AM, please contact night-coverage [email or web address] Password [email or web address] 06/24/2013, 7:07 AM   LOS: 1 day   Consultants:  GI (Dr. Marina Goodell)  Procedures:  Plan for EGD today  Antibiotics:  None   HPI/Subjective: No overnight events.  Objective: Filed Vitals:   06/24/13 0142 06/24/13 0240 06/24/13 0340 06/24/13 0425  BP: 126/53 169/70 145/71 159/64  Pulse: 55 57 55 56  Temp: 98.2 F (36.8 C) 98 F (36.7 C) 98.3 F (36.8 C) 98 F (36.7 C)  TempSrc: Oral Oral Oral Oral  Resp: 18 18 18 18   Height:      Weight:      SpO2: 97% 95%  95%     Intake/Output Summary (Last 24 hours) at 06/24/13 0707 Last data filed at 06/24/13 0600  Gross per 24 hour  Intake   1400 ml  Output    700 ml  Net    700 ml    Exam:   General:  Pt is alert, follows commands appropriately, not in acute distress  Cardiovascular: Regular rate and rhythm, S1/S2, no murmurs, no rubs, no gallops  Respiratory: Clear to auscultation bilaterally, no wheezing, no crackles, no rhonchi  Abdomen: Soft, non tender, non distended, bowel sounds present, no guarding  Extremities: No edema, pulses DP and PT palpable bilaterally  Neuro: Grossly nonfocal  Data Reviewed: Basic Metabolic Panel:  Recent Labs Lab 06/21/13 1648 06/23/13 1701  NA 140 138  K 3.9 3.3*  CL 104 102  CO2 30 26  GLUCOSE 121* 115*  BUN 30* 32*  CREATININE 1.1 1.03  CALCIUM 9.2 9.6   Liver Function Tests:  Recent Labs Lab 06/21/13 1648 06/23/13 1701  AST 22 17  ALT 12 9  ALKPHOS 51 53  BILITOT 0.4 0.2*  PROT 6.7 6.3  ALBUMIN 4.0 3.7   No results found for this basename: LIPASE, AMYLASE,  in the last 168 hours No results found for this basename: AMMONIA,  in the last 168 hours CBC:  Recent Labs Lab 06/21/13 1648 06/23/13 1552 06/24/13 0516  WBC 8.6 9.2 6.8  NEUTROABS 5.3  --   --   HGB 8.4 Repeated and verified X2.* 7.9* 9.2*  HCT 24.9 Repeated and verified X2.* 23.8* 27.8*  MCV 91.3 91.5 90.0  PLT 225.0 206 157   Cardiac Enzymes: No results found for this basename: CKTOTAL, CKMB, CKMBINDEX, TROPONINI,  in the last 168 hours BNP: No components found with this basename: POCBNP,  CBG: No results found for this basename: GLUCAP,  in the last 168 hours  No results found for this or any previous visit (from the past 240 hour(s)).   Studies: No results found.  Scheduled Meds: . pantoprazole  40 mg Intravenous Q12H  . sertraline  100 mg Oral Daily   Continuous Infusions: . dextrose 5 % and 0.9 % NaCl with KCl 20 mEq/L 50 mL/hr at 06/23/13 1811

## 2013-06-24 NOTE — Progress Notes (Signed)
[facility] Progress Note  Subjective:  No BM since admission.  Received two units PRBC's with just over 1 gram increase in Hgb.  Feels better, however.  Objective:  Vital signs in last 24 hours: Temp:  [98 F (36.7 C)-98.7 F (37.1 C)] 98 F (36.7 C) (10/30 0425) Pulse Rate:  [55-89] 56 (10/30 0425) Resp:  [18-20] 18 (10/30 0425) BP: (92-169)/(47-85) 159/64 mmHg (10/30 0425) SpO2:  [94 %-100 %] 95 % (10/30 0425) Weight:  [152 lb 8 oz (69.174 kg)-153 lb 6.4 oz (69.582 kg)] 153 lb 6.4 oz (69.582 kg) (10/29 1503) Last BM Date: 06/23/13 General:   Alert, Well-developed, in NAD Heart:  Slightly bradycardic with regular rhythm; no murmurs Pulm:  CTAB.  No W/R/R. Abdomen:  Soft, non-tender and non-distended. Normal bowel sounds. Extremities:  Without edema. Neurologic:  Alert and  oriented x4;  grossly normal neurologically. Psych:  Alert and cooperative. Normal mood and affect.  Intake/Output from previous day: 10/29 0701 - 10/30 0700 In: 1400 [I.V.:750; Blood:650] Out: 700 [Urine:700] Intake/Output this shift: Total I/O In: -  Out: 200 [Urine:200]  Lab Results:  Recent Labs  06/21/13 1648 06/23/13 1552 06/24/13 0516  WBC 8.6 9.2 6.8  HGB 8.4 Repeated and verified X2.* 7.9* 9.2*  HCT 24.9 Repeated and verified X2.* 23.8* 27.8*  PLT 225.0 206 157   BMET  Recent Labs  06/21/13 1648 06/23/13 1701  NA 140 138  K 3.9 3.3*  CL 104 102  CO2 30 26  GLUCOSE 121* 115*  BUN 30* 32*  CREATININE 1.1 1.03  CALCIUM 9.2 9.6   LFT  Recent Labs  06/23/13 1701  PROT 6.3  ALBUMIN 3.7  AST 17  ALT 9  ALKPHOS 53  BILITOT 0.2*   PT/INR  Recent Labs  06/23/13 1552  LABPROT 13.3  INR 1.03   Assessment / Plan: #59 79 year old female with slow GI bleed x2 weeks in setting of anticoagulation with Eliquis.  Off anticoagulation x6 days now with continued bleeding (no BM since admission, however).  Suspect upper GI source, rule out peptic ulcer disease.   Cannot rule out a slow lower GI bleed.  #2 Normocytic anemia secondary to acute blood loss, symptomatic but improved since transfusion.  Hgb increased by just over 1 gram after two units of PRBC's. #3 Weakness and lightheadedness secondary to above  #4 Remote history of duodenal ulcer  #5 History of bradycardia status post pacemaker  -Continue IV PPI BID. -EGD today.  Colonoscopy tomorrow if EGD is negative. -Monitor Hgb and transfuse further prn.    LOS: 1 day   ZEHR, JESSICA D.  06/24/2013, 8:55 AM  Pager number [phone]   GI ATTENDING  History interval laboratories reviewed. Patient seen and examined. Plans for EGD today.The nature of the procedure, as well as the risks, benefits, and alternatives were carefully and thoroughly reviewed with the patient. Ample time for discussion and questions allowed. The patient understood, was satisfied, and agreed to proceed.  Wilhemina Bonito. Eda Keys., M.D. [facility] Division of Gastroenterology

## 2013-06-25 ENCOUNTER — Encounter (HOSPITAL_COMMUNITY)
Admission: AD | Disposition: A | Discharge status: Home / Self Care | Payer: Medicare Other | Source: Ambulatory Visit | Attending: Internal Medicine

## 2013-06-25 ENCOUNTER — Encounter (HOSPITAL_COMMUNITY): Payer: Self-pay | Admitting: Internal Medicine

## 2013-06-25 DIAGNOSIS — K5520 Angiodysplasia of colon without hemorrhage: Secondary | ICD-10-CM

## 2013-06-25 DIAGNOSIS — K5521 Angiodysplasia of colon with hemorrhage: Principal | ICD-10-CM

## 2013-06-25 HISTORY — PX: COLONOSCOPY: SHX5424

## 2013-06-25 LAB — CBC
HCT: 31 % — ABNORMAL LOW (ref 36.0–46.0)
Hemoglobin: 10.2 g/dL — ABNORMAL LOW (ref 12.0–15.0)
MCHC: 32.9 g/dL (ref 30.0–36.0)
Platelets: 174 10*3/uL (ref 150–400)
RDW: 16 % — ABNORMAL HIGH (ref 11.5–15.5)

## 2013-06-25 LAB — TYPE AND SCREEN
ABO/RH(D): O POS
Unit division: 0

## 2013-06-25 SURGERY — COLONOSCOPY
Anesthesia: Moderate Sedation

## 2013-06-25 MED ORDER — FENTANYL CITRATE 0.05 MG/ML IJ SOLN
INTRAMUSCULAR | Status: DC | PRN
  Administered 2013-06-25 (×4): 25 ug via INTRAVENOUS

## 2013-06-25 MED ORDER — FENTANYL CITRATE 0.05 MG/ML IJ SOLN
INTRAMUSCULAR | Status: AC
  Filled 2013-06-25: qty 2

## 2013-06-25 MED ORDER — MIDAZOLAM HCL 5 MG/5ML IJ SOLN
INTRAMUSCULAR | Status: DC | PRN
  Administered 2013-06-25: 2 mg via INTRAVENOUS
  Administered 2013-06-25: 1 mg via INTRAVENOUS
  Administered 2013-06-25 (×2): 2 mg via INTRAVENOUS
  Administered 2013-06-25: 1 mg via INTRAVENOUS

## 2013-06-25 MED ORDER — DIPHENHYDRAMINE HCL 50 MG/ML IJ SOLN
INTRAMUSCULAR | Status: AC
  Filled 2013-06-25: qty 1

## 2013-06-25 MED ORDER — SODIUM CHLORIDE 0.9 % IV SOLN
INTRAVENOUS | Status: DC
  Administered 2013-06-25 (×2): 500 mL via INTRAVENOUS

## 2013-06-25 MED ORDER — AMLODIPINE BESYLATE 5 MG PO TABS
5.0000 mg | ORAL_TABLET | Freq: Every day | ORAL | Status: DC
  Administered 2013-06-25 – 2013-06-26 (×2): 5 mg via ORAL
  Filled 2013-06-25 (×2): qty 1

## 2013-06-25 MED ORDER — MIDAZOLAM HCL 10 MG/2ML IJ SOLN
INTRAMUSCULAR | Status: AC
  Filled 2013-06-25: qty 2

## 2013-06-25 NOTE — Progress Notes (Signed)
Report to [facility] on the 4th floor. Pt is stable.

## 2013-06-25 NOTE — Progress Notes (Signed)
[facility] Progress Note  Subjective:  Still drinking her AM prep.  Says that there is no more stool coming out but is having some dark red blood.  Hgb has increased even more today.  Overall she is feeling better.  Objective:  Vital signs in last 24 hours: Temp:  [97.5 F (36.4 C)-98.3 F (36.8 C)] 98.2 F (36.8 C) (10/31 0605) Pulse Rate:  [56-63] 58 (10/31 0605) Resp:  [11-73] 18 (10/31 0605) BP: (110-175)/(38-100) 175/78 mmHg (10/31 0605) SpO2:  [94 %-100 %] 100 % (10/31 0605) Last BM Date: 06/24/13 General:   Alert, Well-developed, in NAD Heart:  Regular rate and rhythm; no murmurs Pulm:  CTAB.  No W/R/R. Abdomen:  Soft, non-distended.  BS present.  Non-tender. Extremities:  Without edema. Neurologic:  Alert and  oriented x4;  grossly normal neurologically. Psych:  Alert and cooperative. Normal mood and affect.  Intake/Output from previous day: 10/30 0701 - 10/31 0700 In: 1491.7 [P.O.:900; I.V.:591.7] Out: 1703 [Urine:1700; Stool:3] Intake/Output this shift: Total I/O In: -  Out: 1 [Stool:1]  Lab Results:  Recent Labs  06/23/13 1552 06/24/13 0516 06/25/13 0804  WBC 9.2 6.8 9.2  HGB 7.9* 9.2* 10.2*  HCT 23.8* 27.8* 31.0*  PLT 206 157 174   BMET  Recent Labs  06/23/13 1701  NA 138  K 3.3*  CL 102  CO2 26  GLUCOSE 115*  BUN 32*  CREATININE 1.03  CALCIUM 9.6   LFT  Recent Labs  06/23/13 1701  PROT 6.3  ALBUMIN 3.7  AST 17  ALT 9  ALKPHOS 53  BILITOT 0.2*   PT/INR  Recent Labs  06/23/13 1552  LABPROT 13.3  INR 1.03   Assessment / Plan: #72 79 year old female with slow GI bleed x2 weeks in setting of anticoagulation with Eliquis.  Normal EGD on 10/30.   #2 Normocytic anemia secondary to acute blood loss, symptomatic.  Improved since transfusion of 2 units PRBC's.   -Colonoscopy later today.  -Monitor Hgb and transfuse further prn.    LOS: 2 days   ZEHR, JESSICA D.  06/25/2013, 8:52 AM  Pager number [phone]   GI  ATTENDING  Agree with above. Colonoscopy later today. Discussed with patient and daughter previously.The nature of the procedure, as well as the risks, benefits, and alternatives were carefully and thoroughly reviewed with the patient. Ample time for discussion and questions allowed. The patient understood, was satisfied, and agreed to proceed.  Wilhemina Bonito. Eda Keys., M.D. [facility] Division of Gastroenterology

## 2013-06-25 NOTE — Progress Notes (Signed)
[facility] PROGRESS NOTE  [name] [name] ZOX:[id number] DOB: 12/13/1922 DOA: 06/23/2013 PCP: Roxy Manns, MD  Brief narrative: 79 y.o. female with PMH of HTN, atrial fibrillation (on Eliquis), CAD and pacemaker who presented to [facility] office with complaints of blood in stool and lightheadedness. Patient was then directly admitted to [facility] for further evaluation. Patient reported no loss of consciousness. No diarrhea or constipation. No abdominal pain, nausea or vomiting.  On admission, blood pressure was 92/60 but has improved with IV fluids to 169/70. Heart rate ranged 55- 89 and oxygen saturation was 94% on room air. Hemoglobin on admission was 7.9. Patient underwent EGD 06/24/2013 which was essentially normal.  Assessment/Plan:   Principal Problem:  Acute blood loss anemia secondary to GI bleed  - In the setting of anticoagulation with Eliquis  - Eliquis on hold  - Patient had EGD done 06/24/2013 with no significant findings. Per GI plan is for colonoscopy today - Patient has received 2 units of blood since the admission. hemoglobin trended up from 7 to 9.2 and subsequently 10.2. - Continue Protonix 40 mg IV every 12 hours  Active Problems:  Atrial fibrillation  - Eliquis on hold  ANXIETY AND DEPRESSION  - contineu zoloft  HYPERTENSION  - BP 175/78; continue Hctz and add Norvasc 5 mg  to this regimen   Code Status: full code  Family Communication: no family at the bedside  Disposition Plan: home when stable   Manson Passey, MD  [facility]  Pager [phone]   Consultants:  GI (Dr. Marina Goodell) Procedures:  EGD 06/24/2013 Colonoscopy 06/25/2013 Antibiotics:  None    If 7PM-7AM, please contact night-coverage [email or web address] Password [facility] 06/25/2013, 6:48 AM   LOS: 2 days    HPI/Subjective: No overnight events.  Objective: Filed Vitals:   06/24/13 1438 06/24/13 1723 06/24/13 2112 06/25/13 0605  BP: 147/70 137/73 162/80 175/78  Pulse: 61  58 58  Temp:  98.1 F (36.7 C)  97.5 F (36.4 C) 98.2 F (36.8 C)  TempSrc: Oral  Oral Oral  Resp: 16  18 18   Height:      Weight:      SpO2: 98%  100% 100%    Intake/Output Summary (Last 24 hours) at 06/25/13 0648 Last data filed at 06/25/13 0000  Gross per 24 hour  Intake 1491.67 ml  Output   1703 ml  Net -211.33 ml    Exam:   General:  Pt is alert, follows commands appropriately, not in acute distress  Cardiovascular: irregular, bradycardic, S1/S2 appreciated  Respiratory: Clear to auscultation bilaterally, no wheezing, no crackles, no rhonchi  Abdomen: Soft, non tender, non distended, bowel sounds present, no guarding  Extremities: No edema, pulses DP and PT palpable bilaterally  Neuro: Grossly nonfocal  Data Reviewed: Basic Metabolic Panel:  Recent Labs Lab 06/21/13 1648 06/23/13 1701  NA 140 138  K 3.9 3.3*  CL 104 102  CO2 30 26  GLUCOSE 121* 115*  BUN 30* 32*  CREATININE 1.1 1.03  CALCIUM 9.2 9.6   Liver Function Tests:  Recent Labs Lab 06/21/13 1648 06/23/13 1701  AST 22 17  ALT 12 9  ALKPHOS 51 53  BILITOT 0.4 0.2*  PROT 6.7 6.3  ALBUMIN 4.0 3.7   No results found for this basename: LIPASE, AMYLASE,  in the last 168 hours No results found for this basename: AMMONIA,  in the last 168 hours CBC:  Recent Labs Lab 06/21/13 1648 06/23/13 1552 06/24/13 0516  WBC 8.6 9.2 6.8  NEUTROABS 5.3  --   --   HGB 8.4  7.9* 9.2*  HCT 24.9 23.8* 27.8*  MCV 91.3 91.5 90.0  PLT 225.0 206 157   Cardiac Enzymes: No results found for this basename: CKTOTAL, CKMB, CKMBINDEX, TROPONINI,  in the last 168 hours BNP: No components found with this basename: POCBNP,  CBG: No results found for this basename: GLUCAP,  in the last 168 hours  No results found for this or any previous visit (from the past 240 hour(s)).   Studies: No results found.  Scheduled Meds: . cholecalciferol  2,000 Units Oral Daily  . hydrochlorothiazide  25 mg Oral Daily  . pantoprazole   40 mg Intravenous Q12H  . peg 3350 powder  0.5 kit Oral Once  . sertraline  100 mg Oral Daily  . sodium chloride  3 mL Intravenous Q12H   Continuous Infusions: . dextrose 5 % and 0.9 % NaCl with KCl 20 mEq/L 50 mL/hr at 06/24/13 1832

## 2013-06-25 NOTE — Op Note (Signed)
[facility] [address] [place], [address]   COLONOSCOPY PROCEDURE REPORT  PATIENT: [name], [name]  MR#: [id number] BIRTHDATE: 1923/01/09 , 89  yrs. old GENDER: Female ENDOSCOPIST: Roxy Cedar, MD REFERRED XB:[id number] Fransisca Connors, M.D. PROCEDURE DATE:  06/25/2013 PROCEDURE:   Colonoscopy with control of bleeding First Screening Colonoscopy - Avg.  risk and is 50 yrs.  old or older - No.  Prior Negative Screening - Now for repeat screening. N/A  History of Adenoma - Now for follow-up colonoscopy & has been > or = to 3 yrs.  N/A  Polyps Removed Today? No.  Recommend repeat exam, <10 yrs? No. ASA CLASS:   Class III INDICATIONS:melena and hematochezia. MEDICATIONS: Fentanyl 100 mcg IV and Versed 8 mg IV  DESCRIPTION OF PROCEDURE:   After the risks benefits and alternatives of the procedure were thoroughly explained, informed consent was obtained.  A digital rectal exam revealed no abnormalities of the rectum.   The     endoscope was introduced through the anus and advanced to the cecum, which was identified by both the appendix and ileocecal valve. No adverse events experienced.   The quality of the prep was excellent, using MoviPrep  The instrument was then slowly withdrawn as the colon was fully examined.  COLON FINDINGS: There was significant sigmoid diverticulosis with stenosis necessitating a change from adult scope to pediatric scope.  Throughout the colon was blood.  Significant blood in the right colon.  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  This was treated with APC successfully.  There were several other nonbleeding AVMs.  The remainder of the colon was unremarkable. Retroflexed views revealed no abnormalities. The time to cecum=5 minutes 0 seconds.  Withdrawal time=10 minutes 0 seconds.  The scope was withdrawn and the procedure completed. COMPLICATIONS: There were no complications.  ENDOSCOPIC IMPRESSION: 1. Active lower GI bleeding  secondary to cecal AVM status post successful APC hemostatic therapy 2. Severe left-sided diverticulosis with sigmoid stenosis. #3. Otherwise normal colonoscopy  RECOMMENDATIONS: 1. Return to the hospital for observation. 2. Recheck CBC in a.m.Marland KitchenMarland KitchenTransfuse as needed 3. Not likely a good candidate for long-term anticoagulation given other AVMs   eSigned:  Roxy Cedar, MD 06/25/2013 2:34 PM  cc: Judy Pimple, MD and The Patient   PATIENT NAME:  [name], [name] MR#: [id number]

## 2013-06-26 DIAGNOSIS — F411 Generalized anxiety disorder: Secondary | ICD-10-CM

## 2013-06-26 LAB — CBC
HCT: 28.4 % — ABNORMAL LOW (ref 36.0–46.0)
Hemoglobin: 9.5 g/dL — ABNORMAL LOW (ref 12.0–15.0)
MCH: 30.9 pg (ref 26.0–34.0)
MCHC: 33.5 g/dL (ref 30.0–36.0)
MCV: 92.5 fL (ref 78.0–100.0)
RBC: 3.07 MIL/uL — ABNORMAL LOW (ref 3.87–5.11)

## 2013-06-26 MED ORDER — OMEPRAZOLE 20 MG PO CPDR: 40.0000 mg | DELAYED_RELEASE_CAPSULE | Freq: Every day | ORAL | Status: DC

## 2013-06-26 NOTE — Progress Notes (Signed)
Cross [facility]  Subjective: Patient seems to be doing well today. Had a small volume BM today with no evidence of melena or hematochezia. She has a good appetite and has tolerated her diet well. Denies having any abdominal pain, nausea or vomiting. Wants to go home. She has had cecal AVM's ablated yesterday and was also found to have sigmoid diverticulosis with stenosis.  Objective: Vital signs in last 24 hours: Temp:  [97.6 F (36.4 C)-98.2 F (36.8 C)] 98.2 F (36.8 C) (11/01 0443) Pulse Rate:  [53-67] 66 (11/01 0443) Resp:  [18-25] 18 (11/01 0443) BP: (90-150)/(46-83) 146/67 mmHg (11/01 1005) SpO2:  [72 %-98 %] 94 % (11/01 0443) Last BM Date: 06/25/13  Intake/Output from previous day: 10/31 0701 - 11/01 0700 In: 840 [P.O.:240; I.V.:600] Out: 3 [Stool:3] Intake/Output this shift: Total I/O In: 360 [P.O.:360] Out: 1 [Stool:1]  General appearance: alert, cooperative, appears stated age, no distress and mildly obese Resp: clear to auscultation bilaterally Cardio: regular rate and rhythm, S1, S2 normal, no murmur, click, rub or gallop GI: soft, non-tender; bowel sounds normal; no masses,  no organomegaly Extremities: extremities normal, atraumatic, no cyanosis or edema  Lab Results:  Recent Labs  06/24/13 0516 06/25/13 0804 06/26/13 0525  WBC 6.8 9.2 8.8  HGB 9.2* 10.2* 9.5*  HCT 27.8* 31.0* 28.4*  PLT 157 174 171   BMET  Recent Labs  06/23/13 1701  NA 138  K 3.3*  CL 102  CO2 26  GLUCOSE 115*  BUN 32*  CREATININE 1.03  CALCIUM 9.6   LFT  Recent Labs  06/23/13 1701  PROT 6.3  ALBUMIN 3.7  AST 17  ALT 9  ALKPHOS 53  BILITOT 0.2*   PT/INR  Recent Labs  06/23/13 1552  LABPROT 13.3  INR 1.03   Medications: I have reviewed the patient's current medications.  Assessment/Plan: 1) S/P GI bleed-cecal AVM's were treated with APC to achieve hemostasis. Hemoglobin stable at 9.5 gm/dl. No anticoagulants are to be restarted now considering her  problems with a recent bleed.  Patient has been advised to see Dr. Sharrell Ku for further recommendations with regards to her arrthymia.    2) Sigmoid diverticulosis.  3) Iron deficiency anemia after a GI bleed; patient has been advised to see her PCP in 1 week or earlier if needed and Dr. Marina Goodell in 2 weeks.   LOS: 3 days   [name] [name] 06/26/2013, 12:06 PM

## 2013-06-26 NOTE — Discharge Summary (Signed)
Physician Discharge Summary  Lyndle Herrlich Graven ZOX:[id number] DOB: [birth year] DOA: 06/23/2013  PCP: Roxy Manns, MD  Admit date: 06/23/2013 Discharge date: 06/26/2013  Recommendations for Outpatient Follow-up:  1. Please follow up with GI per scheduled appointment. Avoid aspirin and NSAIDS. No anticoagulation for now due to high risk of bleed.  Discharge Diagnoses:  Principal Problem:   GI bleed Active Problems:   Acute blood loss anemia   ANXIETY   DEPRESSION   HYPERTENSION   GERD   Atrial fibrillation   Angiodysplasia of cecum   Angiodysplasia of colon with hemorrhage    Discharge Condition: medically stable for discharge home today  Diet recommendation: as tolerated, heart healthy  History of present illness:  79 y.o. female with PMH of HTN, atrial fibrillation (on Eliquis), CAD and pacemaker who presented to [facility] office with complaints of blood in stool and lightheadedness. Patient was then directly admitted to [facility] for further evaluation. Patient reported no loss of consciousness. No diarrhea or constipation. No abdominal pain, nausea or vomiting.  On admission, blood pressure was 92/60 but has improved with IV fluids to 169/70. Heart rate ranged 55- 89 and oxygen saturation was 94% on room air. Hemoglobin on admission was 7.9. Patient underwent EGD 06/24/2013 which was essentially normal.   Assessment/Plan:   Principal Problem:  Acute blood loss anemia secondary to GI bleed  - In the setting of anticoagulation with Eliquis  - Eliquis on hold and instructed the patient to hold on further anticoagulation for now - Patient had EGD done 06/24/2013 with no significant findings. Per colonoscopy she was found to have AVM's  - Patient has received 2 units of blood since the admission. hemoglobin trended up from 7 to 9.2 and subsequently 10.2.  - Continue omeprazole 40 mg a day and pt instructed to follow up with GI in 2 weeks form discharge nad 1 week follow up with  PCP  Active Problems:  Atrial fibrillation  - Eliquis on hold  ANXIETY AND DEPRESSION  - contineu zoloft  HYPERTENSION  - continue home antihypertensives  Code Status: full code  Family Communication: no family at the bedside   Manson Passey, MD  [facility]  Pager [phone]   Consultants:  GI (Dr. Marina Goodell) Procedures:  EGD 06/24/2013  Colonoscopy 06/25/2013 Antibiotics:  None   Signed:  Manson Passey, MD  [facility] 06/26/2013, 2:09 PM  Pager #: [phone]   Discharge Exam: Filed Vitals:   06/26/13 1402  BP: 135/89  Pulse: 76  Temp: 98.8 F (37.1 C)  Resp: 18   Filed Vitals:   06/25/13 2051 06/26/13 0443 06/26/13 1005 06/26/13 1402  BP: 96/54 143/68 146/67 135/89  Pulse: 67 66  76  Temp: 98.1 F (36.7 C) 98.2 F (36.8 C)  98.8 F (37.1 C)  TempSrc: Oral Oral  Oral  Resp: 18 18  18   Height:      Weight:      SpO2: 95% 94%  96%    General: Pt is alert, follows commands appropriately, not in acute distress Cardiovascular: Regular rate and rhythm, S1/S2 +, no murmurs, no rubs, no gallops Respiratory: Clear to auscultation bilaterally, no wheezing, no crackles, no rhonchi Abdominal: Soft, non tender, non distended, bowel sounds +, no guarding Extremities: no edema, no cyanosis, pulses palpable bilaterally DP and PT Neuro: Grossly nonfocal  Discharge Instructions  Discharge Orders   Future Appointments Provider Department Dept Phone   08/02/2013 11:05 AM [facility] Remotes [facility] [place] [employer] [phone]  11/03/2013 8:00 AM [facility] [phone]   11/10/2013 2:30 PM Judy Pimple, MD [facility] [place] [phone]   Future Orders Complete By Expires   Call MD for:  difficulty breathing, headache or visual disturbances  As directed    Call MD for:  persistant dizziness or light-headedness  As directed    Call MD for:  persistant nausea and vomiting  As directed     Call MD for:  severe uncontrolled pain  As directed    Diet - low sodium heart healthy  As directed    Discharge instructions  As directed    Comments:     1. Please follow up with GI per scheduled appointment. Avoid aspirin and NSAIDS. No anticoagulation for now due to high risk of bleed.   Increase activity slowly  As directed        Medication List         cholecalciferol 1000 UNITS tablet  Commonly known as:  VITAMIN D  Take 2,000 Units by mouth daily.     hydrochlorothiazide 25 MG tablet  Commonly known as:  HYDRODIURIL  Take 1 tablet (25 mg total) by mouth daily.     multivitamin tablet  Take 1 tablet by mouth daily.     omeprazole 20 MG capsule  Commonly known as:  PRILOSEC  Take 2 capsules (40 mg total) by mouth daily.     sertraline 100 MG tablet  Commonly known as:  ZOLOFT  Take 1 tablet (100 mg total) by mouth daily.           Follow-up Information   Follow up with Yancey Flemings, MD. Schedule an appointment as soon as possible for a visit in 2 weeks.   Specialty:  Gastroenterology   Contact information:   [address] [place] [address] [phone]       Follow up with Roxy Manns, MD. Schedule an appointment as soon as possible for a visit in 1 week. (recheck hemoglobin)    Specialties:  Family Medicine, Radiology   Contact information:   [address] [place] [address] [place] [address] [phone]       Follow up with Lewayne Bunting, MD. Schedule an appointment as soon as possible for a visit in 1 week. (to see if anticoagulation is required)    Specialty:  Cardiology   Contact information:   [address] [place] [address] [phone]        The results of significant diagnostics from this hospitalization (including imaging, microbiology, ancillary and laboratory) are listed below for reference.    Significant Diagnostic Studies: No results found.  Microbiology: No results found for this  or any previous visit (from the past 240 hour(s)).   Labs: Basic Metabolic Panel:  Recent Labs Lab 06/21/13 1648 06/23/13 1701  NA 140 138  K 3.9 3.3*  CL 104 102  CO2 30 26  GLUCOSE 121* 115*  BUN 30* 32*  CREATININE 1.1 1.03  CALCIUM 9.2 9.6   Liver Function Tests:  Recent Labs Lab 06/21/13 1648 06/23/13 1701  AST 22 17  ALT 12 9  ALKPHOS 51 53  BILITOT 0.4 0.2*  PROT 6.7 6.3  ALBUMIN 4.0 3.7   No results found for this basename: LIPASE, AMYLASE,  in the last 168 hours No results found for this basename: AMMONIA,  in the last 168 hours CBC:  Recent Labs Lab 06/21/13 1648 06/23/13  1552 06/24/13 0516 06/25/13 0804 06/26/13 0525  WBC 8.6 9.2 6.8 9.2 8.8  NEUTROABS 5.3  --   --   --   --   HGB 8.4 Repeated and verified X2.* 7.9* 9.2* 10.2* 9.5*  HCT 24.9 Repeated and verified X2.* 23.8* 27.8* 31.0* 28.4*  MCV 91.3 91.5 90.0 91.4 92.5  PLT 225.0 206 157 174 171   Cardiac Enzymes: No results found for this basename: CKTOTAL, CKMB, CKMBINDEX, TROPONINI,  in the last 168 hours BNP: BNP (last 3 results) No results found for this basename: PROBNP,  in the last 8760 hours CBG: No results found for this basename: GLUCAP,  in the last 168 hours  Time coordinating discharge: Over 30 minutes

## 2013-06-28 ENCOUNTER — Telehealth: Payer: Self-pay | Admitting: Internal Medicine

## 2013-06-28 ENCOUNTER — Encounter (HOSPITAL_COMMUNITY): Payer: Self-pay | Admitting: Internal Medicine

## 2013-06-28 NOTE — Telephone Encounter (Signed)
Spoke with daughter and gave them an appointment with Dr [name] for Jhonnie Garner at 11:45.  She says her Mom is not going to take a blood thinner any longer.  I told her they can discuss at the appointment.  She says she understands her risks and is willing to take them

## 2013-06-28 NOTE — Telephone Encounter (Signed)
New Problem  Daughter states pt was discharged from Hospital// Dr. Marina Goodell has advised that she does not go back onto Eliquis or any blood thinners// Request a call back to discuss.

## 2013-06-29 ENCOUNTER — Encounter: Payer: Self-pay | Admitting: Family Medicine

## 2013-06-29 ENCOUNTER — Ambulatory Visit (INDEPENDENT_AMBULATORY_CARE_PROVIDER_SITE_OTHER): Payer: Medicare Other | Admitting: Family Medicine

## 2013-06-29 VITALS — BP 138/82 | HR 62 | Temp 98.9°F | Ht 62.0 in | Wt 154.0 lb

## 2013-06-29 DIAGNOSIS — I10 Essential (primary) hypertension: Secondary | ICD-10-CM

## 2013-06-29 DIAGNOSIS — D62 Acute posthemorrhagic anemia: Secondary | ICD-10-CM

## 2013-06-29 DIAGNOSIS — K922 Gastrointestinal hemorrhage, unspecified: Secondary | ICD-10-CM

## 2013-06-29 LAB — CBC WITH DIFFERENTIAL/PLATELET
Basophils Relative: 0.4 % (ref 0.0–3.0)
Eosinophils Absolute: 0.2 10*3/uL (ref 0.0–0.7)
Eosinophils Relative: 1.5 % (ref 0.0–5.0)
HCT: 28.1 % — ABNORMAL LOW (ref 36.0–46.0)
Lymphs Abs: 2.1 10*3/uL (ref 0.7–4.0)
MCHC: 34.1 g/dL (ref 30.0–36.0)
MCV: 90 fl (ref 78.0–100.0)
Monocytes Absolute: 1 10*3/uL (ref 0.1–1.0)
Neutrophils Relative %: 71 % (ref 43.0–77.0)
Platelets: 221 10*3/uL (ref 150.0–400.0)
RDW: 15.6 % — ABNORMAL HIGH (ref 11.5–14.6)

## 2013-06-29 NOTE — Patient Instructions (Signed)
I will check in with Dr Marina Goodell to see if we can start you on some iron yet  Follow up with GI and cardiology as planned  If symptoms return (dark stool/ weakness/ abdominal pain- alert [place] right away) Make sure to drink enough fluids Watch blood pressure - if it gets over 140/90 we may need to add back medicine We are re checking cbc for anemia today

## 2013-06-29 NOTE — Progress Notes (Signed)
Subjective:    Patient ID: [name] [name], female    DOB: 1922/12/21, 79 y.o.   MRN: [id number]  HPI Here for f/u of hosp for GI bleed  Pt was hosp at [facility] form 10/29-11/1 for anemia with GI bleed Saw Dr Marina Goodell  EGD neg colonosc showed actively bleeding AVMs tx with hemostatic tx  Transfused 2 u D/C with Hb of 10.2 Lab Results  Component Value Date   WBC 8.8 06/26/2013   HGB 9.5* 06/26/2013   HCT 28.4* 06/26/2013   MCV 92.5 06/26/2013   PLT 171 06/26/2013    Vitals are stabilized  At admit bp was 92/60- this imp quickly with IVF  She is on omeprazole 40 mg daily for 2 weeks-then GI f/u Was prev on eloquis and now holding all anticoag  Her stool is no longer dark in color-is normal as of today   Feels a little weak- but not too bad overall given recent hosp  118/84 bp at home with pulse 79  She is still holding her diovan    Has an IV site to check   Also ST- ? If from the endoscopy - got more sore when she swallowed her zoloft pill  No fever or cold symptoms    She has been eating some bland foods-appetite is better- trying to stick to soft foods  Is a little anxious   Has appt with Dr Graciela Husbands for her f/u for afib - not a good candidate for anticoag   Patient Active Problem List   Diagnosis Date Noted  . Angiodysplasia of cecum 06/25/2013  . Angiodysplasia of colon with hemorrhage 06/25/2013  . GI bleed 06/23/2013  . Acute blood loss anemia 06/23/2013  . Atrial fibrillation 06/06/2011  . ANXIETY 06/16/2007  . DEPRESSION 06/16/2007  . HYPERTENSION 06/16/2007  . GERD 06/16/2007   Past Medical History  Diagnosis Date  . Allergic rhinitis   . Anxiety   . Depression   . GERD (gastroesophageal reflux disease)   . Hypertension   . Osteopenia   . Bradycardia   . Hyperlipidemia   . Duodenal ulcer   . Other specified cardiac dysrhythmias(427.89)    Past Surgical History  Procedure Laterality Date  . Appendectomy    . Total abdominal hysterectomy    .  Tonsillectomy    . Ankle fracture surgery  05/2002  . Insert / replace / remove pacemaker    . Cardiac defibrillator placement    . Esophagogastroduodenoscopy N/A 06/24/2013    Procedure: ESOPHAGOGASTRODUODENOSCOPY (EGD);  Surgeon: Hilarie Fredrickson, MD;  Location: [facility];  Service: Endoscopy;  Laterality: N/A;  . Colonoscopy N/A 06/25/2013    Procedure: COLONOSCOPY;  Surgeon: Hilarie Fredrickson, MD;  Location: [facility];  Service: Endoscopy;  Laterality: N/A;   History  Substance Use Topics  . Smoking status: Never Smoker   . Smokeless tobacco: Never Used  . Alcohol Use: Yes     Comment: infrequently-rare   Family History  Problem Relation Age of Onset  . Heart attack Father   . Depression Mother   . Alzheimer's disease Mother   . Pneumonia Mother   . Heart disease Brother     hx of CABG   Allergies  Allergen Reactions  . Ace Inhibitors     REACTION: cough  . Alendronate Sodium     REACTION: aches  . Avapro [Irbesartan]     hypotension  . Dabigatran Etexilate Mesylate Diarrhea    indigestion  . Nitrofurantoin  REACTION: rash  . Penicillins     REACTION: rash  . Prednisone Other (See Comments)    Extreme weakness   Current Outpatient Prescriptions on File Prior to Visit  Medication Sig Dispense Refill  . cholecalciferol (VITAMIN D) 1000 UNITS tablet Take 2,000 Units by mouth daily.       . hydrochlorothiazide (HYDRODIURIL) 25 MG tablet Take 1 tablet (25 mg total) by mouth daily.  90 tablet  1  . Multiple Vitamin (MULTIVITAMIN) tablet Take 1 tablet by mouth daily.        Marland Kitchen omeprazole (PRILOSEC) 20 MG capsule Take 2 capsules (40 mg total) by mouth daily.  30 capsule  3  . sertraline (ZOLOFT) 100 MG tablet Take 1 tablet (100 mg total) by mouth daily.  90 tablet  0   No current facility-administered medications on file prior to visit.    Review of Systems Review of Systems  Constitutional: Negative for fever, appetite change, fatigue and unexpected weight change.   Eyes: Negative for pain and visual disturbance.  ENt pos for ST Respiratory: Negative for cough and shortness of breath.   Cardiovascular: Negative for cp or palpitations    Gastrointestinal: Negative for nausea, diarrhea and constipation.  Genitourinary: Negative for urgency and frequency.  Skin: Negative for pallor or rash  pos for redness on L arm at IV site Neurological: Negative for weakness, light-headedness, numbness and headaches.  Hematological: Negative for adenopathy. Does not bruise/bleed easily.  Psychiatric/Behavioral: Negative for dysphoric mood. The patient is not nervous/anxious.         Objective:   Physical Exam  Constitutional: She appears well-developed and well-nourished. No distress.  HENT:  Head: Normocephalic and atraumatic.  Mouth/Throat: No oropharyngeal exudate.  Some traumatic erythematous areas at back of throat-presumably from EGD  Eyes: Conjunctivae and EOM are normal. Pupils are equal, round, and reactive to light. No scleral icterus.  Neck: Normal range of motion. Neck supple. No tracheal deviation present. No thyromegaly present.  Cardiovascular: Normal rate and regular rhythm.   Pulmonary/Chest: Effort normal and breath sounds normal.  Abdominal: Soft. Bowel sounds are normal. She exhibits no distension and no mass. There is no tenderness.  Musculoskeletal: She exhibits no edema.  Lymphadenopathy:    She has no cervical adenopathy.  Neurological: She is alert. She has normal reflexes. No cranial nerve deficit. Coordination normal.  Skin: Skin is warm and dry. No rash noted. There is erythema. No pallor.  Erythema over 3-4 cm area of L forearm from IV with tenderness and induration   Psychiatric: She has a normal mood and affect.          Assessment & Plan:

## 2013-06-30 ENCOUNTER — Ambulatory Visit: Payer: Medicare Other | Admitting: Internal Medicine

## 2013-06-30 ENCOUNTER — Encounter: Payer: Medicare Other | Admitting: Internal Medicine

## 2013-06-30 NOTE — Assessment & Plan Note (Signed)
bp ok off diovan currently - after GI bleed and prev hypotension Continue holding it for now and watch bp

## 2013-06-30 NOTE — Assessment & Plan Note (Signed)
From AVM of colon while on eloquis Off blood thinner S/p hosp /transfusion and GI w/u hosp records/ studies rev in detail  Will ask Dr Marina Goodell about iron suppl F/u GI F/u cardiol -now that anti coag is not an option seemingly Cbc today

## 2013-06-30 NOTE — Assessment & Plan Note (Signed)
Re check cbc today Will likely start iron  Continue to monitor

## 2013-07-05 ENCOUNTER — Telehealth: Payer: Self-pay

## 2013-07-05 NOTE — Telephone Encounter (Signed)
Pt advise

## 2013-07-05 NOTE — Telephone Encounter (Signed)
I am comfortable with mucinex DM -to try otc (will help loosen any phlegm if there is any and will also help slow the cough If no improvement-please follow up (or if she worsens)

## 2013-07-05 NOTE — Telephone Encounter (Signed)
No other sxs, no cold sxs, no ST and no fever

## 2013-07-05 NOTE — Telephone Encounter (Signed)
Pt seen 06/29/13; pt had non prod cough for at least 3 weeks;no cold symptoms, no S/T and no fever. Pt has not tried OTC med for cough due to pt having hypertension. [facility] Pt request cb.

## 2013-07-05 NOTE — Telephone Encounter (Signed)
Any other symptoms? Post nasal drip /fever/ st/ ?

## 2013-07-06 ENCOUNTER — Telehealth: Payer: Self-pay

## 2013-07-06 NOTE — Telephone Encounter (Signed)
Pt said she spoke with [facility] and was told would have to wait until 11/27 for refill of omeprazole; Dr Marina Goodell GI  Changed instructions to 2 tabs daily; spoke with Misty Stanley at [facility] and new rx with new instructions from GI can be picked up today after 3 pm. Pt notified and voiced understanding.+

## 2013-07-08 ENCOUNTER — Telehealth: Payer: Self-pay

## 2013-07-08 NOTE — Telephone Encounter (Addendum)
Likely iron related, but given recent hospitalization for melena, do recommend be evaluated tomorrow in office with PCP for black stools.

## 2013-07-08 NOTE — Telephone Encounter (Signed)
Pt was recently in hospital for black stools; has not seen black stools since discharge from hospital 06/26/13 until last night had one black BM and then again today had black BM; has not seen any red blood. Dr Marina Goodell started pt on iron supplement 07/06/13. Advised pt iron supplement can cause black stools but will send note to doctor to advise. No constipation or abd pain; pt feels fine but wanted doctor to be aware. Pt request cb. [facility].

## 2013-07-08 NOTE — Telephone Encounter (Signed)
Noted, thanks!

## 2013-07-08 NOTE — Telephone Encounter (Signed)
Spoke with patient about your recommendations. She doesn't feel that anything is wrong with her and agrees that it is probably the iron. She wants to hold off on an appt for now since she feels fine. She said she has an appt with Dr. Marina Goodell next week. She said she will go to the ER over the weekend if she develops any abd pain, dizziness, noted blood in stool, paleness, diaphoresis or any other symptoms. She said for now she feels absolutely fine/normal.

## 2013-07-10 NOTE — Telephone Encounter (Signed)
Please check in with her Monday to see how she is feeling-thanks

## 2013-07-12 ENCOUNTER — Ambulatory Visit (INDEPENDENT_AMBULATORY_CARE_PROVIDER_SITE_OTHER): Payer: Medicare Other | Admitting: Internal Medicine

## 2013-07-12 ENCOUNTER — Encounter: Payer: Self-pay | Admitting: Internal Medicine

## 2013-07-12 VITALS — BP 140/100 | HR 84 | Temp 98.5°F | Ht 62.0 in | Wt 152.0 lb

## 2013-07-12 DIAGNOSIS — B349 Viral infection, unspecified: Secondary | ICD-10-CM

## 2013-07-12 DIAGNOSIS — R05 Cough: Secondary | ICD-10-CM

## 2013-07-12 DIAGNOSIS — B9789 Other viral agents as the cause of diseases classified elsewhere: Secondary | ICD-10-CM

## 2013-07-12 MED ORDER — PROMETHAZINE-DM 6.25-15 MG/5ML PO SYRP: 5.0000 mL | ORAL_SOLUTION | Freq: Four times a day (QID) | ORAL | Status: DC | PRN

## 2013-07-12 NOTE — Progress Notes (Signed)
Pre-visit discussion using our clinic review tool. No additional management support is needed unless otherwise documented below in the visit note.  

## 2013-07-12 NOTE — Telephone Encounter (Signed)
Left voicemail requesting pt to update [place] on how she is feeling

## 2013-07-12 NOTE — Progress Notes (Signed)
HPI  Pt presents to the clinic today with c/o cough. This started about 2 weeks. The cough is productive of white/clear sputum. She has been taking Mucinex, which has helped. She denies, fever, chills or body aches. She does have a history of allergies. She does not take an daily antihistamine. She did have a recent hospitalization for a bleeding AVM.  Review of Systems      Past Medical History  Diagnosis Date  . Allergic rhinitis   . Anxiety   . Depression   . GERD (gastroesophageal reflux disease)   . Hypertension   . Osteopenia   . Bradycardia   . Hyperlipidemia   . Duodenal ulcer   . Other specified cardiac dysrhythmias(427.89)   . Diverticulosis   . GI bleed   . Atrial fibrillation   . Anemia associated with acute blood loss     Family History  Problem Relation Age of Onset  . Heart attack Father   . Depression Mother   . Alzheimer's disease Mother   . Pneumonia Mother   . Heart disease Brother     hx of CABG    History   Social History  . Marital Status: Married    Spouse Name: N/A    Number of Children: N/A  . Years of Education: N/A   Occupational History  . Not on file.   Social History Main Topics  . Smoking status: Never Smoker   . Smokeless tobacco: Never Used  . Alcohol Use: Yes     Comment: infrequently-rare  . Drug Use: No  . Sexual Activity: Not on file   Other Topics Concern  . Not on file   Social History Narrative  . No narrative on file    Allergies  Allergen Reactions  . Ace Inhibitors     REACTION: cough  . Alendronate Sodium     REACTION: aches  . Avapro [Irbesartan]     hypotension  . Dabigatran Etexilate Mesylate Diarrhea    indigestion  . Nitrofurantoin     REACTION: rash  . Penicillins     REACTION: rash  . Prednisone Other (See Comments)    Extreme weakness     Constitutional:  Denies headache, fatigue, fever or abrupt weight changes.  HEENT:  Positive sore throat. Denies eye redness, eye pain, pressure  behind the eyes, facial pain, nasal congestion, ear pain, ringing in the ears, wax buildup, runny nose or bloody nose. Respiratory: Positive cough. Denies difficulty breathing or shortness of breath.  Cardiovascular: Denies chest pain, chest tightness, palpitations or swelling in the hands or feet.   No other specific complaints in a complete review of systems (except as listed in HPI above).  Objective:   BP 140/100  Pulse 84  Temp(Src) 98.5 F (36.9 C) (Oral)  Ht 5\' 2"  (1.575 m)  Wt 152 lb (68.947 kg)  BMI 27.79 kg/m2  SpO2 96% Wt Readings from Last 3 Encounters:  07/12/13 152 lb (68.947 kg)  06/29/13 154 lb (69.854 kg)  06/23/13 153 lb 6.4 oz (69.582 kg)     General: Appears her stated age, well developed, well nourished in NAD. HEENT: Head: normal shape and size; Eyes: sclera white, no icterus, conjunctiva pink, PERRLA and EOMs intact; Ears: Tm's gray and intact, normal light reflex; Nose: mucosa pink and moist, septum midline; Throat/Mouth: + PND. Teeth present, mucosa erythematous and moist, no exudate noted, no lesions or ulcerations noted.  Neck: Neck supple, trachea midline. No massses, lumps or thyromegaly present.  Cardiovascular: Normal rate and rhythm. S1,S2 noted.  No murmur, rubs or gallops noted. No JVD or BLE edema. No carotid bruits noted. Pulmonary/Chest: Normal effort and positive vesicular breath sounds. No respiratory distress. No wheezes, rales or ronchi noted.      Assessment & Plan:   Cough, likely viral:  Get some rest and drink plenty of water Do salt water gargles for the sore throat eRx for promethazine-dextromethorphan cough syrup Watch for fever, colored mucous, or worsening symptoms  RTC as needed or if symptoms persist.

## 2013-07-12 NOTE — Patient Instructions (Signed)

## 2013-07-13 NOTE — Telephone Encounter (Signed)
Pt said she is feeling okay but still having black stool and has been having them since starting the iron but she has a f/u appt with he GI doc tomorrow so she will let them know what's going on

## 2013-07-13 NOTE — Telephone Encounter (Signed)
Ok -follow up as planned

## 2013-07-14 ENCOUNTER — Ambulatory Visit (INDEPENDENT_AMBULATORY_CARE_PROVIDER_SITE_OTHER): Payer: Medicare Other | Admitting: Internal Medicine

## 2013-07-14 ENCOUNTER — Encounter: Payer: Self-pay | Admitting: Internal Medicine

## 2013-07-14 VITALS — BP 174/102 | HR 88 | Ht 62.0 in | Wt 149.4 lb

## 2013-07-14 DIAGNOSIS — K219 Gastro-esophageal reflux disease without esophagitis: Secondary | ICD-10-CM

## 2013-07-14 DIAGNOSIS — D62 Acute posthemorrhagic anemia: Secondary | ICD-10-CM

## 2013-07-14 DIAGNOSIS — D509 Iron deficiency anemia, unspecified: Secondary | ICD-10-CM

## 2013-07-14 DIAGNOSIS — K5521 Angiodysplasia of colon with hemorrhage: Secondary | ICD-10-CM

## 2013-07-14 DIAGNOSIS — K922 Gastrointestinal hemorrhage, unspecified: Secondary | ICD-10-CM

## 2013-07-14 MED ORDER — OMEPRAZOLE 20 MG PO CPDR: 20.0000 mg | DELAYED_RELEASE_CAPSULE | Freq: Every day | ORAL | Status: DC

## 2013-07-14 MED ORDER — FERROUS SULFATE 325 (65 FE) MG PO TABS: 325.0000 mg | ORAL_TABLET | Freq: Two times a day (BID) | ORAL | Status: DC

## 2013-07-14 NOTE — Progress Notes (Signed)
HISTORY OF PRESENT ILLNESS:  [name] [name] is a 79 y.o. female with multiple significant medical problems who was seen in this office by the physician assistant 06/23/2013 with progressive anemia, Hemoccult-positive stool, and subacute overt GI bleeding. She was hospitalized that day. On 06/24/2013 she underwent upper endoscopy. This was normal. She does stay on PPI chronically for GERD. 06/25/2013 she underwent complete colonoscopy. 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 Hemostasis achieved. Admission hemoglobin was 7.0. Discharge hemoglobin after 2 units of blood was 10.2. Last hemoglobin November 4 was 9.6. The patient has been on iron supplement once daily. For reasons that are unclear PPI dosage was increased from 20 mg daily to 40 mg daily. She reports slightly increased frequency of bowel movements, but no diarrhea. She is tolerating iron well. Her only complaint is nonspecific fatigue. She had previously been on Eliquis for atrial fibrillation, but this was discontinued.  REVIEW OF SYSTEMS:  All non-GI ROS negative except for fatigue  Past Medical History  Diagnosis Date  . Allergic rhinitis   . Anxiety   . Depression   . GERD (gastroesophageal reflux disease)   . Hypertension   . Osteopenia   . Bradycardia   . Hyperlipidemia   . Duodenal ulcer   . Other specified cardiac dysrhythmias(427.89)   . Diverticulosis   . GI bleed   . Atrial fibrillation   . Anemia associated with acute blood loss     Past Surgical History  Procedure Laterality Date  . Appendectomy    . Total abdominal hysterectomy    . Tonsillectomy    . Ankle fracture surgery  05/2002  . Insert / replace / remove pacemaker    . Cardiac defibrillator placement    . Esophagogastroduodenoscopy N/A 06/24/2013    Procedure: ESOPHAGOGASTRODUODENOSCOPY (EGD);  Surgeon: Hilarie Fredrickson, MD;  Location: [facility];  Service: Endoscopy;  Laterality: N/A;  . Colonoscopy N/A  06/25/2013    Procedure: COLONOSCOPY;  Surgeon: Hilarie Fredrickson, MD;  Location: [facility];  Service: Endoscopy;  Laterality: N/A;    Social History [place] [name] [name]  reports that she has never smoked. She has never used smokeless tobacco. She reports that she drinks alcohol. She reports that she does not use illicit drugs.  family history includes Alzheimer's disease in her mother; Depression in her mother; Heart attack in her father; Heart disease in her brother; Pneumonia in her mother.  Allergies  Allergen Reactions  . Ace Inhibitors     REACTION: cough  . Alendronate Sodium     REACTION: aches  . Avapro [Irbesartan]     hypotension  . Dabigatran Etexilate Mesylate Diarrhea    indigestion  . Nitrofurantoin     REACTION: rash  . Penicillins     REACTION: rash  . Prednisone Other (See Comments)    Extreme weakness       PHYSICAL EXAMINATION: Vital signs: BP 174/102  Pulse 88  Ht 5\' 2"  (1.575 m)  Wt 149 lb 6 oz (67.756 kg)  BMI 27.31 kg/m2 General: Well-developed, well-nourished, no acute distress HEENT: Sclerae are anicteric, conjunctiva pink. Oral mucosa intact Lungs: Clear Heart: Regular Abdomen: soft, nontender, nondistended, no obvious ascites, no peritoneal signs, normal bowel sounds. No organomegaly. Extremities: No edema Psychiatric: alert and oriented x3. Cooperative   ASSESSMENT:  #1. 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 GERD. Asymptomatic on PPI. Recent EGD normal. #3. Multiple  medical problems #4. Posthemorrhagic anemia   PLAN:  #1. Decrease omeprazole to 20 mg daily #2. Increase iron therapy to twice daily given ongoing anemia. The patient should stay on iron INDEFINITELY given a history of AVMs. These patients are prone to chronic low-grade GI blood loss with resultant iron deficiency. #3. Resume general medical care with Dr. Milinda Antis. Dr. Milinda Antis can monitor the patient's  hemoglobin periodically. GI followup as needed.

## 2013-07-14 NOTE — Patient Instructions (Addendum)
Decrease your Omeprazole to 20mg  daily (one capsule per day)  Increase your Iron to 2 capsules per day  Follow up with your primary care doctor for periodic blood checks.  Please follow up with Dr. Marina Goodell as needed

## 2013-07-20 ENCOUNTER — Encounter: Payer: Self-pay | Admitting: Internal Medicine

## 2013-07-20 ENCOUNTER — Ambulatory Visit (INDEPENDENT_AMBULATORY_CARE_PROVIDER_SITE_OTHER): Payer: Medicare Other | Admitting: Internal Medicine

## 2013-07-20 VITALS — BP 159/96 | HR 81 | Ht 62.0 in | Wt 150.0 lb

## 2013-07-20 DIAGNOSIS — I498 Other specified cardiac arrhythmias: Secondary | ICD-10-CM

## 2013-07-20 DIAGNOSIS — I10 Essential (primary) hypertension: Secondary | ICD-10-CM

## 2013-07-20 DIAGNOSIS — I4891 Unspecified atrial fibrillation: Secondary | ICD-10-CM

## 2013-07-20 DIAGNOSIS — Z45018 Encounter for adjustment and management of other part of cardiac pacemaker: Secondary | ICD-10-CM

## 2013-07-20 DIAGNOSIS — K5521 Angiodysplasia of colon with hemorrhage: Secondary | ICD-10-CM

## 2013-07-20 LAB — MDC_IDC_ENUM_SESS_TYPE_INCLINIC
Battery Impedance: 904 Ohm
Battery Remaining Longevity: 64 mo
Battery Voltage: 2.78 V
Date Time Interrogation Session: 20141125123558
Lead Channel Impedance Value: 403 Ohm
Lead Channel Pacing Threshold Amplitude: 0.75 V
Lead Channel Pacing Threshold Amplitude: 0.75 V
Lead Channel Pacing Threshold Pulse Width: 0.4 ms
Lead Channel Pacing Threshold Pulse Width: 0.4 ms
Lead Channel Sensing Intrinsic Amplitude: 8 mV
Lead Channel Setting Pacing Amplitude: 1.75 V
Lead Channel Setting Pacing Amplitude: 2 V
Lead Channel Setting Pacing Pulse Width: 0.4 ms
Lead Channel Setting Sensing Sensitivity: 2.8 mV

## 2013-07-20 NOTE — Assessment & Plan Note (Signed)
The patient has had significant bleeding; it is currently felt by GI she is not a candidate for anticoagulation. Her CHADS-VASc score is 4 and we'll need to follow this along. Thankfully she has had no intercurrent atrial fibrillation for whatever benefit that may show

## 2013-07-20 NOTE — Progress Notes (Signed)
Patient Care Team: Judy Pimple, MD as PCP - General   HPI  [name] [name] is a 79 y.o. female Seen in followup for pacemaker implantation for tachybradycardia syndrome. She has a history of paroxysmal atrial fibrillation. She has been intolerant of multiple anticoagulants; she was on apixaban.  She has had intercurrent hospitalization for GI bleeding. Anticoagulation was discontinued. Her antihypertensives were also put on hold. She is feeling much better.   Past Medical History  Diagnosis Date  . Allergic rhinitis   . Anxiety   . Depression   . GERD (gastroesophageal reflux disease)   . Hypertension   . Osteopenia   . Bradycardia   . Hyperlipidemia   . Duodenal ulcer   . Other specified cardiac dysrhythmias(427.89)   . Diverticulosis   . GI bleed   . Atrial fibrillation   . Anemia associated with acute blood loss   . Diverticulosis     Past Surgical History  Procedure Laterality Date  . Appendectomy    . Total abdominal hysterectomy    . Tonsillectomy    . Ankle fracture surgery  05/2002  . Insert / replace / remove pacemaker    . Cardiac defibrillator placement    . Esophagogastroduodenoscopy N/A 06/24/2013    Procedure: ESOPHAGOGASTRODUODENOSCOPY (EGD);  Surgeon: Hilarie Fredrickson, MD;  Location: [facility];  Service: Endoscopy;  Laterality: N/A;  . Colonoscopy N/A 06/25/2013    Procedure: COLONOSCOPY;  Surgeon: Hilarie Fredrickson, MD;  Location: [facility];  Service: Endoscopy;  Laterality: N/A;    Current Outpatient Prescriptions  Medication Sig Dispense Refill  . cholecalciferol (VITAMIN D) 1000 UNITS tablet Take 2,000 Units by mouth daily.       . ferrous sulfate 325 (65 FE) MG tablet Take 1 tablet (325 mg total) by mouth 2 (two) times daily.  60 tablet  3  . hydrochlorothiazide (HYDRODIURIL) 25 MG tablet Take 1 tablet (25 mg total) by mouth daily.  90 tablet  1  . Multiple Vitamin (MULTIVITAMIN) tablet Take 1 tablet by mouth daily.        Marland Kitchen  omeprazole (PRILOSEC) 20 MG capsule Take 1 capsule (20 mg total) by mouth daily.  30 capsule  11  . promethazine-dextromethorphan (PROMETHAZINE-DM) 6.25-15 MG/5ML syrup Take 5 mLs by mouth 4 (four) times daily as needed for cough.  118 mL  0  . sertraline (ZOLOFT) 100 MG tablet Take 1 tablet (100 mg total) by mouth daily.  90 tablet  0   No current facility-administered medications for this visit.    Allergies  Allergen Reactions  . Ace Inhibitors     REACTION: cough  . Alendronate Sodium     REACTION: aches  . Avapro [Irbesartan]     hypotension  . Dabigatran Etexilate Mesylate Diarrhea    indigestion  . Nitrofurantoin     REACTION: rash  . Penicillins     REACTION: rash  . Prednisone Other (See Comments)    Extreme weakness    Review of Systems negative except from HPI and PMH  Physical Exam BP 159/96  Pulse 81  Ht 5\' 2"  (1.575 m)  Wt 150 lb (68.04 kg)  BMI 27.43 kg/m2 Device pocket well healed; without hematoma or erythema.  There is no tethering  Well developed and nourished in no acute distress HENT normal Neck supple with JVP-flat Clear Regular rate and rhythm, no murmurs or gallops Abd-soft with active BS No Clubbing cyanosis edema Skin-warm and dry A & Oriented  Grossly normal sensory and motor function     Assessment and  Plan

## 2013-07-20 NOTE — Patient Instructions (Signed)
Your physician recommends that you schedule a follow-up appointment in: 4 weeks with Dr. Milinda Antis.  If your blood pressure is > 150 on the top number at your blood draw on 12/3, then restart metoprolol.  Remote monitoring is used to monitor your Pacemaker of ICD from home. This monitoring reduces the number of office visits required to check your device to one time per year. It allows [place] to keep an eye on the functioning of your device to ensure it is working properly. You are scheduled for a device check from home on 10/21/13. You may send your transmission at any time that day. If you have a wireless device, the transmission will be sent automatically. After your physician reviews your transmission, you will receive a postcard with your next transmission date.  Your physician wants you to follow-up in: 1 year with Dr. Graciela Husbands. You will receive a reminder letter in the mail two months in advance. If you don't receive a letter, please call our office to schedule the follow-up appointment.

## 2013-07-20 NOTE — Assessment & Plan Note (Signed)
The patient's device was interrogated and the information was fully reviewed.  The device was reprogrammed to  Increase in lower rate limit from 55-60

## 2013-07-20 NOTE — Assessment & Plan Note (Signed)
Patient's blood pressure is increasing again probably in the wake of her GI bleed. I suggested that when she gets her blood checked next week if her systolic is greater than 150 she should resume her metoprolol. We'll also change her followup appointment with Dr. Lucretia Roers for improved followup

## 2013-07-20 NOTE — Assessment & Plan Note (Signed)
No intercurrent atrial fibrillation,  As above

## 2013-07-21 ENCOUNTER — Other Ambulatory Visit: Payer: Self-pay | Admitting: Family Medicine

## 2013-07-21 DIAGNOSIS — D649 Anemia, unspecified: Secondary | ICD-10-CM

## 2013-07-23 ENCOUNTER — Other Ambulatory Visit: Payer: Self-pay | Admitting: Internal Medicine

## 2013-07-23 NOTE — Telephone Encounter (Signed)
Rx called in and pt advised  

## 2013-07-23 NOTE — Telephone Encounter (Signed)
Pt's daughter called for pt, she has been taking cough med for 2 weeks, she is now out of med, still coughing, and wants a refill of the med. Pt's daughter request pt be notified when rx is called in.

## 2013-07-23 NOTE — Telephone Encounter (Signed)
Px written for call in   F/u next week if not improving

## 2013-07-25 DIAGNOSIS — K922 Gastrointestinal hemorrhage, unspecified: Secondary | ICD-10-CM

## 2013-07-25 DIAGNOSIS — D62 Acute posthemorrhagic anemia: Secondary | ICD-10-CM

## 2013-07-28 ENCOUNTER — Other Ambulatory Visit (INDEPENDENT_AMBULATORY_CARE_PROVIDER_SITE_OTHER): Payer: Medicare Other

## 2013-07-28 DIAGNOSIS — D649 Anemia, unspecified: Secondary | ICD-10-CM

## 2013-07-28 LAB — CBC WITH DIFFERENTIAL/PLATELET
Basophils Absolute: 0.1 10*3/uL (ref 0.0–0.1)
Basophils Relative: 0.7 % (ref 0.0–3.0)
Eosinophils Absolute: 0.2 10*3/uL (ref 0.0–0.7)
Eosinophils Relative: 2.4 % (ref 0.0–5.0)
HCT: 31.8 % — ABNORMAL LOW (ref 36.0–46.0)
Hemoglobin: 10.6 g/dL — ABNORMAL LOW (ref 12.0–15.0)
Lymphs Abs: 2.9 10*3/uL (ref 0.7–4.0)
MCHC: 33.2 g/dL (ref 30.0–36.0)
MCV: 89.7 fl (ref 78.0–100.0)
Monocytes Absolute: 0.7 10*3/uL (ref 0.1–1.0)
Neutro Abs: 4.4 10*3/uL (ref 1.4–7.7)
Platelets: 267 10*3/uL (ref 150.0–400.0)
RBC: 3.55 Mil/uL — ABNORMAL LOW (ref 3.87–5.11)
WBC: 8.3 10*3/uL (ref 4.5–10.5)

## 2013-07-30 ENCOUNTER — Encounter: Payer: Self-pay | Admitting: Family Medicine

## 2013-07-30 ENCOUNTER — Ambulatory Visit (INDEPENDENT_AMBULATORY_CARE_PROVIDER_SITE_OTHER): Payer: Medicare Other | Admitting: Family Medicine

## 2013-07-30 VITALS — BP 120/80 | HR 87 | Temp 98.3°F | Ht 62.5 in | Wt 152.0 lb

## 2013-07-30 DIAGNOSIS — J209 Acute bronchitis, unspecified: Secondary | ICD-10-CM

## 2013-07-30 DIAGNOSIS — I10 Essential (primary) hypertension: Secondary | ICD-10-CM

## 2013-07-30 DIAGNOSIS — K5521 Angiodysplasia of colon with hemorrhage: Secondary | ICD-10-CM

## 2013-07-30 MED ORDER — PROMETHAZINE-DM 6.25-15 MG/5ML PO SYRP: 5.0000 mL | ORAL_SOLUTION | Freq: Four times a day (QID) | ORAL | Status: DC | PRN

## 2013-07-30 MED ORDER — AZITHROMYCIN 250 MG PO TABS: ORAL_TABLET | ORAL | Status: DC

## 2013-07-30 NOTE — Progress Notes (Signed)
Subjective:    Patient ID: [name] [name], female    DOB: Jun 21, 1923, 79 y.o.   MRN: [id number]  HPI Here for cough  Going on for a month  Productive - with mucous that is white / and less than it was last week  Today it is a little improved Also post nasal drip and congestion  Her nasal congestion is mostly clear and occasionally thick  Saw NP here mid month and dx with viral uri -sympt care- promethaine cough syrup  Tried promethazine DM- px  mucinex did not really help   No sinus pain   No fever  No cp or wheezing   Saw Dr Graciela Husbands - her bp was high  BP Readings from Last 3 Encounters:  07/30/13 116/78  07/20/13 159/96  07/14/13 174/102   is much better now  Feels fine  No ha or other symptoms   Lab Results  Component Value Date   WBC 8.3 07/28/2013   HGB 10.6* 07/28/2013   HCT 31.8* 07/28/2013   MCV 89.7 07/28/2013   PLT 267.0 07/28/2013   anemia is gradually improving with iron after a GI bleed No longer on a blood thinner   Feeling better physically and emotionally and also cut her zoloft dose - pretty happy    Patient Active Problem List   Diagnosis Date Noted  . Pacemaker -Medtronic 07/20/2013  . Angiodysplasia of colon with hemorrhage 06/25/2013  . Atrial fibrillation 06/06/2011  . ANXIETY 06/16/2007  . DEPRESSION 06/16/2007  . HYPERTENSION 06/16/2007  . GERD 06/16/2007   Past Medical History  Diagnosis Date  . Allergic rhinitis   . Anxiety   . Depression   . GERD (gastroesophageal reflux disease)   . Hypertension   . Osteopenia   . Bradycardia   . Hyperlipidemia   . Duodenal ulcer   . Other specified cardiac dysrhythmias(427.89)   . Diverticulosis   . GI bleed   . Atrial fibrillation   . Anemia associated with acute blood loss   . Diverticulosis    Past Surgical History  Procedure Laterality Date  . Appendectomy    . Total abdominal hysterectomy    . Tonsillectomy    . Ankle fracture surgery  05/2002  . Insert / replace /  remove pacemaker    . Cardiac defibrillator placement    . Esophagogastroduodenoscopy N/A 06/24/2013    Procedure: ESOPHAGOGASTRODUODENOSCOPY (EGD);  Surgeon: Hilarie Fredrickson, MD;  Location: [facility];  Service: Endoscopy;  Laterality: N/A;  . Colonoscopy N/A 06/25/2013    Procedure: COLONOSCOPY;  Surgeon: Hilarie Fredrickson, MD;  Location: [facility];  Service: Endoscopy;  Laterality: N/A;   History  Substance Use Topics  . Smoking status: Never Smoker   . Smokeless tobacco: Never Used  . Alcohol Use: Yes     Comment: infrequently-rare   Family History  Problem Relation Age of Onset  . Heart attack Father   . Depression Mother   . Alzheimer's disease Mother   . Pneumonia Mother   . Heart disease Brother     hx of CABG   Allergies  Allergen Reactions  . Ace Inhibitors     REACTION: cough  . Alendronate Sodium     REACTION: aches  . Avapro [Irbesartan]     hypotension  . Dabigatran Etexilate Mesylate Diarrhea    indigestion  . Nitrofurantoin     REACTION: rash  . Penicillins     REACTION: rash  . Prednisone Other (See Comments)  Extreme weakness   Current Outpatient Prescriptions on File Prior to Visit  Medication Sig Dispense Refill  . cholecalciferol (VITAMIN D) 1000 UNITS tablet Take 2,000 Units by mouth daily.       . ferrous sulfate 325 (65 FE) MG tablet Take 1 tablet (325 mg total) by mouth 2 (two) times daily.  60 tablet  3  . hydrochlorothiazide (HYDRODIURIL) 25 MG tablet Take 1 tablet (25 mg total) by mouth daily.  90 tablet  1  . Multiple Vitamin (MULTIVITAMIN) tablet Take 1 tablet by mouth daily.        Marland Kitchen omeprazole (PRILOSEC) 20 MG capsule Take 1 capsule (20 mg total) by mouth daily.  30 capsule  11  . promethazine-dextromethorphan (PROMETHAZINE-DM) 6.25-15 MG/5ML syrup TAKE 1 TEASPOONFUL BY MOUTH 4 TIMES A DAY AS NEEDED FOR COUGH  118 mL  0  . sertraline (ZOLOFT) 100 MG tablet Take 1 tablet (100 mg total) by mouth daily.  90 tablet  0   No current  facility-administered medications on file prior to visit.    Review of Systems Review of Systems  Constitutional: Negative for fever, appetite change, fatigue and unexpected weight change.  Eyes: Negative for pain and visual disturbance.  ENt pos for post nasal drip and neg for ear pain or sinus pain Respiratory: Negative for shortness of breath.   Cardiovascular: Negative for cp or palpitations    Gastrointestinal: Negative for nausea, diarrhea and constipation.  Genitourinary: Negative for urgency and frequency.  Skin: Negative for pallor or rash   Neurological: Negative for weakness, light-headedness, numbness and headaches.  Hematological: Negative for adenopathy. Does not bruise/bleed easily.  Psychiatric/Behavioral: Negative for dysphoric mood. The patient is not nervous/anxious.         Objective:   Physical Exam  Constitutional: She appears well-developed and well-nourished. No distress.  HENT:  Head: Normocephalic and atraumatic.  Right Ear: External ear normal.  Left Ear: External ear normal.  Mouth/Throat: No oropharyngeal exudate.  Nares are injected and boggy Clear rhinorrhea No sinus tenderness  Eyes: Conjunctivae and EOM are normal. Pupils are equal, round, and reactive to light. Right eye exhibits no discharge. Left eye exhibits no discharge.  Neck: Normal range of motion. Neck supple.  Cardiovascular: Normal rate and regular rhythm.   Pulmonary/Chest: Effort normal and breath sounds normal. No respiratory distress. She has no wheezes. She has no rales. She exhibits no tenderness.  Few isolated rhonchi/ some upper airway sounds Wet sounding cough No rales   Lymphadenopathy:    She has no cervical adenopathy.  Neurological: She is alert. She has normal reflexes.  Skin: Skin is warm and dry. No rash noted.          Assessment & Plan:

## 2013-07-30 NOTE — Patient Instructions (Signed)
I think you have mild bronchitis Take the zithromax as directed I refilled cough medicine as well -sent to your pharmacy Keep up a good water intake  Your blood pressure is much better today- we will continue to watch it -- please continue to check at home (always check it when you are relaxed) Blood count (anemia is improved)- continue iron  Follow up with me in March- or earlier if needed  Update if not starting to improve in a week or if worsening

## 2013-07-30 NOTE — Progress Notes (Signed)
Pre-visit discussion using our clinic review tool. No additional management support is needed unless otherwise documented below in the visit note.  

## 2013-08-01 NOTE — Assessment & Plan Note (Signed)
Cover with zithromax Disc symptomatic care - see instructions on AVS - with cough med  Update if not starting to improve in a week or if worsening

## 2013-08-01 NOTE — Assessment & Plan Note (Signed)
Anemia improving with iron- will continue that and follow

## 2013-08-01 NOTE — Assessment & Plan Note (Signed)
BP: 120/80 mmHg  bp in fair control at this time  No changes needed Disc lifstyle change with low sodium diet and exercise   Much improved now - will follow closely

## 2013-08-02 ENCOUNTER — Encounter: Payer: Self-pay | Admitting: Internal Medicine

## 2013-08-17 ENCOUNTER — Ambulatory Visit: Payer: Medicare Other | Admitting: Family Medicine

## 2013-10-21 ENCOUNTER — Encounter: Payer: Medicare Other | Admitting: *Deleted

## 2013-10-27 ENCOUNTER — Emergency Department: Payer: Self-pay | Admitting: Emergency Medicine

## 2013-10-31 ENCOUNTER — Telehealth: Payer: Self-pay | Admitting: Family Medicine

## 2013-10-31 DIAGNOSIS — I10 Essential (primary) hypertension: Secondary | ICD-10-CM

## 2013-10-31 NOTE — Telephone Encounter (Signed)
Message copied by Judy PimpleWER, Brendan Gruwell A on Sun Oct 31, 2013  3:32 PM ------      Message from: Baldomero LamyHAVERS, NATASHA C      Created: Fri Oct 29, 2013 12:31 PM      Regarding: Cpx labs Mon 11/01/13       Please order  future cpx labs for pt's upcoming lab appt.      Thanks      Tasha       ------

## 2013-11-01 ENCOUNTER — Other Ambulatory Visit (INDEPENDENT_AMBULATORY_CARE_PROVIDER_SITE_OTHER): Payer: Commercial Managed Care - HMO

## 2013-11-01 ENCOUNTER — Encounter: Payer: Self-pay | Admitting: Family Medicine

## 2013-11-01 ENCOUNTER — Ambulatory Visit (INDEPENDENT_AMBULATORY_CARE_PROVIDER_SITE_OTHER): Payer: Commercial Managed Care - HMO | Admitting: Family Medicine

## 2013-11-01 VITALS — BP 130/92 | HR 86 | Temp 98.8°F | Ht 62.5 in | Wt 147.8 lb

## 2013-11-01 DIAGNOSIS — T148XXA Other injury of unspecified body region, initial encounter: Secondary | ICD-10-CM

## 2013-11-01 DIAGNOSIS — I10 Essential (primary) hypertension: Secondary | ICD-10-CM

## 2013-11-01 DIAGNOSIS — W010XXA Fall on same level from slipping, tripping and stumbling without subsequent striking against object, initial encounter: Secondary | ICD-10-CM

## 2013-11-01 LAB — COMPREHENSIVE METABOLIC PANEL
ALBUMIN: 4.7 g/dL (ref 3.5–5.2)
ALK PHOS: 60 U/L (ref 39–117)
ALT: 19 U/L (ref 0–35)
AST: 26 U/L (ref 0–37)
BUN: 18 mg/dL (ref 6–23)
CHLORIDE: 97 meq/L (ref 96–112)
CO2: 33 meq/L — AB (ref 19–32)
Calcium: 10 mg/dL (ref 8.4–10.5)
Creatinine, Ser: 0.9 mg/dL (ref 0.4–1.2)
GFR: 64.41 mL/min (ref >60.00)
Glucose, Bld: 92 mg/dL (ref 70–99)
Potassium: 4 mEq/L (ref 3.5–5.1)
SODIUM: 137 meq/L (ref 135–145)
Total Bilirubin: 0.7 mg/dL (ref 0.3–1.2)
Total Protein: 7.8 g/dL (ref 6.0–8.3)

## 2013-11-01 LAB — CBC WITH DIFFERENTIAL/PLATELET
Basophils Absolute: 0 K/uL (ref 0.0–0.1)
Basophils Relative: 0.7 % (ref 0.0–3.0)
Eosinophils Absolute: 0.3 K/uL (ref 0.0–0.7)
Eosinophils Relative: 4 % (ref 0.0–5.0)
HCT: 37.4 % (ref 36.0–46.0)
Hemoglobin: 12.6 g/dL (ref 12.0–15.0)
Lymphocytes Relative: 26.9 % (ref 12.0–46.0)
Lymphs Abs: 2 K/uL (ref 0.7–4.0)
MCHC: 33.8 g/dL (ref 30.0–36.0)
MCV: 89.1 fl (ref 78.0–100.0)
Monocytes Absolute: 0.7 K/uL (ref 0.1–1.0)
Monocytes Relative: 9.3 % (ref 3.0–12.0)
Neutro Abs: 4.3 K/uL (ref 1.4–7.7)
Neutrophils Relative %: 59.1 % (ref 43.0–77.0)
Platelets: 235 K/uL (ref 150.0–400.0)
RBC: 4.2 Mil/uL (ref 3.87–5.11)
RDW: 16.3 % — ABNORMAL HIGH (ref 11.5–14.6)
WBC: 7.3 K/uL (ref 4.5–10.5)

## 2013-11-01 LAB — LIPID PANEL
Cholesterol: 191 mg/dL (ref 0–200)
HDL: 52.2 mg/dL
LDL Cholesterol: 114 mg/dL — ABNORMAL HIGH (ref 0–99)
Total CHOL/HDL Ratio: 4
Triglycerides: 125 mg/dL (ref 0.0–149.0)
VLDL: 25 mg/dL (ref 0.0–40.0)

## 2013-11-01 LAB — TSH: TSH: 3.39 u[IU]/mL (ref 0.35–5.50)

## 2013-11-01 MED ORDER — HYDROCHLOROTHIAZIDE 25 MG PO TABS: 25.0000 mg | ORAL_TABLET | Freq: Every day | ORAL | Status: DC

## 2013-11-01 MED ORDER — METOPROLOL TARTRATE 25 MG PO TABS: 25.0000 mg | ORAL_TABLET | Freq: Two times a day (BID) | ORAL | Status: DC

## 2013-11-01 NOTE — Progress Notes (Signed)
Pre visit review using our clinic review tool, if applicable. No additional management support is needed unless otherwise documented below in the visit note. 

## 2013-11-01 NOTE — Assessment & Plan Note (Signed)
S/p stumble and fall  L breast and L outer thigh-ecchymosis  L eye- small healed laceration and ecchymosis  Doing well  Rev s/s of head inj to watch for carefully  Fall prev disc  ER records rev in office with pt

## 2013-11-01 NOTE — Patient Instructions (Addendum)
If lopressor makes you dizzy or gives you side effects like low blood pressure let me know Be careful not to fall- use cane if you need it - and take your time getting around and pay attention If you develop a headache or vision problems or other symptoms please let me know right away Fall Prevention and Home Safety Falls cause injuries and can affect all age groups. It is possible to use preventive measures to significantly decrease the likelihood of falls. There are many simple measures which can make your home safer and prevent falls. OUTDOORS  Repair cracks and edges of walkways and driveways.  Remove high doorway thresholds.  Trim shrubbery on the main path into your home.  Have good outside lighting.  Clear walkways of tools, rocks, debris, and clutter.  Check that handrails are not broken and are securely fastened. Both sides of steps should have handrails.  Have leaves, snow, and ice cleared regularly.  Use sand or salt on walkways during winter months.  In the garage, clean up grease or oil spills. BATHROOM  Install night lights.  Install grab bars by the toilet and in the tub and shower.  Use non-skid mats or decals in the tub or shower.  Place a plastic non-slip stool in the shower to sit on, if needed.  Keep floors dry and clean up all water on the floor immediately.  Remove soap buildup in the tub or shower on a regular basis.  Secure bath mats with non-slip, double-sided rug tape.  Remove throw rugs and tripping hazards from the floors. BEDROOMS  Install night lights.  Make sure a bedside light is easy to reach.  Do not use oversized bedding.  Keep a telephone by your bedside.  Have a firm chair with side arms to use for getting dressed.  Remove throw rugs and tripping hazards from the floor. KITCHEN  Keep handles on pots and pans turned toward the center of the stove. Use back burners when possible.  Clean up spills quickly and allow time for  drying.  Avoid walking on wet floors.  Avoid hot utensils and knives.  Position shelves so they are not too high or low.  Place commonly used objects within easy reach.  If necessary, use a sturdy step stool with a grab bar when reaching.  Keep electrical cables out of the way.  Do not use floor polish or wax that makes floors slippery. If you must use wax, use non-skid floor wax.  Remove throw rugs and tripping hazards from the floor. STAIRWAYS  Never leave objects on stairs.  Place handrails on both sides of stairways and use them. Fix any loose handrails. Make sure handrails on both sides of the stairways are as long as the stairs.  Check carpeting to make sure it is firmly attached along stairs. Make repairs to worn or loose carpet promptly.  Avoid placing throw rugs at the top or bottom of stairways, or properly secure the rug with carpet tape to prevent slippage. Get rid of throw rugs, if possible.  Have an electrician put in a light switch at the top and bottom of the stairs. OTHER FALL PREVENTION TIPS  Wear low-heel or rubber-soled shoes that are supportive and fit well. Wear closed toe shoes.  When using a stepladder, make sure it is fully opened and both spreaders are firmly locked. Do not climb a closed stepladder.  Add color or contrast paint or tape to grab bars and handrails in your home. Place  contrasting color strips on first and last steps.  Learn and use mobility aids as needed. Install an electrical emergency response system.  Turn on lights to avoid dark areas. Replace light bulbs that burn out immediately. Get light switches that glow.  Arrange furniture to create clear pathways. Keep furniture in the same place.  Firmly attach carpet with non-skid or double-sided tape.  Eliminate uneven floor surfaces.  Select a carpet pattern that does not visually hide the edge of steps.  Be aware of all pets. OTHER HOME SAFETY TIPS  Set the water temperature  for 120 F (48.8 C).  Keep emergency numbers on or near the telephone.  Keep smoke detectors on every level of the home and near sleeping areas. Document Released: 08/02/2002 Document Revised: 02/11/2012 Document Reviewed: 11/01/2011 [facility] Patient Information 2014 [place], [place].

## 2013-11-01 NOTE — Assessment & Plan Note (Signed)
bp in fair control at this time - pt plans to add back lopressor due to diastolic over 90 as inst by cardiol  BP Readings from Last 1 Encounters:  11/01/13 130/92   No changes needed Disc lifstyle change with low sodium diet and exercise   Will update if any side eff on lopressor (disc)-pt has taken it in the past

## 2013-11-01 NOTE — Assessment & Plan Note (Signed)
Long disc about future fall prevention  Balance is good in office-pt is guarded with gait now that this has happened No fractures  Uses cane when needed Handout on fall prev given

## 2013-11-01 NOTE — Progress Notes (Signed)
Subjective:    Patient ID: [name] [name], female    DOB: Feb 28, 1923, 80 y.o.   MRN: [id number]  HPI Here for f/u after a fall  10/27/13- was walking into [employer] and tripped on the cement --on an incline and she mis stepped  Her shoe came off and she fall  Fell straight forward on her hands and her head did hit the sidewalk - and bruised her L eye  A lot of bleeding - but did not need stitches  A lot of people there helped her up - she was thankful for that  Called ambulance - met DIL at [place] her over/ gave her a Td and did not need xrays or labs  Came out of it ok   She is not dizzy Feels balance is ok  Uses a cane occas Is less confident and walks with the cane - also going slower   bp is up a bit more  - Dr Caryl Comes told her if bp goes up she needs to get back on lopressor   No cp or palpitations or headaches or edema  No side effects to medicines  BP Readings from Last 3 Encounters:  11/01/13 130/92  07/30/13 120/80  07/20/13 159/96     Has been on iron 4 months  Lab Results  Component Value Date   WBC 8.3 07/28/2013   HGB 10.6* 07/28/2013   HCT 31.8* 07/28/2013   MCV 89.7 07/28/2013   PLT 267.0 07/28/2013    Had labs this am   No ha No dizziness/ vision problems or confusion Fine overall - a little sore  A little anxious (shaken up)    Patient Active Problem List   Diagnosis Date Noted  . Fall due to stumbling 11/01/2013  . Contusion 11/01/2013  . Pacemaker -Medtronic 07/20/2013  . Angiodysplasia of colon with hemorrhage 06/25/2013  . Atrial fibrillation 06/06/2011  . ANXIETY 06/16/2007  . DEPRESSION 06/16/2007  . HYPERTENSION 06/16/2007  . GERD 06/16/2007   Past Medical History  Diagnosis Date  . Allergic rhinitis   . Anxiety   . Depression   . GERD (gastroesophageal reflux disease)   . Hypertension   . Osteopenia   . Bradycardia   . Hyperlipidemia   . Duodenal ulcer   . Other specified cardiac dysrhythmias(427.89)   . Diverticulosis     . GI bleed   . Atrial fibrillation   . Anemia associated with acute blood loss   . Diverticulosis    Past Surgical History  Procedure Laterality Date  . Appendectomy    . Total abdominal hysterectomy    . Tonsillectomy    . Ankle fracture surgery  05/2002  . Insert / replace / remove pacemaker    . Cardiac defibrillator placement    . Esophagogastroduodenoscopy N/A 06/24/2013    Procedure: ESOPHAGOGASTRODUODENOSCOPY (EGD);  Surgeon: Irene Shipper, MD;  Location: [facility];  Service: Endoscopy;  Laterality: N/A;  . Colonoscopy N/A 06/25/2013    Procedure: COLONOSCOPY;  Surgeon: Irene Shipper, MD;  Location: [facility];  Service: Endoscopy;  Laterality: N/A;   History  Substance Use Topics  . Smoking status: Never Smoker   . Smokeless tobacco: Never Used  . Alcohol Use: Yes     Comment: infrequently-rare   Family History  Problem Relation Age of Onset  . Heart attack Father   . Depression Mother   . Alzheimer's disease Mother   . Pneumonia Mother   . Heart disease Brother  hx of CABG   Allergies  Allergen Reactions  . Ace Inhibitors     REACTION: cough  . Alendronate Sodium     REACTION: aches  . Avapro [Irbesartan]     hypotension  . Dabigatran Etexilate Mesylate Diarrhea    indigestion  . Nitrofurantoin     REACTION: rash  . Penicillins     REACTION: rash  . Prednisone Other (See Comments)    Extreme weakness   Current Outpatient Prescriptions on File Prior to Visit  Medication Sig Dispense Refill  . cholecalciferol (VITAMIN D) 1000 UNITS tablet Take 2,000 Units by mouth daily.       . ferrous sulfate 325 (65 FE) MG tablet Take 1 tablet (325 mg total) by mouth 2 (two) times daily.  60 tablet  3  . Multiple Vitamin (MULTIVITAMIN) tablet Take 1 tablet by mouth daily.        Marland Kitchen omeprazole (PRILOSEC) 20 MG capsule Take 1 capsule (20 mg total) by mouth daily.  30 capsule  11  . promethazine-dextromethorphan (PROMETHAZINE-DM) 6.25-15 MG/5ML syrup Take 5 mLs  by mouth 4 (four) times daily as needed for cough.  118 mL  0  . sertraline (ZOLOFT) 100 MG tablet Take 1 tablet (100 mg total) by mouth daily.  90 tablet  0   No current facility-administered medications on file prior to visit.    Review of Systems Review of Systems  Constitutional: Negative for fever, appetite change, fatigue and unexpected weight change.  Eyes: Negative for pain and visual disturbance.  Respiratory: Negative for cough and shortness of breath.   Cardiovascular: Negative for cp or palpitations    Gastrointestinal: Negative for nausea, diarrhea and constipation.  Genitourinary: Negative for urgency and frequency.  Skin: Negative for pallor or rash  pos for healing scab near L eyebrow Neurological: Negative for weakness, light-headedness, numbness and headaches.  Hematological: Negative for adenopathy. Does not bruise/bleed easily.  Psychiatric/Behavioral: Negative for dysphoric mood. The patient is mildly  nervous/anxious.         Objective:   Physical Exam  Constitutional: She appears well-developed and well-nourished. No distress.  HENT:  Head: Normocephalic.  Right Ear: External ear normal.  Left Ear: External ear normal.  Mouth/Throat: Oropharynx is clear and moist.  Ecchymosis over L eye and eyelid with small healing scab  Eyes: Conjunctivae and EOM are normal. Pupils are equal, round, and reactive to light. No scleral icterus.  Neck: Normal range of motion. Neck supple. No JVD present. Carotid bruit is not present. No thyromegaly present.  Cardiovascular: Normal rate, normal heart sounds and intact distal pulses.  Exam reveals no gallop.   Pulmonary/Chest: Effort normal and breath sounds normal. No respiratory distress. She has no wheezes. She exhibits no tenderness.  Abdominal: Soft. Bowel sounds are normal. She exhibits no abdominal bruit.  Genitourinary: No breast swelling, tenderness, discharge or bleeding.  Musculoskeletal: Normal range of motion. She  exhibits tenderness. She exhibits no edema.  Mild tenderness over L breast and L lateral thigh where she has large areas of ecchymosis -without crepitus or laceration Gait is nl  Lymphadenopathy:    She has no cervical adenopathy.  Neurological: She is alert. She has normal reflexes. She displays no atrophy and no tremor. No cranial nerve deficit or sensory deficit. She exhibits normal muscle tone. Coordination and gait normal.  Skin: Skin is warm and dry. No rash noted. No erythema. No pallor.  Psychiatric: She has a normal mood and affect.  Assessment & Plan:

## 2013-11-02 ENCOUNTER — Encounter: Payer: Self-pay | Admitting: *Deleted

## 2013-11-03 ENCOUNTER — Other Ambulatory Visit: Payer: Medicare Other

## 2013-11-10 ENCOUNTER — Ambulatory Visit (INDEPENDENT_AMBULATORY_CARE_PROVIDER_SITE_OTHER): Payer: Commercial Managed Care - HMO | Admitting: Family Medicine

## 2013-11-10 ENCOUNTER — Encounter: Payer: Self-pay | Admitting: Family Medicine

## 2013-11-10 ENCOUNTER — Ambulatory Visit (INDEPENDENT_AMBULATORY_CARE_PROVIDER_SITE_OTHER)
Admission: RE | Admit: 2013-11-10 | Discharge: 2013-11-10 | Disposition: A | Discharge status: Home / Self Care | Payer: Commercial Managed Care - HMO | Source: Ambulatory Visit | Attending: Family Medicine | Admitting: Family Medicine

## 2013-11-10 VITALS — BP 135/80 | HR 74 | Temp 98.9°F | Ht 62.25 in | Wt 148.5 lb

## 2013-11-10 DIAGNOSIS — M79609 Pain in unspecified limb: Secondary | ICD-10-CM

## 2013-11-10 DIAGNOSIS — M79642 Pain in left hand: Secondary | ICD-10-CM | POA: Insufficient documentation

## 2013-11-10 DIAGNOSIS — Z0000 Encounter for general adult medical examination without abnormal findings: Secondary | ICD-10-CM | POA: Insufficient documentation

## 2013-11-10 DIAGNOSIS — M949 Disorder of cartilage, unspecified: Secondary | ICD-10-CM

## 2013-11-10 DIAGNOSIS — M8580 Other specified disorders of bone density and structure, unspecified site: Secondary | ICD-10-CM

## 2013-11-10 DIAGNOSIS — M899 Disorder of bone, unspecified: Secondary | ICD-10-CM

## 2013-11-10 DIAGNOSIS — W010XXA Fall on same level from slipping, tripping and stumbling without subsequent striking against object, initial encounter: Secondary | ICD-10-CM

## 2013-11-10 DIAGNOSIS — I10 Essential (primary) hypertension: Secondary | ICD-10-CM

## 2013-11-10 MED ORDER — OMEPRAZOLE 20 MG PO CPDR: 20.0000 mg | DELAYED_RELEASE_CAPSULE | Freq: Every day | ORAL | Status: DC

## 2013-11-10 NOTE — Patient Instructions (Addendum)
[facility] you are [facility] (I would wait about 2 months or more due to bruising) You can stop your iron  Hand xray today  Call us when you are ready for a bone density test so we can schedule it   Take care of yourself

## 2013-11-10 NOTE — Progress Notes (Signed)
Subjective:    Patient ID: [name] [name], female    DOB: [birth year], 80 y.o.   MRN: [id number]  HPI I have personally reviewed the Medicare Annual Wellness questionnaire and have noted 1. The patient's medical and social history 2. Their use of alcohol, tobacco or illicit drugs 3. Their current medications and supplements 4. The patient's functional ability including ADL's, fall risks, home safety risks and hearing or visual             impairment. 5. Diet and physical activities 6. Evidence for depression or mood disorders  The patients weight, height, BMI have been recorded in the chart and visual acuity is per eye clinic.  I have made referrals, counseling and provided education to the patient based review of the above and I have provided the pt with a written personalized care plan for preventive services.  She is hurting more since her fall  Is walking ok  L hip sore  L hand is quite sore -was swollen and then it went down and now it is swollen again and it hurts more than it did  Can grip but it is uncomfortable   See scanned forms.  Routine anticipatory guidance given to patient.  See health maintenance. Colon cancer screening 10/14 - up to date  Breast cancer screening has not had a mammo since 2010 - she does want to get them  Self breast exam- no lumps (except bruise from her fall)  No gyn problems at all/ hyst in the past  Flu vaccine 9/14 Tetanus vaccine 3/15  Pneumovax 9/13  Zoster vaccine 3/14   Advance directive-  she has a living will  Cognitive function addressed- see scanned forms- and if abnormal then additional documentation follows.- no major memory problems    PMH and SH reviewed  Meds, vitals, and allergies reviewed.   ROS: See HPI.  Otherwise negative.    Has had one fall due to stumbling   Osteopenia dexa 8/07 Not interested in another one yet  fx hx - hard fall and did not break any bones   bp is up a bit  today - usually high on  first check - did re start her lopressor as planned  No cp or palpitations or headaches or edema  No side effects to medicines  BP Readings from Last 3 Encounters:  11/10/13 150/90  11/01/13 130/92  07/30/13 120/80     Hyperlipidemia Lab Results  Component Value Date   CHOL 191 11/01/2013   CHOL 205* 11/02/2012   CHOL 224* 08/12/2011   Lab Results  Component Value Date   HDL 52.20 11/01/2013   HDL 58.00 11/02/2012   HDL 40.98 08/12/2011   Lab Results  Component Value Date   LDLCALC 114* 11/01/2013   LDLCALC 127* 09/18/2007   Lab Results  Component Value Date   TRIG 125.0 11/01/2013   TRIG 67.0 11/02/2012   TRIG 71.0 08/12/2011   Lab Results  Component Value Date   CHOLHDL 4 11/01/2013   CHOLHDL 4 11/02/2012   CHOLHDL 4 08/12/2011   Lab Results  Component Value Date   LDLDIRECT 138.1 11/02/2012   LDLDIRECT 141.5 08/12/2011   LDLDIRECT 146.6 07/25/2010   she eats better than she used to  More vegetables and not as many sweets   Glucose nl at 92  Lab Results  Component Value Date   WBC 7.3 11/01/2013   HGB 12.6 11/01/2013   HCT 37.4 11/01/2013   MCV 89.1 11/01/2013  PLT 235.0 11/01/2013    Lab Results  Component Value Date   TSH 3.39 11/01/2013    Patient Active Problem List   Diagnosis Date Noted  . Encounter for Medicare annual wellness exam 11/10/2013  . Hand pain, left 11/10/2013  . Fall due to stumbling 11/01/2013  . Contusion 11/01/2013  . Pacemaker -Medtronic 07/20/2013  . Angiodysplasia of colon with hemorrhage 06/25/2013  . Atrial fibrillation 06/06/2011  . ANXIETY 06/16/2007  . DEPRESSION 06/16/2007  . HYPERTENSION 06/16/2007  . GERD 06/16/2007   Past Medical History  Diagnosis Date  . Allergic rhinitis   . Anxiety   . Depression   . GERD (gastroesophageal reflux disease)   . Hypertension   . Osteopenia   . Bradycardia   . Hyperlipidemia   . Duodenal ulcer   . Other specified cardiac dysrhythmias(427.89)   . Diverticulosis   . GI bleed   . Atrial  fibrillation   . Anemia associated with acute blood loss   . Diverticulosis    Past Surgical History  Procedure Laterality Date  . Appendectomy    . Total abdominal hysterectomy    . Tonsillectomy    . Ankle fracture surgery  05/2002  . Insert / replace / remove pacemaker    . Cardiac defibrillator placement    . Esophagogastroduodenoscopy N/A 06/24/2013    Procedure: ESOPHAGOGASTRODUODENOSCOPY (EGD);  Surgeon: Hilarie FredricksonJohn N Perry, MD;  [facility];  Service: Endoscopy;  Laterality: N/A;  . Colonoscopy N/A 06/25/2013    Procedure: COLONOSCOPY;  Surgeon: Hilarie FredricksonJohn N Perry, MD;  Location: [facility];  Service: Endoscopy;  Laterality: N/A;   History  Substance Use Topics  . Smoking status: Never Smoker   . Smokeless tobacco: Never Used  . Alcohol Use: Yes     Comment: infrequently-rare   Family History  Problem Relation Age of Onset  . Heart attack Father   . Depression Mother   . Alzheimer's disease Mother   . Pneumonia Mother   . Heart disease Brother     hx of CABG   Allergies  Allergen Reactions  . Ace Inhibitors     REACTION: cough  . Alendronate Sodium     REACTION: aches  . Avapro [Irbesartan]     hypotension  . Dabigatran Etexilate Mesylate Diarrhea    indigestion  . Nitrofurantoin     REACTION: rash  . Penicillins     REACTION: rash  . Prednisone Other (See Comments)    Extreme weakness   Current Outpatient Prescriptions on File Prior to Visit  Medication Sig Dispense Refill  . cholecalciferol (VITAMIN D) 1000 UNITS tablet Take 2,000 Units by mouth daily.       . ferrous sulfate 325 (65 FE) MG tablet Take 1 tablet (325 mg total) by mouth 2 (two) times daily.  60 tablet  3  . hydrochlorothiazide (HYDRODIURIL) 25 MG tablet Take 1 tablet (25 mg total) by mouth daily.  90 tablet  3  . metoprolol tartrate (LOPRESSOR) 25 MG tablet Take 1 tablet (25 mg total) by mouth 2 (two) times daily.  180 tablet  3  . Multiple Vitamin (MULTIVITAMIN) tablet Take 1 tablet by  mouth daily.        . sertraline (ZOLOFT) 100 MG tablet Take 1 tablet (100 mg total) by mouth daily.  90 tablet  0   No current facility-administered medications on file prior to visit.    Review of Systems Review of Systems  Constitutional: Negative for fever, appetite change, fatigue and  unexpected weight change.  Eyes: Negative for pain and visual disturbance.  Respiratory: Negative for cough and shortness of breath.   Cardiovascular: Negative for cp or palpitations    Gastrointestinal: Negative for nausea, diarrhea and constipation.  Genitourinary: Negative for urgency and frequency.  Skin: Negative for pallor or rash   MSK pos for soreness on L side after recent fall / incl hand/ pos for swelling of medial hand Neurological: Negative for weakness, light-headedness, numbness and headaches.  Hematological: Negative for adenopathy. Does not bruise/bleed easily.  Psychiatric/Behavioral: Negative for dysphoric mood. The patient is not nervous/anxious.         Objective:   Physical Exam  Constitutional: She appears well-developed and well-nourished. No distress.  HENT:  Head: Normocephalic and atraumatic.  Right Ear: External ear normal.  Left Ear: External ear normal.  Mouth/Throat: Oropharynx is clear and moist.  Eyes: Conjunctivae and EOM are normal. Pupils are equal, round, and reactive to light. No scleral icterus.  Neck: Normal range of motion. Neck supple. No JVD present. Carotid bruit is not present. No thyromegaly present.  Cardiovascular: Normal rate, regular rhythm, normal heart sounds and intact distal pulses.  Exam reveals no gallop.   Pulmonary/Chest: Effort normal and breath sounds normal. No respiratory distress. She has no wheezes. She exhibits no tenderness.  Abdominal: Soft. Bowel sounds are normal. She exhibits no distension, no abdominal bruit and no mass. There is no tenderness.  Genitourinary: There is breast tenderness. No breast discharge or bleeding.    Diffuse ecchymosis of L breast with scar tissue present under the bruises No nipple d/c  Rest of breast exam is normal   Musculoskeletal: Normal range of motion. She exhibits edema and tenderness.  L hand has medial tenderness and swelling with scant bruising  Mostly over her 5th metacarpal Nl rom and limited grip due to pain  Nl perf and sensation    Lymphadenopathy:    She has no cervical adenopathy.  Neurological: She is alert. She has normal reflexes. No cranial nerve deficit. She exhibits normal muscle tone. Coordination normal.  Skin: Skin is warm and dry. No rash noted. No erythema. No pallor.  Ecchymosis of L side and hip area -resolving   Psychiatric: She has a normal mood and affect.          Assessment & Plan:

## 2013-11-10 NOTE — Progress Notes (Signed)
Pre visit review using our clinic review tool, if applicable. No additional management support is needed unless otherwise documented below in the visit note. 

## 2013-11-11 DIAGNOSIS — M8580 Other specified disorders of bone density and structure, unspecified site: Secondary | ICD-10-CM | POA: Insufficient documentation

## 2013-11-11 NOTE — Assessment & Plan Note (Signed)
Xray of R hand due to inc swelling Other injuries appear to be improved  Disc need for warm compresses and continued activity Safety discussed in and out of the home

## 2013-11-11 NOTE — Assessment & Plan Note (Signed)
bp in fair control at this time -better on 2nd check today Doing ok with lopressor again  BP Readings from Last 1 Encounters:  11/10/13 135/80   No changes needed Disc lifstyle change with low sodium diet and exercise   Labs reviewed

## 2013-11-11 NOTE — Assessment & Plan Note (Signed)
Reviewed health habits including diet and exercise and skin cancer prevention Reviewed appropriate screening tests for age  Also reviewed health mt list, fam hx and immunization status , as well as social and family history   See HPI Lab reviewed  

## 2013-11-11 NOTE — Assessment & Plan Note (Signed)
dexa 07 Pt declines further dexa at this time Disc need for calcium/ vitamin D/ wt bearing exercise and bone density test every 2 y to monitor Disc safety/ fracture risk in detail  Fortunately no fx from recent fall

## 2013-11-11 NOTE — Assessment & Plan Note (Signed)
Xray today due to re occurrence of swelling Rev use of cold compresses and elevation when able  She may have over used it after initial improvement

## 2013-11-18 ENCOUNTER — Telehealth: Payer: Self-pay | Admitting: Internal Medicine

## 2013-11-18 NOTE — Telephone Encounter (Signed)
New message     Pt got letter stating she missed her remote transmission.  She said she sent it on 10-21-13 around 10:30ish.  She want to know if you got it.

## 2013-11-18 NOTE — Telephone Encounter (Signed)
Patient informed that remote was not received. 800# given for further assistance.

## 2013-12-03 ENCOUNTER — Ambulatory Visit (INDEPENDENT_AMBULATORY_CARE_PROVIDER_SITE_OTHER): Payer: Commercial Managed Care - HMO | Admitting: Internal Medicine

## 2013-12-03 ENCOUNTER — Encounter: Payer: Self-pay | Admitting: Internal Medicine

## 2013-12-03 VITALS — BP 132/84 | HR 90 | Temp 98.2°F | Wt 147.5 lb

## 2013-12-03 DIAGNOSIS — H6120 Impacted cerumen, unspecified ear: Secondary | ICD-10-CM

## 2013-12-03 NOTE — Progress Notes (Signed)
Pre visit review using our clinic review tool, if applicable. No additional management support is needed unless otherwise documented below in the visit note. 

## 2013-12-03 NOTE — Progress Notes (Signed)
Subjective:    Patient ID: [name] DingwallVirginia Y [name], female    DOB: 08/18/1923, 80 y.o.   MRN: [id number]  HPI  Pt presents to the clinic today with c/o ear fullness. She noticed this this morning. She reports that she can hear out of here left ear. She denies fever, chills or body aches. She has not tried anything OTC.  Review of Systems      Past Medical History  Diagnosis Date  . Allergic rhinitis   . Anxiety   . Depression   . GERD (gastroesophageal reflux disease)   . Hypertension   . Osteopenia   . Bradycardia   . Hyperlipidemia   . Duodenal ulcer   . Other specified cardiac dysrhythmias(427.89)   . Diverticulosis   . GI bleed   . Atrial fibrillation   . Anemia associated with acute blood loss   . Diverticulosis     Current Outpatient Prescriptions  Medication Sig Dispense Refill  . cholecalciferol (VITAMIN D) 1000 UNITS tablet Take 2,000 Units by mouth daily.       . ferrous sulfate 325 (65 FE) MG tablet Take 1 tablet (325 mg total) by mouth 2 (two) times daily.  60 tablet  3  . hydrochlorothiazide (HYDRODIURIL) 25 MG tablet Take 1 tablet (25 mg total) by mouth daily.  90 tablet  3  . metoprolol tartrate (LOPRESSOR) 25 MG tablet Take 1 tablet (25 mg total) by mouth 2 (two) times daily.  180 tablet  3  . Multiple Vitamin (MULTIVITAMIN) tablet Take 1 tablet by mouth daily.        Marland Kitchen. omeprazole (PRILOSEC) 20 MG capsule Take 1 capsule (20 mg total) by mouth daily.  90 capsule  3  . sertraline (ZOLOFT) 100 MG tablet Take 1 tablet (100 mg total) by mouth daily.  90 tablet  0   No current facility-administered medications for this visit.    Allergies  Allergen Reactions  . Ace Inhibitors     REACTION: cough  . Alendronate Sodium     REACTION: aches  . Avapro [Irbesartan]     hypotension  . Dabigatran Etexilate Mesylate Diarrhea    indigestion  . Nitrofurantoin     REACTION: rash  . Penicillins     REACTION: rash  . Prednisone Other (See Comments)    Extreme  weakness    Family History  Problem Relation Age of Onset  . Heart attack Father   . Depression Mother   . Alzheimer's disease Mother   . Pneumonia Mother   . Heart disease Brother     hx of CABG    History   Social History  . Marital Status: Married    Spouse Name: N/A    Number of Children: N/A  . Years of Education: N/A   Occupational History  . Not on file.   Social History Main Topics  . Smoking status: Never Smoker   . Smokeless tobacco: Never Used  . Alcohol Use: Yes     Comment: infrequently-rare  . Drug Use: No  . Sexual Activity: Not on file   Other Topics Concern  . Not on file   Social History Narrative  . No narrative on file     Constitutional: Denies fever, malaise, fatigue, headache or abrupt weight changes.  HEENT: Pt reports ear fullness. Denies eye pain, eye redness, ear pain, ringing in the ears, wax buildup, runny nose, nasal congestion, bloody nose, or sore throat.   No other specific complaints in a  complete review of systems (except as listed in HPI above).  Objective:   Physical Exam   BP 132/84  Pulse 90  Temp(Src) 98.2 F (36.8 C) (Oral)  Wt 147 lb 8 oz (66.906 kg)  SpO2 96% Wt Readings from Last 3 Encounters:  12/03/13 147 lb 8 oz (66.906 kg)  11/10/13 148 lb 8 oz (67.359 kg)  11/01/13 147 lb 12 oz (67.019 kg)    General: Appears their stated age, well developed, well nourished in NAD. Skin: Warm, dry and intact. No rashes, lesions or ulcerations noted. HEENT:  Ears: Bilateral cerumen impaction   BMET    Component Value Date/Time   NA 137 11/01/2013 0912   K 4.0 11/01/2013 0912   CL 97 11/01/2013 0912   CO2 33* 11/01/2013 0912   GLUCOSE 92 11/01/2013 0912   BUN 18 11/01/2013 0912   CREATININE 0.9 11/01/2013 0912   CALCIUM 10.0 11/01/2013 0912   GFRNONAA 47* 06/23/2013 1701   GFRAA 55* 06/23/2013 1701    Lipid Panel     Component Value Date/Time   CHOL 191 11/01/2013 0912   TRIG 125.0 11/01/2013 0912   HDL 52.20 11/01/2013  0912   CHOLHDL 4 11/01/2013 0912   VLDL 25.0 11/01/2013 0912   LDLCALC 114* 11/01/2013 0912    CBC    Component Value Date/Time   WBC 7.3 11/01/2013 0912   RBC 4.20 11/01/2013 0912   HGB 12.6 11/01/2013 0912   HCT 37.4 11/01/2013 0912   PLT 235.0 11/01/2013 0912   MCV 89.1 11/01/2013 0912   MCH 30.9 06/26/2013 0525   MCHC 33.8 11/01/2013 0912   RDW 16.3* 11/01/2013 0912   LYMPHSABS 2.0 11/01/2013 0912   MONOABS 0.7 11/01/2013 0912   EOSABS 0.3 11/01/2013 0912   BASOSABS 0.0 11/01/2013 0912    Hgb A1C No results found for this basename: HGBA1C        Assessment & Plan:   Bilateral cerumen impaction:  Manual lavage by CMA Try Debrox solution OTC  RTC as needed or if symptoms persist or worsen

## 2013-12-03 NOTE — Patient Instructions (Addendum)
Cerumen Impaction A cerumen impaction is when the wax in your ear forms a plug. This plug usually causes reduced hearing. Sometimes it also causes an earache or dizziness. Removing a cerumen impaction can be difficult and painful. The wax sticks to the ear canal. The canal is sensitive and bleeds easily. If you try to remove a heavy wax buildup with a cotton tipped swab, you may push it in further. Irrigation with water, suction, and small ear curettes may be used to clear out the wax. If the impaction is fixed to the skin in the ear canal, ear drops may be needed for a few days to loosen the wax. People who build up a lot of wax frequently can use ear wax removal products available in your local drugstore. SEEK MEDICAL CARE IF:  You develop an earache, increased hearing loss, or marked dizziness. Document Released: 09/19/2004 Document Revised: 11/04/2011 Document Reviewed: 11/09/2009 [employer] Patient Information 2014 [employer].  

## 2014-01-10 ENCOUNTER — Ambulatory Visit (INDEPENDENT_AMBULATORY_CARE_PROVIDER_SITE_OTHER)
Admission: RE | Admit: 2014-01-10 | Discharge: 2014-01-10 | Disposition: A | Discharge status: Home / Self Care | Payer: Commercial Managed Care - HMO | Source: Ambulatory Visit | Attending: Family Medicine | Admitting: Family Medicine

## 2014-01-10 ENCOUNTER — Encounter: Payer: Self-pay | Admitting: Family Medicine

## 2014-01-10 ENCOUNTER — Telehealth: Payer: Self-pay | Admitting: Family Medicine

## 2014-01-10 ENCOUNTER — Ambulatory Visit (INDEPENDENT_AMBULATORY_CARE_PROVIDER_SITE_OTHER): Payer: Commercial Managed Care - HMO | Admitting: Family Medicine

## 2014-01-10 VITALS — BP 136/88 | HR 79 | Temp 98.3°F | Ht 62.25 in | Wt 148.0 lb

## 2014-01-10 DIAGNOSIS — M25552 Pain in left hip: Secondary | ICD-10-CM | POA: Insufficient documentation

## 2014-01-10 DIAGNOSIS — M25559 Pain in unspecified hip: Secondary | ICD-10-CM

## 2014-01-10 DIAGNOSIS — M169 Osteoarthritis of hip, unspecified: Secondary | ICD-10-CM

## 2014-01-10 MED ORDER — ACETAMINOPHEN-CODEINE #3 300-30 MG PO TABS: 1.0000 | ORAL_TABLET | Freq: Three times a day (TID) | ORAL | Status: DC | PRN

## 2014-01-10 NOTE — Progress Notes (Signed)
Subjective:    Patient ID: [name] [name], female    DOB: 08/23/1923, 80 y.o.   MRN: [id number]  HPI Here with hip pain in the L side (pain is actually in L groin) 3-4 years ago - had xray and eval with ortho - did have some evid of OA- given pain med and ref to PT (she did not do) Pain is worse now  When she had her recent fall- that hip took the brunt of it  She is using a cane  Limited walking    She needs pain control  Taking tylenol-no imp   Patient Active Problem List   Diagnosis Date Noted  . Osteopenia 11/11/2013  . Encounter for Medicare annual wellness exam 11/10/2013  . Hand pain, left 11/10/2013  . Fall due to stumbling 11/01/2013  . Contusion 11/01/2013  . Pacemaker -Medtronic 07/20/2013  . Angiodysplasia of colon with hemorrhage 06/25/2013  . Atrial fibrillation 06/06/2011  . ANXIETY 06/16/2007  . DEPRESSION 06/16/2007  . HYPERTENSION 06/16/2007  . GERD 06/16/2007   Past Medical History  Diagnosis Date  . Allergic rhinitis   . Anxiety   . Depression   . GERD (gastroesophageal reflux disease)   . Hypertension   . Osteopenia   . Bradycardia   . Hyperlipidemia   . Duodenal ulcer   . Other specified cardiac dysrhythmias(427.89)   . Diverticulosis   . GI bleed   . Atrial fibrillation   . Anemia associated with acute blood loss   . Diverticulosis    Past Surgical History  Procedure Laterality Date  . Appendectomy    . Total abdominal hysterectomy    . Tonsillectomy    . Ankle fracture surgery  05/2002  . Insert / replace / remove pacemaker    . Cardiac defibrillator placement    . Esophagogastroduodenoscopy N/A 06/24/2013    Procedure: ESOPHAGOGASTRODUODENOSCOPY (EGD);  Surgeon: Hilarie FredricksonJohn N Perry, MD;  [facility];  Service: Endoscopy;  Laterality: N/A;  . Colonoscopy N/A 06/25/2013    Procedure: COLONOSCOPY;  Surgeon: Hilarie FredricksonJohn N Perry, MD;  Location: [facility];  Service: Endoscopy;  Laterality: N/A;   History  Substance Use Topics    . Smoking status: Never Smoker   . Smokeless tobacco: Never Used  . Alcohol Use: Yes     Comment: infrequently-rare   Family History  Problem Relation Age of Onset  . Heart attack Father   . Depression Mother   . Alzheimer's disease Mother   . Pneumonia Mother   . Heart disease Brother     hx of CABG   Allergies  Allergen Reactions  . Ace Inhibitors     REACTION: cough  . Alendronate Sodium     REACTION: aches  . Avapro [Irbesartan]     hypotension  . Dabigatran Etexilate Mesylate Diarrhea    indigestion  . Nitrofurantoin     REACTION: rash  . Penicillins     REACTION: rash  . Prednisone Other (See Comments)    Extreme weakness   Current Outpatient Prescriptions on File Prior to Visit  Medication Sig Dispense Refill  . cholecalciferol (VITAMIN D) 1000 UNITS tablet Take 2,000 Units by mouth daily.       . hydrochlorothiazide (HYDRODIURIL) 25 MG tablet Take 1 tablet (25 mg total) by mouth daily.  90 tablet  3  . metoprolol tartrate (LOPRESSOR) 25 MG tablet Take 1 tablet (25 mg total) by mouth 2 (two) times daily.  180 tablet  3  . Multiple Vitamin (  MULTIVITAMIN) tablet Take 1 tablet by mouth daily.        Marland Kitchen. omeprazole (PRILOSEC) 20 MG capsule Take 1 capsule (20 mg total) by mouth daily.  90 capsule  3  . sertraline (ZOLOFT) 100 MG tablet Take 1 tablet (100 mg total) by mouth daily.  90 tablet  0   No current facility-administered medications on file prior to visit.    Review of Systems Review of Systems  Constitutional: Negative for fever, appetite change, fatigue and unexpected weight change.  Eyes: Negative for pain and visual disturbance.  Respiratory: Negative for cough and shortness of breath.   Cardiovascular: Negative for cp or palpitations    Gastrointestinal: Negative for nausea, diarrhea and constipation.  Genitourinary: Negative for urgency and frequency.  Skin: Negative for pallor or rash   MSK pos for L groin (hip) pain , neg for joint swelling/ neg for  back pain  Neurological: Negative for weakness, light-headedness, numbness and headaches.  Hematological: Negative for adenopathy. Does not bruise/bleed easily.  Psychiatric/Behavioral: Negative for dysphoric mood. The patient is not nervous/anxious.         Objective:   Physical Exam  Constitutional: She appears well-developed and well-nourished. No distress.  HENT:  Head: Normocephalic and atraumatic.  Eyes: Conjunctivae and EOM are normal. Pupils are equal, round, and reactive to light.  Neck: Normal range of motion. Neck supple.  Cardiovascular: Normal rate and regular rhythm.   Pulmonary/Chest: Effort normal and breath sounds normal.  Musculoskeletal: She exhibits tenderness. She exhibits no edema.       Left hip: She exhibits decreased range of motion, tenderness and bony tenderness. She exhibits normal strength, no swelling, no crepitus and no deformity.  Limited internal rotation of L hip due to pain  Ext rotation is less painful  Flex about 90 deg with pain  Nl perfusion of leg and foot  Gait favors R side with a cane  Lymphadenopathy:    She has no cervical adenopathy.  Neurological: She is alert. She has normal reflexes. She exhibits normal muscle tone.  Skin: Skin is warm and dry. No rash noted.  Psychiatric: She has a normal mood and affect.          Assessment & Plan:

## 2014-01-10 NOTE — Assessment & Plan Note (Signed)
Suspect OA has progressed   (also pt had a fall) Xray today  Pt interested in pain control - high risk for nsaids Disc fall risk with narcotics Choose to try tylenol #3 with caution of falls and sedation  May need orthopedic visit

## 2014-01-10 NOTE — Telephone Encounter (Signed)
Message copied by Judy PimpleWER, Izza Bickle A on Mon Jan 10, 2014  5:22 PM ------      Message from: Shon MilletWATLINGTON, SHAPALE M      Created: Mon Jan 10, 2014  4:12 PM       Pt notified of xray results and Dr. Royden Purlower's comments/recommendations. Pt does want to see ortho asap and she doesn't have a particular practice she wants to go to [place] ------

## 2014-01-10 NOTE — Progress Notes (Signed)
Pre visit review using our clinic review tool, if applicable. No additional management support is needed unless otherwise documented below in the visit note. 

## 2014-01-10 NOTE — Patient Instructions (Signed)
Xray of hip today  Try the tylenol 3 as needed - (avoid tylenol over the counter) and if may cause sedation/ dizziness - use great caution Take a stool softener if needed  We will get back to you with xray result

## 2014-01-18 ENCOUNTER — Ambulatory Visit (INDEPENDENT_AMBULATORY_CARE_PROVIDER_SITE_OTHER): Payer: Commercial Managed Care - HMO | Admitting: Family Medicine

## 2014-01-18 ENCOUNTER — Encounter: Payer: Self-pay | Admitting: Family Medicine

## 2014-01-18 VITALS — BP 140/86 | HR 90 | Temp 98.4°F | Ht 62.25 in | Wt 148.0 lb

## 2014-01-18 DIAGNOSIS — J392 Other diseases of pharynx: Secondary | ICD-10-CM | POA: Insufficient documentation

## 2014-01-18 NOTE — Progress Notes (Signed)
Pre visit review using our clinic review tool, if applicable. No additional management support is needed unless otherwise documented below in the visit note. 

## 2014-01-18 NOTE — Progress Notes (Signed)
Subjective:    Patient ID: [name] DingwallVirginia Y [name], female    DOB: 02/15/1923, 80 y.o.   MRN: [id number]  HPI Has an ulcer in her throat -nagging since last week/ bothersome  It is red and not painful (but she is aware it is there)  No problem with swallowing  No hx of cold sore or fever blister No known trauma  No snuff or tobacco and never smoked   Patient Active Problem List   Diagnosis Date Noted  . Lesion of throat 01/18/2014  . Left hip pain 01/10/2014  . Hip osteoarthritis 01/10/2014  . Osteopenia 11/11/2013  . Encounter for Medicare annual wellness exam 11/10/2013  . Hand pain, left 11/10/2013  . Fall due to stumbling 11/01/2013  . Contusion 11/01/2013  . Pacemaker -[employer]tr[employer] 07/20/2013  . Angiodysplasia of colon with hemorrhage 06/25/2013  . Atrial fibrillation 06/06/2011  . ANXIETY 06/16/2007  . DEPRESSION 06/16/2007  . HYPERTENSION 06/16/2007  . GERD 06/16/2007   Past Medical History  Diagnosis Date  . Allergic rhinitis   . Anxiety   . Depression   . GERD (gastroesophageal reflux disease)   . Hypertension   . Osteopenia   . Bradycardia   . Hyperlipidemia   . Duodenal ulcer   . Other specified cardiac dysrhythmias(427.89)   . Diverticulosis   . GI bleed   . Atrial fibrillation   . Anemia associated with acute blood loss   . Diverticulosis    Past Surgical History  Procedure Laterality Date  . Appendectomy    . Total abdominal hysterectomy    . Tonsillectomy    . Ankle fracture surgery  05/2002  . Insert / replace / remove pacemaker    . Cardiac defibrillator placement    . Esophagogastroduodenoscopy N/A 06/24/2013    Procedure: ESOPHAGOGASTRODUODENOSCOPY (EGD);  Surgeon: Hilarie FredricksonJohn N Perry, MD;  [facility];  Service: Endoscopy;  Laterality: N/A;  . Colonoscopy N/A 06/25/2013    Procedure: COLONOSCOPY;  Surgeon: Hilarie FredricksonJohn N Perry, MD;  Location: [facility];  Service: Endoscopy;  Laterality: N/A;   History  Substance Use Topics  . Smoking  status: Never Smoker   . Smokeless tobacco: Never Used  . Alcohol Use: Yes     Comment: infrequently-rare   Family History  Problem Relation Age of Onset  . Heart attack Father   . Depression Mother   . Alzheimer's disease Mother   . Pneumonia Mother   . Heart disease Brother     hx of CABG   Allergies  Allergen Reactions  . Ace Inhibitors     REACTION: cough  . Alendronate Sodium     REACTION: aches  . Avapro [Irbesartan]     hypotension  . Codeine     Dizzy and nauseated   . Dabigatran Etexilate Mesylate Diarrhea    indigestion  . Nitrofurantoin     REACTION: rash  . Penicillins     REACTION: rash  . Prednisone Other (See Comments)    Extreme weakness   Current Outpatient Prescriptions on File Prior to Visit  Medication Sig Dispense Refill  . cholecalciferol (VITAMIN D) 1000 UNITS tablet Take 2,000 Units by mouth daily.       . hydrochlorothiazide (HYDRODIURIL) 25 MG tablet Take 1 tablet (25 mg total) by mouth daily.  90 tablet  3  . metoprolol tartrate (LOPRESSOR) 25 MG tablet Take 1 tablet (25 mg total) by mouth 2 (two) times daily.  180 tablet  3  . Multiple Vitamin (MULTIVITAMIN) tablet Take  1 tablet by mouth daily.        Marland Kitchen omeprazole (PRILOSEC) 20 MG capsule Take 1 capsule (20 mg total) by mouth daily.  90 capsule  3  . sertraline (ZOLOFT) 100 MG tablet Take 1 tablet (100 mg total) by mouth daily.  90 tablet  0   No current facility-administered medications on file prior to visit.    Review of Systems Review of Systems  Constitutional: Negative for fever, appetite change, fatigue and unexpected weight change.  ENt neg for ST/congestion/ drip or sinus pain  Eyes: Negative for pain and visual disturbance.  Respiratory: Negative for cough and shortness of breath.   Cardiovascular: Negative for cp or palpitations    Gastrointestinal: Negative for nausea, diarrhea and constipation.  Genitourinary: Negative for urgency and frequency.  Skin: Negative for pallor  or rash   Neurological: Negative for weakness, light-headedness, numbness and headaches.  Hematological: Negative for adenopathy. Does not bruise/bleed easily.  Psychiatric/Behavioral: Negative for dysphoric mood. The patient is not nervous/anxious.         Objective:   Physical Exam  Constitutional: She appears well-developed and well-nourished. No distress.  HENT:  Head: Normocephalic and atraumatic.  Right Ear: External ear normal.  Left Ear: External ear normal.  Nose: Nose normal.  Mouth/Throat: Oropharynx is clear and moist.  See skin exam for lesion in throat   Eyes: Conjunctivae and EOM are normal. Pupils are equal, round, and reactive to light. Right eye exhibits no discharge. Left eye exhibits no discharge. No scleral icterus.  Neck: Normal range of motion. Neck supple.  Cardiovascular: Normal rate and regular rhythm.   Pulmonary/Chest: Effort normal and breath sounds normal. No respiratory distress. She has no wheezes. She has no rales.  Lymphadenopathy:    She has no cervical adenopathy.  Skin: Skin is warm and dry. No rash noted. No erythema. No pallor.  Marland Kitchen5-1 cm area of erythema with what looks like an abrasion on posterior hard palate  Not tender Base is not white Some ecchymosis around it   Psychiatric: She has a normal mood and affect.          Assessment & Plan:

## 2014-01-18 NOTE — Assessment & Plan Note (Signed)
Resembles a traumatic abrasion at posterior hard palate at the midline -no ulcer like  Adv gargling with salt water and close obs If not resolved in 1-2 weeks will ref to ENT  Urged pt to call if any new symptoms

## 2014-01-18 NOTE — Patient Instructions (Signed)
The area in your throat looks like it came from trauma of some sort (? From candy or something crunchy)  I want you to gargle with warm salt water ( a teaspoon of salt to a cup of water is fine)- 2-3 times per day Keep an eye on it -if not improving in 1-2 weeks -or if worse please let me know   Also update me if you develop any other symptoms

## 2014-01-25 ENCOUNTER — Ambulatory Visit: Payer: Self-pay | Admitting: Ophthalmology

## 2014-01-25 LAB — POTASSIUM: Potassium: 4.4 mmol/L (ref 3.5–5.1)

## 2014-01-26 ENCOUNTER — Encounter: Payer: Self-pay | Admitting: Cardiology

## 2014-02-08 ENCOUNTER — Ambulatory Visit: Payer: Self-pay | Admitting: Ophthalmology

## 2014-03-16 ENCOUNTER — Encounter: Payer: Self-pay | Admitting: Internal Medicine

## 2014-03-16 ENCOUNTER — Telehealth: Payer: Self-pay

## 2014-03-16 ENCOUNTER — Ambulatory Visit (INDEPENDENT_AMBULATORY_CARE_PROVIDER_SITE_OTHER): Payer: Commercial Managed Care - HMO | Admitting: Internal Medicine

## 2014-03-16 VITALS — BP 130/64 | HR 64 | Temp 98.0°F | Wt 145.0 lb

## 2014-03-16 DIAGNOSIS — K625 Hemorrhage of anus and rectum: Secondary | ICD-10-CM

## 2014-03-16 NOTE — Patient Instructions (Addendum)
Constipation  Constipation is when a person has fewer than three bowel movements a week, has difficulty having a bowel movement, or has stools that are dry, hard, or larger than normal. As people grow older, constipation is more common. If you try to fix constipation with medicines that make you have a bowel movement (laxatives), the problem may get worse. Long-term laxative use may cause the muscles of the colon to become weak. A low-fiber diet, not taking in enough fluids, and taking certain medicines may make constipation worse.   CAUSES    Certain medicines, such as antidepressants, pain medicine, iron supplements, antacids, and water pills.    Certain diseases, such as diabetes, irritable bowel syndrome (IBS), thyroid disease, or depression.    Not drinking enough water.    Not eating enough fiber-rich foods.    Stress or travel.    Lack of physical activity or exercise.    Ignoring the urge to have a bowel movement.    Using laxatives too much.   SIGNS AND SYMPTOMS    Having fewer than three bowel movements a week.    Straining to have a bowel movement.    Having stools that are hard, dry, or larger than normal.    Feeling full or bloated.    Pain in the lower abdomen.    Not feeling relief after having a bowel movement.   DIAGNOSIS   Your health care provider will take a medical history and perform a physical exam. Further testing may be done for severe constipation. Some tests may include:   A barium enema X-ray to examine your rectum, colon, and, sometimes, your small intestine.    A sigmoidoscopy to examine your lower colon.    A colonoscopy to examine your entire colon.  TREATMENT   Treatment will depend on the severity of your constipation and what is causing it. Some dietary treatments include drinking more fluids and eating more fiber-rich foods. Lifestyle treatments may include regular exercise. If these diet and lifestyle recommendations do not help, your health care  provider may recommend taking over-the-counter laxative medicines to help you have bowel movements. Prescription medicines may be prescribed if over-the-counter medicines do not work.   HOME CARE INSTRUCTIONS    Eat foods that have a lot of fiber, such as fruits, vegetables, whole grains, and beans.   Limit foods high in fat and processed sugars, such as french fries, hamburgers, cookies, candies, and soda.    A fiber supplement may be added to your diet if you cannot get enough fiber from foods.    Drink enough fluids to keep your urine clear or pale yellow.    Exercise regularly or as directed by your health care provider.    Go to the restroom when you have the urge to go. Do not hold it.    Only take over-the-counter or prescription medicines as directed by your health care provider. Do not take other medicines for constipation without talking to your health care provider first.   SEEK IMMEDIATE MEDICAL CARE IF:    You have bright red blood in your stool.    Your constipation lasts for more than 4 days or gets worse.    You have abdominal or rectal pain.    You have thin, pencil-like stools.    You have unexplained weight loss.  MAKE SURE YOU:    Understand these instructions.   Will watch your condition.   Will get help right away if you are not   doing well or get worse.  Document Released: 05/10/2004 Document Revised: 08/17/2013 Document Reviewed: 05/24/2013  Exit[employer] Patient Information 2015 [employer]. This information is not intended to replace advice given to you by your health care provider. Make sure you discuss any questions you have with your health care provider.    Bloody Stools  Bloody stools often mean that there is a problem in the digestive tract. Your caregiver may use the term "melena" to describe black, tarry, and bad smelling stools or "hematochezia" to describe red or maroon-colored stools. Blood seen in the stool can be caused by bleeding anywhere along the  intestinal tract.   A black stool usually means that blood is coming from the upper part of the gastrointestinal tract (esophagus, stomach, or small bowel). Passing maroon-colored stools or bright red blood usually means that blood is coming from lower down in the large bowel or the rectum. However, sometimes massive bleeding in the stomach or small intestine can cause bright red bloody stools.   Consuming black licorice, lead, iron pills, medicines containing bismuth subsalicylate, or blueberries can also cause black stools. Your caregiver can test black stools to see if blood is present.  It is important that the cause of the bleeding be found. Treatment can then be started, and the problem can be corrected. Rectal bleeding may not be serious, but you should not assume everything is okay until you know the cause.It is very important to follow up with your caregiver or a specialist in gastrointestinal problems.  CAUSES   Blood in the stools can come from various underlying causes.Often, the cause is not found during your first visit. Testing is often needed to discover the cause of bleeding in the gastrointestinal tract. Causes range from simple to serious or even life-threatening.Possible causes include:   Hemorrhoids.These are veins that are full of blood (engorged) in the rectum. They cause pain, inflammation, and may bleed.   Anal fissures.These are areas of painful tearing which may bleed. They are often caused by passing hard stool.   Diverticulosis.These are pouches that form on the colon over time, with age, and may bleed significantly.   Diverticulitis.This is inflammation in areas with diverticulosis. It can cause pain, fever, and bloody stools, although bleeding is rare.   Proctitis and colitis. These are inflamed areas of the rectum or colon. They may cause pain, fever, and bloody stools.   Polyps and cancer. Colon cancer is a leading cause of preventable cancer death.It often starts out  as precancerous polyps that can be removed during a colonoscopy, preventing progression into cancer. Sometimes, polyps and cancer may cause rectal bleeding.   Gastritis and ulcers.Bleeding from the upper gastrointestinal tract (near the stomach) may travel through the intestines and produce black, sometimes tarry, often bad smelling stools. In certain cases, if the bleeding is fast enough, the stools may not be black, but red and the condition may be life-threatening.  SYMPTOMS   You may have stools that are bright red and bloody, that are normal color with blood on them, or that are dark black and tarry. In some cases, you may only have blood in the toilet bowl. Any of these cases need medical care. You may also have:   Pain at the anus or anywhere in the rectum.   Lightheadedness or feeling faint.   Extreme weakness.   Nausea or vomiting.   Fever.  DIAGNOSIS  Your caregiver may use the following methods to find the cause of your bleeding:     Taking a medical history. Age is important. Older people tend to develop polyps and cancer more often. If there is anal pain and a hard, large stool associated with bleeding, a tear of the anus may be the cause. If blood drips into the toilet after a bowel movement, bleeding hemorrhoids may be the problem. The color and frequency of the bleeding are additional considerations. In most cases, the medical history provides clues, but seldom the final answer.   A visual and finger (digital) exam. Your caregiver will inspect the anal area, looking for tears and hemorrhoids. A finger exam can provide information when there is tenderness or a growth inside. In men, the prostate is also examined.   Endoscopy. Several types of small, long scopes (endoscopes) are used to view the colon.   In the office, your caregiver may use a rigid, or more commonly, a flexible viewing sigmoidoscope. This exam is called flexible sigmoidoscopy. It is performed in 5 to 10 minutes.   A more  thorough exam is accomplished with a colonoscope. It allows your caregiver to view the entire 5 to 6 foot long colon. Medicine to help you relax (sedative) is usually given for this exam. Frequently, a bleeding lesion may be present beyond the reach of the sigmoidoscope. So, a colonoscopy may be the best exam to start with. Both exams are usually done on an outpatient basis. This means the patient does not stay overnight in the hospital or surgery center.   An upper endoscopy may be needed to examine your stomach. Sedation is used and a flexible endoscope is put in your mouth, down to your stomach.   A barium enema X-ray. This is an X-ray exam. It uses liquid barium inserted by enema into the rectum. This test alone may not identify an actual bleeding point. X-rays highlight abnormal shadows, such as those made by lumps (tumors), diverticuli, or colitis.  TREATMENT   Treatment depends on the cause of your bleeding.    For bleeding from the stomach or colon, the caregiver doing your endoscopy or colonoscopy may be able to stop the bleeding as part of the procedure.   Inflammation or infection of the colon can be treated with medicines.   Many rectal problems can be treated with creams, suppositories, or warm baths.   Surgery is sometimes needed.   Blood transfusions are sometimes needed if you have lost a lot of blood.   For any bleeding problem, let your caregiver know if you take aspirin or other blood thinners regularly.  HOME CARE INSTRUCTIONS    Take any medicines exactly as prescribed.   Keep your stools soft by eating a diet high in fiber. Prunes (1 to 3 a day) work well for many people.   Drink enough water and fluids to keep your urine clear or pale yellow.   Take sitz baths if advised. A sitz bath is when you sit in a bathtub with warm water for 10 to 15 minutes to soak, soothe, and cleanse the rectal area.   If enemas or suppositories are advised, be sure you know how to use them. Tell your  caregiver if you have problems with this.   Monitor your bowel movements to look for signs of improvement or worsening.  SEEK MEDICAL CARE IF:    You do not improve in the time expected.   Your condition worsens after initial improvement.   You develop any new symptoms.  SEEK IMMEDIATE MEDICAL CARE IF:    You develop   severe or prolonged rectal bleeding.   You vomit blood.   You feel weak or faint.   You have a fever.  MAKE SURE YOU:   Understand these instructions.   Will watch your condition.   Will get help right away if you are not doing well or get worse.  Document Released: 08/02/2002 Document Revised: 11/04/2011 Document Reviewed: 12/28/2010  [employer] Patient Information 2015 [employer]. This information is not intended to replace advice given to you by your health care provider. Make sure you discuss any questions you have with your health care provider.

## 2014-03-16 NOTE — Progress Notes (Signed)
Subjective:    Patient ID: [name] DingwallVirginia Y Nazaire, female    DOB: 02/04/1923, 80 y.o.   MRN: [id number]  HPI  Pt presents to the clinic today with c/o blood in her stool. This occurred only once today after having a bowel movement. She reports that she had been constipated and did have to strain with this bowel movement. She reports that she has not had any abdominal pain, or pain with passing stool. She has not noticed any blood in her stool. She reports that about 2 years ago she had a vein bleeding in her rectum that had to be fixed by GI.  Review of Systems      Past Medical History  Diagnosis Date  . Allergic rhinitis   . Anxiety   . Depression   . GERD (gastroesophageal reflux disease)   . Hypertension   . Osteopenia   . Bradycardia   . Hyperlipidemia   . Duodenal ulcer   . Other specified cardiac dysrhythmias(427.89)   . Diverticulosis   . GI bleed   . Atrial fibrillation   . Anemia associated with acute blood loss   . Diverticulosis     Current Outpatient Prescriptions  Medication Sig Dispense Refill  . cholecalciferol (VITAMIN D) 1000 UNITS tablet Take 2,000 Units by mouth daily.       . hydrochlorothiazide (HYDRODIURIL) 25 MG tablet Take 1 tablet (25 mg total) by mouth daily.  90 tablet  3  . metoprolol tartrate (LOPRESSOR) 25 MG tablet Take 1 tablet (25 mg total) by mouth 2 (two) times daily.  180 tablet  3  . Multiple Vitamin (MULTIVITAMIN) tablet Take 1 tablet by mouth daily.        Marland Kitchen. omeprazole (PRILOSEC) 20 MG capsule Take 1 capsule (20 mg total) by mouth daily.  90 capsule  3  . sertraline (ZOLOFT) 100 MG tablet Take 1 tablet (100 mg total) by mouth daily.  90 tablet  0   No current facility-administered medications for this visit.    Allergies  Allergen Reactions  . Ace Inhibitors     REACTION: cough  . Alendronate Sodium     REACTION: aches  . Avapro [Irbesartan]     hypotension  . Codeine     Dizzy and nauseated   . Dabigatran Etexilate Mesylate  Diarrhea    indigestion  . Nitrofurantoin     REACTION: rash  . Penicillins     REACTION: rash  . Prednisone Other (See Comments)    Extreme weakness    Family History  Problem Relation Age of Onset  . Heart attack Father   . Depression Mother   . Alzheimer's disease Mother   . Pneumonia Mother   . Heart disease Brother     hx of CABG    History   Social History  . Marital Status: Married    Spouse Name: N/A    Number of Children: N/A  . Years of Education: N/A   Occupational History  . Not on file.   Social History Main Topics  . Smoking status: Never Smoker   . Smokeless tobacco: Never Used  . Alcohol Use: Yes     Comment: infrequently-rare  . Drug Use: No  . Sexual Activity: Not on file   Other Topics Concern  . Not on file   Social History Narrative  . No narrative on file     Constitutional: Denies fever, malaise, fatigue, headache or abrupt weight changes.   Gastrointestinal: Pt reports blood  in her stool. Denies abdominal pain, bloating, constipation, diarrhea.     No other specific complaints in a complete review of systems (except as listed in HPI above).  Objective:   Physical Exam   BP 130/64  Pulse 64  Temp(Src) 98 F (36.7 C) (Oral)  Wt 145 lb (65.772 kg)  SpO2 96% Wt Readings from Last 3 Encounters:  03/16/14 145 lb (65.772 kg)  01/18/14 148 lb (67.132 kg)  01/10/14 148 lb (67.132 kg)    General: Appears their stated age, well developed, well nourished in NAD. Skin: Warm, dry and intact. No rashes, lesions or ulcerations noted. HEENT: Head: normal shape and size; Eyes: sclera white, no icterus, conjunctiva pink, PERRLA and EOMs intact; Ears: Tm's gray and intact, normal light reflex; Nose: mucosa pink and moist, septum midline; Throat/Mouth: Teeth present, mucosa pink and moist, no exudate, lesions or ulcerations noted.  Neck: Normal range of motion. Neck supple, trachea midline. No massses, lumps or thyromegaly present.    Cardiovascular: Normal rate and rhythm. S1,S2 noted.  No murmur, rubs or gallops noted. No JVD or BLE edema. No carotid bruits noted. Pulmonary/Chest: Normal effort and positive vesicular breath sounds. No respiratory distress. No wheezes, rales or ronchi noted.  Abdomen: Soft and nontender. Normal bowel sounds, no bruits noted. No distention or masses noted. Liver, spleen and kidneys non palpable. Rectal: No evidence of external hemorrhoid. Burgandy/red blood noted at rectal opening. Fair rectal tone with notable BRB on glove. Hemoccult positive.  BMET    Component Value Date/Time   NA 137 11/01/2013 0912   K 4.0 11/01/2013 0912   CL 97 11/01/2013 0912   CO2 33* 11/01/2013 0912   GLUCOSE 92 11/01/2013 0912   BUN 18 11/01/2013 0912   CREATININE 0.9 11/01/2013 0912   CALCIUM 10.0 11/01/2013 0912   GFRNONAA 47* 06/23/2013 1701   GFRAA 55* 06/23/2013 1701    Lipid Panel     Component Value Date/Time   CHOL 191 11/01/2013 0912   TRIG 125.0 11/01/2013 0912   HDL 52.20 11/01/2013 0912   CHOLHDL 4 11/01/2013 0912   VLDL 25.0 11/01/2013 0912   LDLCALC 114* 11/01/2013 0912    CBC    Component Value Date/Time   WBC 7.3 11/01/2013 0912   RBC 4.20 11/01/2013 0912   HGB 12.6 11/01/2013 0912   HCT 37.4 11/01/2013 0912   PLT 235.0 11/01/2013 0912   MCV 89.1 11/01/2013 0912   MCH 30.9 06/26/2013 0525   MCHC 33.8 11/01/2013 0912   RDW 16.3* 11/01/2013 0912   LYMPHSABS 2.0 11/01/2013 0912   MONOABS 0.7 11/01/2013 0912   EOSABS 0.3 11/01/2013 0912   BASOSABS 0.0 11/01/2013 0912    Hgb A1C No results found for this basename: HGBA1C        Assessment & Plan:   BRBPR:  Upon review of chart, Dr. 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 Will check CBC and CMET today Will refer urgently back to GI for further evaluation/treatment  If you notice increased blood, fatigue, weakness, chest pain, shortness of breath prior to GI appt, please go to ER immediately

## 2014-03-16 NOTE — Telephone Encounter (Signed)
Patient Information:  Caller Name: [name] [name]  Phone: [phone]  Patient: [name] [name], [name] [name]  Gender: Female  DOB: 10/25/1922  Age: 80 Years  PCP[facility]  Office Follow Up:  Does the office need to follow up with this patient?: No  Instructions For The Office: N/A  RN Note:  Triage RN spoke to office clinical staff who ok'ed appt for one hour from now in office. Pt agreed to plan.  Symptoms  Reason For Call & Symptoms: States red blood mixed in with brown stool today x 1 episode, after lunch. Does state she has had some mild constipation recently and had to strain to have BM but did not have any pain with passing BM and BM was soft and formed. States she felt weak this morning but "feels fine" now. Has been able to perform ADLs today w/o any difficulty. Hx of lower GI bleeding last year.  Reviewed Health History In EMR: Yes  Reviewed Medications In EMR: Yes  Reviewed Allergies In EMR: Yes  Reviewed Surgeries / Procedures: Yes  Date of Onset of Symptoms: 03/16/2014  Guideline(s) Used:  Rectal Bleeding  Disposition Per Guideline:   Go to ED Now (or to Office with PCP Approval)  Reason For Disposition Reached:   Bloody, black, or tarry bowel movements  Advice Given:  Call Back If:  You become worse.  Patient Will Follow Care Advice:  YES  Appointment Scheduled:  03/16/2014 16:15:00 Appointment Scheduled Provider:  Nicki ReaperBaity, Regina

## 2014-03-16 NOTE — Progress Notes (Signed)
Pre visit review using our clinic review tool, if applicable. No additional management support is needed unless otherwise documented below in the visit note. 

## 2014-03-16 NOTE — Telephone Encounter (Signed)
[name] [name] with [facility]; this AM felt weak but feels OK now; earlier this afternoon pt had to strain to have BM but there was bright red blood mixed with stool;no bleeding since then;pt was hospitalized last year due to intercolon bleeding. Scheduled appt with Nicki Reaperegina Baity NP today at 4:15 pm.

## 2014-03-17 LAB — CBC
HEMATOCRIT: 36.8 % (ref 36.0–46.0)
Hemoglobin: 12.3 g/dL (ref 12.0–15.0)
MCHC: 33.3 g/dL (ref 30.0–36.0)
MCV: 91.4 fl (ref 78.0–100.0)
Platelets: 294 10*3/uL (ref 150.0–400.0)
RBC: 4.02 Mil/uL (ref 3.87–5.11)
RDW: 14.8 % (ref 11.5–15.5)
WBC: 11.3 10*3/uL — AB (ref 4.0–10.5)

## 2014-03-17 LAB — COMPREHENSIVE METABOLIC PANEL
ALT: 17 U/L (ref 0–35)
AST: 28 U/L (ref 0–37)
Albumin: 4.4 g/dL (ref 3.5–5.2)
Alkaline Phosphatase: 72 U/L (ref 39–117)
BUN: 24 mg/dL — ABNORMAL HIGH (ref 6–23)
CO2: 32 meq/L (ref 19–32)
Calcium: 10 mg/dL (ref 8.4–10.5)
Chloride: 93 mEq/L — ABNORMAL LOW (ref 96–112)
Creatinine, Ser: 1.1 mg/dL (ref 0.4–1.2)
GFR: 51.92 mL/min — AB (ref >60.00)
Glucose, Bld: 86 mg/dL (ref 70–99)
Potassium: 4.3 mEq/L (ref 3.5–5.1)
Sodium: 132 mEq/L — ABNORMAL LOW (ref 135–145)
Total Bilirubin: 0.9 mg/dL (ref 0.2–1.2)
Total Protein: 7.5 g/dL (ref 6.0–8.3)

## 2014-03-24 ENCOUNTER — Ambulatory Visit (INDEPENDENT_AMBULATORY_CARE_PROVIDER_SITE_OTHER): Payer: Commercial Managed Care - HMO | Admitting: Gastroenterology

## 2014-03-24 ENCOUNTER — Encounter: Payer: Self-pay | Admitting: Gastroenterology

## 2014-03-24 ENCOUNTER — Telehealth: Payer: Self-pay | Admitting: Family Medicine

## 2014-03-24 VITALS — BP 132/90 | HR 72 | Ht 62.5 in | Wt 144.4 lb

## 2014-03-24 DIAGNOSIS — K625 Hemorrhage of anus and rectum: Secondary | ICD-10-CM

## 2014-03-24 DIAGNOSIS — Z8774 Personal history of (corrected) congenital malformations of heart and circulatory system: Secondary | ICD-10-CM | POA: Insufficient documentation

## 2014-03-24 DIAGNOSIS — Z8679 Personal history of other diseases of the circulatory system: Secondary | ICD-10-CM

## 2014-03-24 MED ORDER — TRAMADOL HCL 50 MG PO TABS: 50.0000 mg | ORAL_TABLET | Freq: Three times a day (TID) | ORAL | Status: DC | PRN

## 2014-03-24 NOTE — Telephone Encounter (Signed)
Medication phoned to pharmacy. Patient advised.  

## 2014-03-24 NOTE — Telephone Encounter (Signed)
Px written for call in  For tramadol- please use caution- like all pain meds this can cause dizziness and falls- have someone supervise her with it  Px written for call in   I wrote for # 90 but if they want less to start they can ask the pharmacist to give them a lesser amt

## 2014-03-24 NOTE — Progress Notes (Signed)
Agree 

## 2014-03-24 NOTE — Progress Notes (Signed)
     03/24/2014 [name] HerrlichVirginia Y [name] [id number] 07/28/1923   History of Present Illness:  This is a pleasant 80 year old female who is previously known to Dr. Marina GoodellPerry.  She had a GI bleeding in 05/2013.  She had a normal EGD.  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  She also had severe left sided diverticulosis with sigmoid stenosis.  She presents to our office today with her daughter due to recent issues of rectal bleeding.  She says that the blood was red in color.  It was occuring with bowel movements beginning last Wednesday.  It lasted until Sunday but she has not seen any further bleeding since that time.  She has been taking Aleve 2-3 times every day for left hip pain due to osteoarthritis.  Hgb on 7/22 was normal at 12.3 grams.  She says that she is too old for a hip replacement but the pain is constant.  Tylenol does not help.  Denies any other GI complaints.   Current Medications, Allergies, Past Medical History, Past Surgical History, Family History and Social History were reviewed in [facility] electronic medical record.   Physical Exam: BP 132/90  Pulse 72  Ht 5' 2.5" (1.588 m)  Wt 144 lb 6.4 oz (65.499 kg)  BMI 25.97 kg/m2 General: Well developed white female in no acute distress Head: Normocephalic and atraumatic Eyes:  Sclerae anicteric, conjunctiva pink  Ears: Normal auditory acuity Lungs: Clear throughout to auscultation Heart: Regular rate and rhythm Abdomen: Soft, non-distended.  Normal bowel sounds.  Non-tender. Rectal:  No external hemorrhoids noted.  No masses noted on DRE.  Trace light brown stool noted on exam glove without blood. Musculoskeletal: Symmetrical with no gross deformities  Extremities: No edema  Neurological: Alert oriented x 4, grossly non-focal Psychological:  Alert and cooperative. Normal mood and affect  Assessment and Recommendations: -Rectal bleeding:  Transient/self-limited.  May  have been due to hemorrhoid or low grade diverticular bleed, but has history of colonic AVMs as well. -History of bleeding colonic AVM requiring APC in 05/2013 -Use of NSAID's for hip osteoarthritis  *Bleeding has stopped at this point.  Recent Hgb normal.  Recommend that she just continue to monitor and discontinue use of NSAID's; she will speak with her PCP regarding other alternative for hip pain.

## 2014-03-24 NOTE — Telephone Encounter (Signed)
Lottie RaterKathy Kenny stopped by.  Ms Danh went to gi dr today.  She saws  the pa jessica d zehr for gi bleed.  Shanda BumpsJessica told them she thought it was from taking aleive.  [name] stated that Shanda Bumpsjessica suggested to see if dr tower would wright a rx for tramadol or ultram for hip pain. [facility] [address] [name] stated if no on answer you can leave message

## 2014-03-24 NOTE — Patient Instructions (Signed)
Please follow up as needed 

## 2014-03-25 NOTE — Telephone Encounter (Signed)
Pt notified of Dr. Tower's comments  

## 2014-03-25 NOTE — Telephone Encounter (Signed)
Pt called back and has decided not to pick up tramadol for hip pain due to possible dizziness and or fall. Pt will notify pharmacy she will not pick up at this time. Pt wanted Dr Milinda Antisower to be aware.

## 2014-03-25 NOTE — Telephone Encounter (Signed)
Thanks for letting me know that  If she changes her mind at any time alert me

## 2014-04-04 ENCOUNTER — Ambulatory Visit: Payer: Commercial Managed Care - HMO | Admitting: Family Medicine

## 2014-04-06 ENCOUNTER — Telehealth: Payer: Self-pay | Admitting: Family Medicine

## 2014-04-06 ENCOUNTER — Ambulatory Visit (INDEPENDENT_AMBULATORY_CARE_PROVIDER_SITE_OTHER): Payer: Commercial Managed Care - HMO | Admitting: Family Medicine

## 2014-04-06 ENCOUNTER — Encounter: Payer: Self-pay | Admitting: Family Medicine

## 2014-04-06 VITALS — BP 130/82 | HR 86 | Temp 98.5°F | Ht 62.5 in | Wt 144.2 lb

## 2014-04-06 DIAGNOSIS — R5381 Other malaise: Secondary | ICD-10-CM | POA: Insufficient documentation

## 2014-04-06 DIAGNOSIS — K625 Hemorrhage of anus and rectum: Secondary | ICD-10-CM

## 2014-04-06 DIAGNOSIS — I10 Essential (primary) hypertension: Secondary | ICD-10-CM

## 2014-04-06 DIAGNOSIS — M1610 Unilateral primary osteoarthritis, unspecified hip: Secondary | ICD-10-CM

## 2014-04-06 DIAGNOSIS — R5383 Other fatigue: Principal | ICD-10-CM

## 2014-04-06 LAB — CBC WITH DIFFERENTIAL/PLATELET
BASOS PCT: 0.8 % (ref 0.0–3.0)
Basophils Absolute: 0.1 10*3/uL (ref 0.0–0.1)
EOS PCT: 6.4 % — AB (ref 0.0–5.0)
Eosinophils Absolute: 0.5 10*3/uL (ref 0.0–0.7)
HEMATOCRIT: 35.2 % — AB (ref 36.0–46.0)
Hemoglobin: 11.7 g/dL — ABNORMAL LOW (ref 12.0–15.0)
LYMPHS ABS: 2.1 10*3/uL (ref 0.7–4.0)
Lymphocytes Relative: 25.3 % (ref 12.0–46.0)
MCHC: 33.3 g/dL (ref 30.0–36.0)
MCV: 91.3 fl (ref 78.0–100.0)
MONOS PCT: 10.2 % (ref 3.0–12.0)
Monocytes Absolute: 0.8 10*3/uL (ref 0.1–1.0)
NEUTROS ABS: 4.6 10*3/uL (ref 1.4–7.7)
Neutrophils Relative %: 57.3 % (ref 43.0–77.0)
Platelets: 351 10*3/uL (ref 150.0–400.0)
RBC: 3.86 Mil/uL — ABNORMAL LOW (ref 3.87–5.11)
RDW: 14.7 % (ref 11.5–15.5)
WBC: 8.1 10*3/uL (ref 4.0–10.5)

## 2014-04-06 LAB — COMPREHENSIVE METABOLIC PANEL
ALT: 17 U/L (ref 0–35)
AST: 28 U/L (ref 0–37)
Albumin: 4.1 g/dL (ref 3.5–5.2)
Alkaline Phosphatase: 81 U/L (ref 39–117)
BILIRUBIN TOTAL: 0.6 mg/dL (ref 0.2–1.2)
BUN: 20 mg/dL (ref 6–23)
CO2: 30 mEq/L (ref 19–32)
CREATININE: 1 mg/dL (ref 0.4–1.2)
Calcium: 10.1 mg/dL (ref 8.4–10.5)
Chloride: 96 mEq/L (ref 96–112)
GFR: 58.91 mL/min — AB (ref >60.00)
GLUCOSE: 92 mg/dL (ref 70–99)
Potassium: 4 mEq/L (ref 3.5–5.1)
Sodium: 134 mEq/L — ABNORMAL LOW (ref 135–145)
Total Protein: 7.3 g/dL (ref 6.0–8.3)

## 2014-04-06 LAB — TSH: TSH: 2.58 u[IU]/mL (ref 0.35–4.50)

## 2014-04-06 MED ORDER — TRAMADOL HCL 50 MG PO TABS: 50.0000 mg | ORAL_TABLET | Freq: Three times a day (TID) | ORAL | Status: DC | PRN

## 2014-04-06 NOTE — Assessment & Plan Note (Signed)
Resolved off nsaid Will continue to watch

## 2014-04-06 NOTE — Telephone Encounter (Signed)
Relevant patient education assigned to patient using Emmi. ° °

## 2014-04-06 NOTE — Progress Notes (Signed)
Subjective:    Patient ID: [name] [name], female    DOB: 05/19/1923, 80 y.o.   MRN: [id number]  HPI Here for fatigue and weakness (generalized)  She thinks it is due to chronic pain - has OA of the hip / will not replace it due to her age  ? If she could have an injection from orthopedics  Had to stop her nsaid due to rectal bleeding  Ultram was px - but she held off on it (has not tried it yet)   No more rectal bleeding since her GI visit (it was very brief)    She also had a cataract surgery-and that was tougher than she thought   No ha or fever   Office Visit on 03/16/2014  Component Date Value Ref Range Status  . WBC 03/16/2014 11.3* 4.0 - 10.5 K/uL Final  . RBC 03/16/2014 4.02  3.87 - 5.11 Mil/uL Final  . Platelets 03/16/2014 294.0  150.0 - 400.0 K/uL Final  . Hemoglobin 03/16/2014 12.3  12.0 - 15.0 g/dL Final  . HCT 56/21/3086 36.8  36.0 - 46.0 % Final  . MCV 03/16/2014 91.4  78.0 - 100.0 fl Final  . MCHC 03/16/2014 33.3  30.0 - 36.0 g/dL Final  . RDW 57/84/6962 14.8  11.5 - 15.5 % Final  . Sodium 03/16/2014 132* 135 - 145 mEq/L Final  . Potassium 03/16/2014 4.3  3.5 - 5.1 mEq/L Final  . Chloride 03/16/2014 93* 96 - 112 mEq/L Final  . CO2 03/16/2014 32  19 - 32 mEq/L Final  . Glucose, Bld 03/16/2014 86  70 - 99 mg/dL Final  . BUN 95/28/4132 24* 6 - 23 mg/dL Final  . Creatinine, Ser 03/16/2014 1.1  0.4 - 1.2 mg/dL Final  . Total Bilirubin 03/16/2014 0.9  0.2 - 1.2 mg/dL Final  . Alkaline Phosphatase 03/16/2014 72  39 - 117 U/L Final  . AST 03/16/2014 28  0 - 37 U/L Final  . ALT 03/16/2014 17  0 - 35 U/L Final  . Total Protein 03/16/2014 7.5  6.0 - 8.3 g/dL Final  . Albumin 44/08/270 4.4  3.5 - 5.2 g/dL Final  . Calcium 53/66/4403 10.0  8.4 - 10.5 mg/dL Final  . GFR 47/42/5956 51.92* >60.00 mL/min Final    bp is up a bit today  BP Readings from Last 3 Encounters:  04/06/14 150/96  03/24/14 132/90  03/16/14 130/64   she thinks it is due to the pain     Patient Active Problem List   Diagnosis Date Noted  . Other malaise and fatigue 04/06/2014  . Rectal bleeding 03/24/2014  . Personal history of arteriovenous malformation (AVM) 03/24/2014  . Lesion of throat 01/18/2014  . Left hip pain 01/10/2014  . Hip osteoarthritis 01/10/2014  . Osteopenia 11/11/2013  . Encounter for Medicare annual wellness exam 11/10/2013  . Hand pain, left 11/10/2013  . Fall due to stumbling 11/01/2013  . Contusion 11/01/2013  . Pacemaker -Medtronic 07/20/2013  . Angiodysplasia of colon with hemorrhage 06/25/2013  . Atrial fibrillation 06/06/2011  . ANXIETY 06/16/2007  . DEPRESSION 06/16/2007  . HYPERTENSION 06/16/2007  . GERD 06/16/2007   Past Medical History  Diagnosis Date  . Allergic rhinitis   . Anxiety   . Depression   . GERD (gastroesophageal reflux disease)   . Hypertension   . Osteopenia   . Bradycardia   . Hyperlipidemia   . Duodenal ulcer   . Other specified cardiac dysrhythmias(427.89)   . Diverticulosis   .  GI bleed   . Atrial fibrillation   . Anemia associated with acute blood loss   . Diverticulosis    Past Surgical History  Procedure Laterality Date  . Appendectomy    . Total abdominal hysterectomy    . Tonsillectomy    . Ankle fracture surgery  05/2002  . Insert / replace / remove pacemaker    . Cardiac defibrillator placement    . Esophagogastroduodenoscopy N/A 06/24/2013    Procedure: ESOPHAGOGASTRODUODENOSCOPY (EGD);  Surgeon: Hilarie Fredrickson, MD;  Location: [facility];  Service: Endoscopy;  Laterality: N/A;  . Colonoscopy N/A 06/25/2013    Procedure: COLONOSCOPY;  Surgeon: Hilarie Fredrickson, MD;  Location: [facility];  Service: Endoscopy;  Laterality: N/A;   History  Substance Use Topics  . Smoking status: Never Smoker   . Smokeless tobacco: Never Used  . Alcohol Use: Yes     Comment: infrequently-rare   Family History  Problem Relation Age of Onset  . Heart attack Father   . Depression Mother   . Alzheimer's  disease Mother   . Pneumonia Mother   . Heart disease Brother     hx of CABG   Allergies  Allergen Reactions  . Ace Inhibitors     REACTION: cough  . Alendronate Sodium     REACTION: aches  . Avapro [Irbesartan]     hypotension  . Codeine     Dizzy and nauseated   . Dabigatran Etexilate Mesylate Diarrhea    indigestion  . Nitrofurantoin     REACTION: rash  . Penicillins     REACTION: rash  . Prednisone Other (See Comments)    Extreme weakness   Current Outpatient Prescriptions on File Prior to Visit  Medication Sig Dispense Refill  . cholecalciferol (VITAMIN D) 1000 UNITS tablet Take 2,000 Units by mouth daily.       . hydrochlorothiazide (HYDRODIURIL) 25 MG tablet Take 1 tablet (25 mg total) by mouth daily.  90 tablet  3  . metoprolol tartrate (LOPRESSOR) 25 MG tablet Take 1 tablet (25 mg total) by mouth 2 (two) times daily.  180 tablet  3  . Multiple Vitamin (MULTIVITAMIN) tablet Take 1 tablet by mouth daily.        Marland Kitchen omeprazole (PRILOSEC) 20 MG capsule Take 1 capsule (20 mg total) by mouth daily.  90 capsule  3  . sertraline (ZOLOFT) 100 MG tablet Take 1 tablet (100 mg total) by mouth daily.  90 tablet  0   No current facility-administered medications on file prior to visit.    Review of Systems Review of Systems  Constitutional: Negative for fever, appetite change, and unexpected weight change. pos for fatigue  Eyes: Negative for pain and visual disturbance.  Respiratory: Negative for cough and shortness of breath.   Cardiovascular: Negative for cp or palpitations    Gastrointestinal: Negative for nausea, diarrhea and constipation. neg for blood in stool or dark stool Genitourinary: Negative for urgency and frequency. neg for dysuria or flank pain  Skin: Negative for pallor or rash   Neurological: Negative for weakness, light-headedness, numbness and headaches.  Hematological: Negative for adenopathy. Does not bruise/bleed easily.  Psychiatric/Behavioral: Negative  for dysphoric mood. The patient is occas  nervous/anxious.         Objective:   Physical Exam  Constitutional: She appears well-developed and well-nourished. No distress.  Well appearing elderly female - appears younger than stated age   HENT:  Head: Normocephalic and atraumatic.  Right Ear: External ear normal.  Left Ear: External ear normal.  Nose: Nose normal.  Mouth/Throat: Oropharynx is clear and moist.  Eyes: Conjunctivae and EOM are normal. Pupils are equal, round, and reactive to light. Right eye exhibits no discharge. Left eye exhibits no discharge. No scleral icterus.  Neck: Normal range of motion. Neck supple. No JVD present. No thyromegaly present.  Cardiovascular: Normal rate, regular rhythm, normal heart sounds and intact distal pulses.  Exam reveals no gallop.   Pulmonary/Chest: Effort normal and breath sounds normal. No respiratory distress. She has no wheezes. She has no rales.  Abdominal: Soft. Bowel sounds are normal. She exhibits no distension and no mass. There is no tenderness.  Musculoskeletal: She exhibits no edema and no tenderness.  Poor rom hips - worse on the L , with impaired gait  Lymphadenopathy:    She has no cervical adenopathy.  Neurological: She is alert. She has normal reflexes. No cranial nerve deficit. She exhibits normal muscle tone. Coordination normal.  Skin: Skin is warm and dry. No rash noted. No erythema. No pallor.  Psychiatric: She has a normal mood and affect.          Assessment & Plan:   Problem List Items Addressed This Visit     Cardiovascular and Mediastinum   HYPERTENSION - Primary     Improved on 2nd check today Per pt controlled at home (goes up with exertion and pain) BP: 130/82 mmHg   No changes in tx     Relevant Orders      CBC with Differential (Completed)      Comprehensive metabolic panel (Completed)      TSH (Completed)     Digestive   Rectal bleeding     Resolved off nsaid Will continue to watch       Musculoskeletal and Integument   Hip osteoarthritis     Having a hard time with pain -had to stop nsaid permanently after rectal bleeding Rev pros/cons of tramadol for pain  Understands that this can cause sedation and falls/ imbalance - promises to have family member stay with her while she tries it  Urged use of walker if necessary  Also urged f/u with ortho - per pt surgery is not an option at her age/ ? If other tx opt exist     Relevant Medications      traMADol (ULTRAM) 50 MG tablet     Other   Other malaise and fatigue     Poss multifactorial Per pt - main trigger is pain - from hip after having to stop nsaid  Also recent rectal bleeding - so will re check lab today (also incl tsh)  Will update  Hope for improved symptoms with pain control (will try tramadol with caution)    Relevant Orders      CBC with Differential (Completed)      Comprehensive metabolic panel (Completed)      TSH (Completed)

## 2014-04-06 NOTE — Assessment & Plan Note (Signed)
Poss multifactorial Per pt - main trigger is pain - from hip after having to stop nsaid  Also recent rectal bleeding - so will re check lab today (also incl tsh)  Will update  Hope for improved symptoms with pain control (will try tramadol with caution)

## 2014-04-06 NOTE — Assessment & Plan Note (Signed)
Having a hard time with pain -had to stop nsaid permanently after rectal bleeding Rev pros/cons of tramadol for pain  Understands that this can cause sedation and falls/ imbalance - promises to have family member stay with her while she tries it  Urged use of walker if necessary  Also urged f/u with ortho - per pt surgery is not an option at her age/ ? If other tx opt exist

## 2014-04-06 NOTE — Assessment & Plan Note (Signed)
Improved on 2nd check today Per pt controlled at home (goes up with exertion and pain) BP: 130/82 mmHg   No changes in tx

## 2014-04-06 NOTE — Progress Notes (Signed)
Pre visit review using our clinic review tool, if applicable. No additional management support is needed unless otherwise documented below in the visit note. 

## 2014-04-06 NOTE — Patient Instructions (Signed)
Labs today  If rectal bleeding starts again let us know Try tramadol for pain - with caution of sedation/ dizziness/ falls - have someone stay with you for the first few days on this  See your orthopedic doctor if this does not help If pain control does not help your fatigue-please let me know

## 2014-04-07 ENCOUNTER — Encounter: Payer: Self-pay | Admitting: *Deleted

## 2014-04-13 ENCOUNTER — Telehealth: Payer: Self-pay

## 2014-04-13 ENCOUNTER — Telehealth: Payer: Self-pay | Admitting: Family Medicine

## 2014-04-13 DIAGNOSIS — M160 Bilateral primary osteoarthritis of hip: Secondary | ICD-10-CM

## 2014-04-13 NOTE — Telephone Encounter (Signed)
Pt advised of Dr. Royden Purlower's comments and is okay with referral to ortho in [place], please put referral in and I advise pt Marion/Linda will call to schedule appt

## 2014-04-13 NOTE — Telephone Encounter (Signed)
Addressed through original phone note 

## 2014-04-13 NOTE — Telephone Encounter (Signed)
Referral done

## 2014-04-13 NOTE — Telephone Encounter (Signed)
I am interested in ref her to new ortho - just to evaluate her and see if she is a candidate for any non surgical opt for tx  Is she ok with [place]?  Let me know and I will refer

## 2014-04-13 NOTE — Addendum Note (Signed)
Addended by: Roxy MannsWER, MARNE A on: 04/13/2014 05:44 PM   Modules accepted: Orders

## 2014-04-13 NOTE — Telephone Encounter (Signed)
Called pt and phone was busy

## 2014-04-13 NOTE — Telephone Encounter (Signed)
Patient returning phone call from the office regarding her hip pain. She has not heard from the office yet.  Reviewed EPIC and Dr. Lucretia Roersowers note.  Patient states she is open to anything (i.e.second opinion) However, she is 80 years old and is not a candidate for hip replacement.  She would like to speak with staff. Advised to keep phone close and line open for return call. Please contact patient.  [phone]

## 2014-04-13 NOTE — Telephone Encounter (Signed)
Pt was seen 04/06/14; pt said she cannot take tramadol any more; does not help pts pain (pain level now 9) and it also constipates pt. Pt states she is in constant pain. Pt said last time she saw ortho; pt was too old for surgery and pt was to take Aleve; pt felt as though was dismissed [facility] request cb.

## 2014-04-13 NOTE — Telephone Encounter (Signed)
I think she has gone to [facility] in the past - would she be open to a 2nd opinion at a different clinic?

## 2014-04-13 NOTE — Telephone Encounter (Signed)
Patient missed phone call from the office. Please see office note.  Patient states that she is open to a second opinion, however she is 80 years old and is not a candidate for hip surgery.  Please contact. [place]- [phone]

## 2014-05-16 ENCOUNTER — Telehealth: Payer: Self-pay | Admitting: *Deleted

## 2014-05-16 NOTE — Telephone Encounter (Signed)
Pt had been c/o constipation, but yesterday she has a regular bm that had bright red blood in it. Pt was wondering if it could have been caused by one of her meds. She had a cortisone shot last week, but wasn't sure if that had anything to do with it. Has not noticed any blood in stool since. Please advise.

## 2014-05-16 NOTE — Telephone Encounter (Signed)
I doubt it was that .  I do not think she is on any nsaids or aspirin either.  Pain medications can cause constipation.  I advise getting a stool softener like colace otc if needed - if she was straining the blood could have been from a hemorrhoid In any case f/u if this continues (in light of her past history) or if she develops new symptoms

## 2014-05-16 NOTE — Telephone Encounter (Signed)
Pt advise me she was taking asa so she has since stopped taking it. Pt thinks the asa with the constipation caused the bleeding, pt said she will f/u with you if her sxs don't completely resolve but they are better

## 2014-05-20 ENCOUNTER — Ambulatory Visit: Payer: Commercial Managed Care - HMO | Admitting: Family Medicine

## 2014-05-20 ENCOUNTER — Ambulatory Visit (INDEPENDENT_AMBULATORY_CARE_PROVIDER_SITE_OTHER): Payer: Commercial Managed Care - HMO | Admitting: Family Medicine

## 2014-05-20 ENCOUNTER — Encounter: Payer: Self-pay | Admitting: Family Medicine

## 2014-05-20 VITALS — BP 110/66 | HR 68 | Temp 99.4°F | Ht 62.5 in | Wt 140.8 lb

## 2014-05-20 DIAGNOSIS — M1612 Unilateral primary osteoarthritis, left hip: Secondary | ICD-10-CM

## 2014-05-20 DIAGNOSIS — M1610 Unilateral primary osteoarthritis, unspecified hip: Secondary | ICD-10-CM

## 2014-05-20 DIAGNOSIS — Z23 Encounter for immunization: Secondary | ICD-10-CM

## 2014-05-20 DIAGNOSIS — K625 Hemorrhage of anus and rectum: Secondary | ICD-10-CM

## 2014-05-20 NOTE — Progress Notes (Signed)
Pre visit review using our clinic review tool, if applicable. No additional management support is needed unless otherwise documented below in the visit note. 

## 2014-05-20 NOTE — Patient Instructions (Signed)
I'm glad the rectal bleeding has slowed down Stay away from aspirin and other anti inflammatories  Get a stool softener (colace is a good brand - but store brand is fine) - you can take up to 2 pills twice daily - but see how much you need Drink lots of water ! If you take fiber - do the type you mix with water like metamucil and citrucel  A B complex vitamin over the counter is fine  It may be wise to look at some assisted living residences , or hire someone privately to help with housework

## 2014-05-20 NOTE — Progress Notes (Signed)
Subjective:    Patient ID: [name] [name], female    DOB: [birth year], 81 y.o.   MRN: [id number]  HPI Here for hip pain  Saw another orthopedic doctor - and an injection helped her hip pain for about a week  She takes tramadol for hip pain - ? If it really helps  She cannot do her housework any more due to pain and loss of energy    Is still having rectal bleeding Had visit with Rene Kocher in July  Sent to GI  They were not concerned   She was on some aspirin  Told to stop it  Since then -not a lot of blood  No blood on BM this am- just some on the paper  Sometimes she does strain to go to the bathroom   Once in a while she has a little abdominal discomfort  Had a bm wed/ until this am - not every day    She has taken fiber supplement  Has not tried a stool softener  Lab Results  Component Value Date   WBC 8.1 04/06/2014   HGB 11.7* 04/06/2014   HCT 35.2* 04/06/2014   MCV 91.3 04/06/2014   PLT 351.0 04/06/2014    Patient Active Problem List   Diagnosis Date Noted  . Other malaise and fatigue 04/06/2014  . Rectal bleeding 03/24/2014  . Personal history of arteriovenous malformation (AVM) 03/24/2014  . Lesion of throat 01/18/2014  . Left hip pain 01/10/2014  . Hip osteoarthritis 01/10/2014  . Osteopenia 11/11/2013  . Encounter for Medicare annual wellness exam 11/10/2013  . Hand pain, left 11/10/2013  . Fall due to stumbling 11/01/2013  . Contusion 11/01/2013  . Pacemaker -Medtronic 07/20/2013  . Angiodysplasia of colon with hemorrhage 06/25/2013  . Atrial fibrillation 06/06/2011  . ANXIETY 06/16/2007  . DEPRESSION 06/16/2007  . HYPERTENSION 06/16/2007  . GERD 06/16/2007   Past Medical History  Diagnosis Date  . Allergic rhinitis   . Anxiety   . Depression   . GERD (gastroesophageal reflux disease)   . Hypertension   . Osteopenia   . Bradycardia   . Hyperlipidemia   . Duodenal ulcer   . Other specified cardiac dysrhythmias(427.89)   . Diverticulosis    . GI bleed   . Atrial fibrillation   . Anemia associated with acute blood loss   . Diverticulosis    Past Surgical History  Procedure Laterality Date  . Appendectomy    . Total abdominal hysterectomy    . Tonsillectomy    . Ankle fracture surgery  05/2002  . Insert / replace / remove pacemaker    . Cardiac defibrillator placement    . Esophagogastroduodenoscopy N/A 06/24/2013    Procedure: ESOPHAGOGASTRODUODENOSCOPY (EGD);  Surgeon: Hilarie Fredrickson, MD;  Location: [facility];  Service: Endoscopy;  Laterality: N/A;  . Colonoscopy N/A 06/25/2013    Procedure: COLONOSCOPY;  Surgeon: Hilarie Fredrickson, MD;  Location: [facility];  Service: Endoscopy;  Laterality: N/A;   History  Substance Use Topics  . Smoking status: Never Smoker   . Smokeless tobacco: Never Used  . Alcohol Use: Yes     Comment: infrequently-rare   Family History  Problem Relation Age of Onset  . Heart attack Father   . Depression Mother   . Alzheimer's disease Mother   . Pneumonia Mother   . Heart disease Brother     hx of CABG   Allergies  Allergen Reactions  . Ace Inhibitors     REACTION:  cough  . Alendronate Sodium     REACTION: aches  . Avapro [Irbesartan]     hypotension  . Codeine     Dizzy and nauseated   . Dabigatran Etexilate Mesylate Diarrhea    indigestion  . Nitrofurantoin     REACTION: rash  . Penicillins     REACTION: rash  . Prednisone Other (See Comments)    Extreme weakness   Current Outpatient Prescriptions on File Prior to Visit  Medication Sig Dispense Refill  . cholecalciferol (VITAMIN D) 1000 UNITS tablet Take 2,000 Units by mouth daily.       . hydrochlorothiazide (HYDRODIURIL) 25 MG tablet Take 1 tablet (25 mg total) by mouth daily.  90 tablet  3  . metoprolol tartrate (LOPRESSOR) 25 MG tablet Take 1 tablet (25 mg total) by mouth 2 (two) times daily.  180 tablet  3  . Multiple Vitamin (MULTIVITAMIN) tablet Take 1 tablet by mouth daily.        Marland Kitchen omeprazole (PRILOSEC) 20 MG  capsule Take 1 capsule (20 mg total) by mouth daily.  90 capsule  3  . sertraline (ZOLOFT) 100 MG tablet Take 1 tablet (100 mg total) by mouth daily.  90 tablet  0  . traMADol (ULTRAM) 50 MG tablet Take 1 tablet (50 mg total) by mouth every 8 (eight) hours as needed for moderate pain (watch out for sedation or dizziness).  60 tablet  0   No current facility-administered medications on file prior to visit.     Review of Systems Review of Systems  Constitutional: Negative for fever, appetite change, fatigue and unexpected weight change.  Eyes: Negative for pain and visual disturbance.  Respiratory: Negative for cough and shortness of breath.   Cardiovascular: Negative for cp or palpitations    Gastrointestinal: Negative for nausea, diarrhea and pos for occasional constipation , pos for hemorrhoids, neg for abdominal pain  Genitourinary: Negative for urgency and frequency.  Skin: Negative for pallor or rash   MSK pos for L hip pain and immobility  Neurological: Negative for weakness, light-headedness, numbness and headaches.  Hematological: Negative for adenopathy. Does not bruise/bleed easily.  Psychiatric/Behavioral: Negative for dysphoric mood. The patient is not nervous/anxious.         Objective:   Physical Exam  Constitutional: She appears well-developed and well-nourished. No distress.  Frail appearing elderly female   HENT:  Head: Normocephalic and atraumatic.  Mouth/Throat: Oropharynx is clear and moist.  Eyes: Conjunctivae and EOM are normal. Pupils are equal, round, and reactive to light. No scleral icterus.  No conj pallor   Neck: Normal range of motion. Neck supple. No JVD present. Carotid bruit is not present. No thyromegaly present.  Cardiovascular: Normal rate, regular rhythm and intact distal pulses.  Exam reveals no gallop.   No murmur heard. Pulmonary/Chest: Effort normal and breath sounds normal. No respiratory distress. She has no wheezes. She has no rales.    Abdominal: Soft. Bowel sounds are normal. She exhibits no distension and no mass. There is no tenderness. There is no rebound and no guarding.  Musculoskeletal: She exhibits no edema.  Limited mobility of L hip  Walks with a cane slowly and independently  Lymphadenopathy:    She has no cervical adenopathy.  Neurological: She is alert. She has normal reflexes. No cranial nerve deficit. She exhibits normal muscle tone. Coordination normal.  Skin: Skin is warm and dry. No rash noted. No erythema. No pallor.  Psychiatric: She has a normal mood and affect.  Assessment & Plan:   Problem List Items Addressed This Visit     Digestive   Rectal bleeding     Rev recent visit with GI and this has slowed down since stopping aspirin In the past has had AVM- but this episode stable cbc and less blood consistent with hemorrhoids  Almost stopped Will update if this returns or if she develops other symptoms like abd pain        Musculoskeletal and Integument   Hip osteoarthritis - Primary     Ongoing L  Pt is not a surgical candidate for replacement due to age  Injection recently helped for just a week  She is concerned about performing household tasks but it not ready to move out of her home  Disc opt incl home care or hiring outside help  Will continue walker or cane and safety was disc in detail      Other Visit Diagnoses   Need for prophylactic vaccination and inoculation against influenza        Relevant Orders       Flu Vaccine QUAD 36+ mos IM (Completed)

## 2014-05-21 NOTE — Assessment & Plan Note (Signed)
Rev recent visit with GI and this has slowed down since stopping aspirin In the past has had AVM- but this episode stable cbc and less blood consistent with hemorrhoids  Almost stopped Will update if this returns or if she develops other symptoms like abd pain

## 2014-05-21 NOTE — Assessment & Plan Note (Signed)
Ongoing L  Pt is not a surgical candidate for replacement due to age  Injection recently helped for just a week  She is concerned about performing household tasks but it not ready to move out of her home  Disc opt incl home care or hiring outside help  Will continue walker or cane and safety was disc in detail

## 2014-05-31 ENCOUNTER — Ambulatory Visit: Payer: Self-pay | Admitting: Sports Medicine

## 2014-06-08 ENCOUNTER — Other Ambulatory Visit: Payer: Self-pay

## 2014-06-08 MED ORDER — TRAMADOL HCL 50 MG PO TABS: 50.0000 mg | ORAL_TABLET | Freq: Three times a day (TID) | ORAL | Status: DC | PRN

## 2014-06-08 NOTE — Telephone Encounter (Signed)
Rx called in as prescribed 

## 2014-06-08 NOTE — Telephone Encounter (Signed)
[facility]Please advise.

## 2014-06-08 NOTE — Telephone Encounter (Signed)
Px written for call in   

## 2014-07-19 ENCOUNTER — Ambulatory Visit (INDEPENDENT_AMBULATORY_CARE_PROVIDER_SITE_OTHER): Payer: Commercial Managed Care - HMO | Admitting: Internal Medicine

## 2014-07-19 ENCOUNTER — Telehealth: Payer: Self-pay | Admitting: Family Medicine

## 2014-07-19 ENCOUNTER — Encounter: Payer: Self-pay | Admitting: Internal Medicine

## 2014-07-19 VITALS — BP 160/110 | HR 68 | Ht 63.0 in | Wt 137.5 lb

## 2014-07-19 DIAGNOSIS — I4891 Unspecified atrial fibrillation: Secondary | ICD-10-CM

## 2014-07-19 DIAGNOSIS — R001 Bradycardia, unspecified: Secondary | ICD-10-CM

## 2014-07-19 LAB — MDC_IDC_ENUM_SESS_TYPE_INCLINIC
Battery Impedance: 1232 Ohm
Battery Voltage: 2.77 V
Brady Statistic AP VS Percent: 91 %
Brady Statistic AS VP Percent: 1 %
Brady Statistic AS VS Percent: 6 %
Date Time Interrogation Session: 20151124103320
Lead Channel Impedance Value: 410 Ohm
Lead Channel Impedance Value: 484 Ohm
Lead Channel Pacing Threshold Pulse Width: 0.4 ms
Lead Channel Pacing Threshold Pulse Width: 0.4 ms
Lead Channel Setting Pacing Amplitude: 1.5 V
Lead Channel Setting Sensing Sensitivity: 4 mV
MDC IDC MSMT BATTERY REMAINING LONGEVITY: 52 mo
MDC IDC MSMT LEADCHNL RA PACING THRESHOLD AMPLITUDE: 0.75 V
MDC IDC MSMT LEADCHNL RA SENSING INTR AMPL: 1.4 mV
MDC IDC MSMT LEADCHNL RV PACING THRESHOLD AMPLITUDE: 0.75 V
MDC IDC MSMT LEADCHNL RV SENSING INTR AMPL: 8 mV
MDC IDC SET LEADCHNL RV PACING AMPLITUDE: 2 V
MDC IDC SET LEADCHNL RV PACING PULSEWIDTH: 0.4 ms
MDC IDC STAT BRADY AP VP PERCENT: 2 %

## 2014-07-19 NOTE — Telephone Encounter (Signed)
Pt notified of Dr. Royden Purlower's comments. Pt said it's the pain that's bothering her more that her balance. Pt said she is using a regular cane right now but she would prefer a 4 prong cane over a walker

## 2014-07-19 NOTE — Patient Instructions (Signed)
Remote monitoring is used to monitor your Pacemaker of ICD from home. This monitoring reduces the number of office visits required to check your device to one time per year. It allows us to keep an eye on the functioning of your device to ensure it is working properly. You are scheduled for a device check from home on 10/20/14. You may send your transmission at any time that day. If you have a wireless device, the transmission will be sent automatically. After your physician reviews your transmission, you will receive a postcard with your next transmission date.   Your physician wants you to follow-up in: 1 year with Dr. Graciela HusbandsKlein. You will receive a reminder letter in the mail two months in advance. If you don't receive a letter, please call our office to schedule the follow-up appointment.

## 2014-07-19 NOTE — Telephone Encounter (Signed)
Please let pt /family know that I heard from Dr Graciela HusbandsKlein - re:  Mobility issues  If she has trouble with balance I recommend a walker/ if just pain in hip or knee would go with a 4 prong cane  Please ask which she would prefer and what she is using now  Thanks

## 2014-07-19 NOTE — Telephone Encounter (Signed)
Thanks -I put the order in IN box

## 2014-07-19 NOTE — Progress Notes (Signed)
Patient Care Team: Judy PimpleMarne A Tower, MD as PCP - General   HPI  [place] [name] is a 80 y.o. female Seen in followup for pacemaker implantation for tachybradycardia syndrome. She has a history of paroxysmal atrial fibrillation. She has been intolerant of multiple anticoagulants; She has had prior hospitalization for GI bleeding. Anticoagulation was discontinued. Her antihypertensives were also put on hold.   Her major problem now is hip pain.this has caused her some concern regarding balance and stability. She is requesting a walker.   Past Medical History  Diagnosis Date  . Allergic rhinitis   . Anxiety   . Depression   . GERD (gastroesophageal reflux disease)   . Hypertension   . Osteopenia   . Bradycardia   . Hyperlipidemia   . Duodenal ulcer   . Other specified cardiac dysrhythmias(427.89)   . Diverticulosis   . GI bleed   . Atrial fibrillation   . Anemia associated with acute blood loss   . Diverticulosis     Past Surgical History  Procedure Laterality Date  . Appendectomy    . Total abdominal hysterectomy    . Tonsillectomy    . Ankle fracture surgery  05/2002  . Insert / replace / remove pacemaker    . Cardiac defibrillator placement    . Esophagogastroduodenoscopy N/A 06/24/2013    Procedure: ESOPHAGOGASTRODUODENOSCOPY (EGD);  Surgeon: Hilarie FredricksonJohn N Perry, MD;  [facility];  Service: Endoscopy;  Laterality: N/A;  . Colonoscopy N/A 06/25/2013    Procedure: COLONOSCOPY;  Surgeon: Hilarie FredricksonJohn N Perry, MD;  Location: [facility];  Service: Endoscopy;  Laterality: N/A;    Current Outpatient Prescriptions  Medication Sig Dispense Refill  . cholecalciferol (VITAMIN D) 1000 UNITS tablet Take 2,000 Units by mouth daily.     . hydrochlorothiazide (HYDRODIURIL) 25 MG tablet Take 1 tablet (25 mg total) by mouth daily. 90 tablet 3  . HYDROcodone-acetaminophen (NORCO/VICODIN) 5-325 MG per tablet Take 1 tablet by mouth every 6 (six) hours as needed.   0  .  metoprolol tartrate (LOPRESSOR) 25 MG tablet Take 1 tablet (25 mg total) by mouth 2 (two) times daily. 180 tablet 3  . Multiple Vitamin (MULTIVITAMIN) tablet Take 1 tablet by mouth daily.      Marland Kitchen. omeprazole (PRILOSEC) 20 MG capsule Take 1 capsule (20 mg total) by mouth daily. 90 capsule 3  . sertraline (ZOLOFT) 100 MG tablet Take 1 tablet (100 mg total) by mouth daily. 90 tablet 0  . traMADol (ULTRAM) 50 MG tablet Take 1 tablet (50 mg total) by mouth every 8 (eight) hours as needed for moderate pain (watch out for sedation or dizziness). 60 tablet 2   No current facility-administered medications for this visit.    Allergies  Allergen Reactions  . Ace Inhibitors     REACTION: cough  . Alendronate Sodium     REACTION: aches  . Avapro [Irbesartan]     hypotension  . Codeine     Dizzy and nauseated   . Dabigatran Etexilate Mesylate Diarrhea    indigestion  . Nitrofurantoin     REACTION: rash  . Penicillins     REACTION: rash  . Prednisone Other (See Comments)    Extreme weakness    Review of Systems negative except from HPI and PMH  Physical Exam BP 160/110 mmHg  Pulse 68  Ht 5\' 3"  (1.6 m)  Wt 62.37 kg (137 lb 8 oz)  BMI 24.36 kg/m2 Device pocket well healed; without hematoma or  erythema.  There is no tethering  Well developed and nourished in no acute distress HENT normal Neck supple with JVP-flat Clear Device pocket well healed; without hematoma or erythema.  There is no tethering  Regular rate and rhythm, no murmurs or gallops Abd-soft with active BS No Clubbing cyanosis edema Skin-warm and dry A & Oriented  Grossly normal sensory and motor function   ECG demonstrates atrially paced rhythm at 68 Intervals  28/10/40  Assessment and  Plan  Sinus node dysfunction and chronotropic incompetence 100% atrial pacing  Atrial fibrillation no significant interval AF  Not on anticoagulation secondary to GI bleeding  Hypertension   Pacemaker Medtronic The patient's  device was interrogated.  The information was reviewed. No changes were made in the programming.     Gait instability  Her blood pressure is elevated today. At home she says it is normal. She has a history of likely hypertension she's also had significant pain related to her hip.  I sent a office know to Dr. Milinda Antisower to help her dress the issues of gait instability and the need for supplemental support

## 2014-07-20 ENCOUNTER — Telehealth: Payer: Self-pay | Admitting: Family Medicine

## 2014-07-20 NOTE — Telephone Encounter (Signed)
Called pt and advise her Dr. Milinda Antisower out of office until Monday. Pt said she has been seeing orthopedic doc for hip pain. I gave pt their phone # and she is going to give them a call to see if they can advise her on what to do about hip pain and see if they can give her an order for a walker sooner then Monday

## 2014-07-20 NOTE — Telephone Encounter (Signed)
Pt notified order ready for pick up 

## 2014-07-20 NOTE — Telephone Encounter (Signed)
Patient called and said she needs a walker not a cane.  Patient said she's having pain and she can't put her left foot down.

## 2014-07-22 NOTE — Telephone Encounter (Signed)
Let me know if she still needs this on monday

## 2014-07-25 NOTE — Telephone Encounter (Signed)
[name] [name] pt's daughter left v/m requesting a prescription for 4 wheel walker with padded seat that will fold up and prescription for 4 prong cane. Wants to pick up rxs on 07/26/14 in the morning. [name] [name] request cb.

## 2014-07-25 NOTE — Telephone Encounter (Signed)
Done and in IN box 

## 2014-07-25 NOTE — Telephone Encounter (Signed)
Daughter notified order ready for pick up 

## 2014-07-26 NOTE — Telephone Encounter (Signed)
[name] left v/m; when pt went to medical supply pt purchased a traveler 3 wheel walker;[name] request a new rx for traveler 3 wheel walker mailed to pts home address. Pt also having problem with insomnia and request med to help pt sleep; pt having problem going to sleep and staying asleep[facility]Please advise.

## 2014-07-26 NOTE — Telephone Encounter (Signed)
I wrote order for the walker-in IN box  The insomnia issue needs to be discussed further - re: safety concerns and choices  Please f/u for that

## 2014-07-27 NOTE — Telephone Encounter (Signed)
Order mailed per Daughter request, pt's daughter will check with pt and see what day works for her and call back to schedule an appt. to discuss her sleep problems

## 2014-08-02 ENCOUNTER — Encounter: Payer: Self-pay | Admitting: Radiology

## 2014-08-02 ENCOUNTER — Ambulatory Visit (INDEPENDENT_AMBULATORY_CARE_PROVIDER_SITE_OTHER): Payer: Commercial Managed Care - HMO | Admitting: Family Medicine

## 2014-08-02 ENCOUNTER — Encounter: Payer: Self-pay | Admitting: Family Medicine

## 2014-08-02 VITALS — BP 146/86 | HR 91 | Temp 98.2°F | Ht 63.0 in | Wt 135.0 lb

## 2014-08-02 DIAGNOSIS — F411 Generalized anxiety disorder: Secondary | ICD-10-CM

## 2014-08-02 DIAGNOSIS — R232 Flushing: Secondary | ICD-10-CM

## 2014-08-02 DIAGNOSIS — G4700 Insomnia, unspecified: Secondary | ICD-10-CM

## 2014-08-02 DIAGNOSIS — M25552 Pain in left hip: Secondary | ICD-10-CM

## 2014-08-02 DIAGNOSIS — N951 Menopausal and female climacteric states: Secondary | ICD-10-CM

## 2014-08-02 MED ORDER — SERTRALINE HCL 100 MG PO TABS: 100.0000 mg | ORAL_TABLET | Freq: Every day | ORAL | Status: DC

## 2014-08-02 NOTE — Progress Notes (Signed)
Subjective:    Patient ID: [name] [name], female    DOB: 12/20/1922, 80 y.o.   MRN: [id number]  HPI Here with insomnia  Partially from her pain  She falls asleep- but does not stay asleep - wakes up about every 2 hours - and it can take as long as 2 hours to go back to sleep  Gets about 6 hours of sleep per night   Also more anxious  Is nervous about having surgery and nervous about doctors appts  She takes 1/2 of her 100 mg of zoloft -- never went up on the dose   Also having hot flashes - on and off / lately it is worse  They come and go - and last maybe 10 minutes or so --- possible they happen when due for a dose of narcotic   Does not drink alcohol   Also chronic pain - even with the hydrocodone -- 1 pill three times per day (while awake)  Not taking tramadol  Her orthopedic doctor thinks she needs a hip replacement - she will talk about that   Patient Active Problem List   Diagnosis Date Noted  . Other malaise and fatigue 04/06/2014  . Rectal bleeding 03/24/2014  . Personal history of arteriovenous malformation (AVM) 03/24/2014  . Lesion of throat 01/18/2014  . Left hip pain 01/10/2014  . Hip osteoarthritis 01/10/2014  . Osteopenia 11/11/2013  . Encounter for Medicare annual wellness exam 11/10/2013  . Hand pain, left 11/10/2013  . Fall due to stumbling 11/01/2013  . Contusion 11/01/2013  . Pacemaker -Medtronic 07/20/2013  . Angiodysplasia of colon with hemorrhage 06/25/2013  . Atrial fibrillation 06/06/2011  . ANXIETY 06/16/2007  . DEPRESSION 06/16/2007  . HYPERTENSION 06/16/2007  . GERD 06/16/2007   Past Medical History  Diagnosis Date  . Allergic rhinitis   . Anxiety   . Depression   . GERD (gastroesophageal reflux disease)   . Hypertension   . Osteopenia   . Bradycardia   . Hyperlipidemia   . Duodenal ulcer   . Other specified cardiac dysrhythmias(427.89)   . Diverticulosis   . GI bleed   . Atrial fibrillation   . Anemia associated with  acute blood loss   . Diverticulosis    Past Surgical History  Procedure Laterality Date  . Appendectomy    . Total abdominal hysterectomy    . Tonsillectomy    . Ankle fracture surgery  05/2002  . Insert / replace / remove pacemaker    . Cardiac defibrillator placement    . Esophagogastroduodenoscopy N/A 06/24/2013    Procedure: ESOPHAGOGASTRODUODENOSCOPY (EGD);  Surgeon: Hilarie FredricksonJohn N Perry, MD;  [facility];  Service: Endoscopy;  Laterality: N/A;  . Colonoscopy N/A 06/25/2013    Procedure: COLONOSCOPY;  Surgeon: Hilarie FredricksonJohn N Perry, MD;  Location: [facility];  Service: Endoscopy;  Laterality: N/A;   History  Substance Use Topics  . Smoking status: Never Smoker   . Smokeless tobacco: Never Used  . Alcohol Use: 0.0 oz/week    0 Not specified per week     Comment: infrequently-rare   Family History  Problem Relation Age of Onset  . Heart attack Father   . Depression Mother   . Alzheimer's disease Mother   . Pneumonia Mother   . Heart disease Brother     hx of CABG   Allergies  Allergen Reactions  . Ace Inhibitors     REACTION: cough  . Alendronate Sodium     REACTION: aches  .  Avapro [Irbesartan]     hypotension  . Codeine     Dizzy and nauseated   . Dabigatran Etexilate Mesylate Diarrhea    indigestion  . Nitrofurantoin     REACTION: rash  . Penicillins     REACTION: rash  . Prednisone Other (See Comments)    Extreme weakness   Current Outpatient Prescriptions on File Prior to Visit  Medication Sig Dispense Refill  . cholecalciferol (VITAMIN D) 1000 UNITS tablet Take 2,000 Units by mouth daily.     . hydrochlorothiazide (HYDRODIURIL) 25 MG tablet Take 1 tablet (25 mg total) by mouth daily. 90 tablet 3  . HYDROcodone-acetaminophen (NORCO/VICODIN) 5-325 MG per tablet Take 1 tablet by mouth every 6 (six) hours as needed.   0  . metoprolol tartrate (LOPRESSOR) 25 MG tablet Take 1 tablet (25 mg total) by mouth 2 (two) times daily. 180 tablet 3  . Multiple Vitamin  (MULTIVITAMIN) tablet Take 1 tablet by mouth daily.      Marland Kitchen omeprazole (PRILOSEC) 20 MG capsule Take 1 capsule (20 mg total) by mouth daily. 90 capsule 3  . sertraline (ZOLOFT) 100 MG tablet Take 1 tablet (100 mg total) by mouth daily. 90 tablet 0  . traMADol (ULTRAM) 50 MG tablet Take 1 tablet (50 mg total) by mouth every 8 (eight) hours as needed for moderate pain (watch out for sedation or dizziness). 60 tablet 2   No current facility-administered medications on file prior to visit.       Review of Systems Review of Systems  Constitutional: Negative for fever, appetite change,  and unexpected weight change.  Eyes: Negative for pain and visual disturbance.  Respiratory: Negative for cough and shortness of breath.   Cardiovascular: Negative for cp or palpitations    Gastrointestinal: Negative for nausea, diarrhea and constipation.  Genitourinary: Negative for urgency and frequency.  Skin: Negative for pallor or rash   MSK pos for mod to severe hip pain with mobility impairment  Neurological: Negative for weakness, light-headedness, numbness and headaches.  Hematological: Negative for adenopathy. Does not bruise/bleed easily.  Psychiatric/Behavioral: Negative for dysphoric mood. The patient is nervous/anxious.         Objective:   Physical Exam  Constitutional: She appears well-developed and well-nourished. No distress.  Well appearing   HENT:  Head: Normocephalic and atraumatic.  Mouth/Throat: Oropharynx is clear and moist.  Eyes: Conjunctivae and EOM are normal. Pupils are equal, round, and reactive to light. No scleral icterus.  Neck: Normal range of motion. Neck supple. No JVD present. Carotid bruit is not present. No thyromegaly present.  Cardiovascular: Normal rate, regular rhythm, normal heart sounds and intact distal pulses.   Pulmonary/Chest: Effort normal and breath sounds normal. No respiratory distress. She has no wheezes. She has no rales.  Musculoskeletal: She  exhibits no edema.  Poor rom L hip with limited mobility  Lymphadenopathy:    She has no cervical adenopathy.  Neurological: She is alert. She has normal reflexes. No cranial nerve deficit. She exhibits normal muscle tone. Coordination normal.  Skin: Skin is warm and dry. No rash noted. No erythema. No pallor.  Psychiatric: Her speech is normal and behavior is normal. Thought content normal. Her mood appears anxious. Her affect is not blunt, not labile and not inappropriate. Thought content is not paranoid. Cognition and memory are normal. She does not exhibit a depressed mood. She expresses no homicidal and no suicidal ideation.          Assessment & Plan:  Problem List Items Addressed This Visit      Other   Anxiety state    Reviewed stressors/ coping techniques/symptoms/ support sources/ tx options and side effects in detail today  She is more anxious- this seems to affect sleep  Pain level increases anxiety  Rev stressors She is not taking full 100 mg of her sertraline -suggested inc this to 100- she is agreeable Offered counseling Will see how she does >25 minutes spent in face to face time with patient, >50% spent in counselling or coordination of care     Relevant Medications      sertraline (ZOLOFT) tablet   Hot flashes    Intermittent  ? If rel to anxiety/ menopause or the dosing of her pain med (in between) Will try to keep track of when she gets them  Rev last labs Report back if they continue after inc of sertraline     Insomnia - Primary    Multifactorial with age/anxiety and chronic pain Increase sertraline to 100 mg as tol- rev poss side eff in detail  Disc sleep hygeine Cut caffeine  If necessary- 2 wk after zoloft inc, can inc norco to 2 pills at night for pain control if needed   F/u 4-6wk  >25 minutes spent in face to face time with patient, >50% spent in counselling or coordination of care     Left hip pain    Pt has upcoming ortho appt to disc  hip repl Also using walker  She may need to inc her norco to 2 pills at night  Will see how she feels after inc sertraline first  Disc fall risk with inc in pain med

## 2014-08-02 NOTE — Progress Notes (Signed)
Pre visit review using our clinic review tool, if applicable. No additional management support is needed unless otherwise documented below in the visit note. 

## 2014-08-02 NOTE — Assessment & Plan Note (Signed)
Multifactorial with age/anxiety and chronic pain Increase sertraline to 100 mg as tol- rev poss side eff in detail  Disc sleep hygeine Cut caffeine  If necessary- 2 wk after zoloft inc, can inc norco to 2 pills at night for pain control if needed   F/u 4-6wk  >25 minutes spent in face to face time with patient, >50% spent in counselling or coordination of care

## 2014-08-02 NOTE — Patient Instructions (Addendum)
Increase sertraline to 100 mg daily (that is your zoloft) - alert me if any side effects This is for anxiety and depression and it will help your sleep  Give this about 2 weeks and if you are not sleeping better at that time - cautiously increase your night time pain dose of norco to 2 pills at bedtime  Keep walker by the bed so you do not fall  Also cut caffeine to one drink in am only (otherwise all decaf)   Follow up with me in 4-6 weeks

## 2014-08-03 NOTE — Assessment & Plan Note (Signed)
Intermittent  ? If rel to anxiety/ menopause or the dosing of her pain med (in between) Will try to keep track of when she gets them  Rev last labs Report back if they continue after inc of sertraline

## 2014-08-03 NOTE — Assessment & Plan Note (Signed)
Pt has upcoming ortho appt to disc hip repl Also using walker  She may need to inc her norco to 2 pills at night  Will see how she feels after inc sertraline first  Disc fall risk with inc in pain med

## 2014-08-03 NOTE — Assessment & Plan Note (Signed)
Reviewed stressors/ coping techniques/symptoms/ support sources/ tx options and side effects in detail today  She is more anxious- this seems to affect sleep  Pain level increases anxiety  Rev stressors She is not taking full 100 mg of her sertraline -suggested inc this to 100- she is agreeable Offered counseling Will see how she does >25 minutes spent in face to face time with patient, >50% spent in counselling or coordination of care

## 2014-08-09 ENCOUNTER — Encounter: Payer: Self-pay | Admitting: Internal Medicine

## 2014-08-16 ENCOUNTER — Encounter: Payer: Self-pay | Admitting: Family Medicine

## 2014-08-17 ENCOUNTER — Telehealth: Payer: Self-pay | Admitting: *Deleted

## 2014-08-17 NOTE — Telephone Encounter (Signed)
Faxed cardiac surgical clearance to [facility] for left total hip replacement.

## 2014-08-25 ENCOUNTER — Telehealth: Payer: Self-pay | Admitting: Family Medicine

## 2014-08-25 NOTE — Telephone Encounter (Signed)
[name] MessierKathy called  dr tower wrote a rx for a 3 wheel walker They need to get dr tower tax id number and the code for the walker  The hcpc code  Usually start with letter E and then 4 numbers She has diagnose code

## 2014-08-29 NOTE — Telephone Encounter (Signed)
My Tax ID is [id number] I do not know what that other code is -perhaps someone else does?

## 2014-08-31 ENCOUNTER — Ambulatory Visit: Payer: Commercial Managed Care - HMO | Admitting: Family Medicine

## 2014-08-31 NOTE — Telephone Encounter (Signed)
Daughter notified of tax ID # but will call company to ask what other code they need

## 2014-09-13 ENCOUNTER — Ambulatory Visit: Payer: Commercial Managed Care - HMO | Admitting: Family Medicine

## 2014-09-13 ENCOUNTER — Encounter: Payer: Self-pay | Admitting: Family Medicine

## 2014-09-13 ENCOUNTER — Ambulatory Visit (INDEPENDENT_AMBULATORY_CARE_PROVIDER_SITE_OTHER): Payer: Commercial Managed Care - HMO | Admitting: Family Medicine

## 2014-09-13 VITALS — BP 144/76 | HR 86 | Temp 98.0°F | Ht 63.0 in | Wt 131.0 lb

## 2014-09-13 DIAGNOSIS — I10 Essential (primary) hypertension: Secondary | ICD-10-CM

## 2014-09-13 DIAGNOSIS — Z01818 Encounter for other preprocedural examination: Secondary | ICD-10-CM

## 2014-09-13 MED ORDER — HYDROCODONE-ACETAMINOPHEN 5-325 MG PO TABS: 1.0000 | ORAL_TABLET | Freq: Four times a day (QID) | ORAL | Status: DC | PRN

## 2014-09-13 NOTE — Patient Instructions (Signed)
No medical restrictions for surgery  Stop taking aleve  Follow up with your cardiologist for a cardiac clearance as well

## 2014-09-13 NOTE — Assessment & Plan Note (Signed)
bp in fair control at this time  BP Readings from Last 1 Encounters:  09/13/14 144/76   No changes needed Disc lifstyle change with low sodium diet and exercise

## 2014-09-13 NOTE — Progress Notes (Signed)
Pre visit review using our clinic review tool, if applicable. No additional management support is needed unless otherwise documented below in the visit note. 

## 2014-09-13 NOTE — Progress Notes (Signed)
Subjective:    Patient ID: [name] [name], female    DOB: 11/16/1922, 81 y.o.   MRN: [id number]  HPI Getting ready for total hip replacement  Dr Eulah Pont  Does not have a surgery date yet   Will have to get clearance with cardiology  Has hx of a fib   bp is stable today  No cp or palpitations or headaches or edema  No side effects to medicines  BP Readings from Last 3 Encounters:  09/13/14 144/76  08/02/14 146/86  07/19/14 160/110     Having nausea in am - for 3 weeks - thinks this could be from her pain med  No abdominal pain  No blood in stool  No dark stool   Lab Results  Component Value Date   WBC 8.1 04/06/2014   HGB 11.7* 04/06/2014   HCT 35.2* 04/06/2014   MCV 91.3 04/06/2014   PLT 351.0 04/06/2014     Need to avoid macrobid and pcn due to allergy  Codeine due to other side effects   No problems with anethesia in the past  May do a spinal block this time - will discuss it / will likely go that way   Not on any blood thinners  No aspirin  Once in a while takes aleve -not often   Needs a refill of hydrocodone while she is here  Has a week left   Patient Active Problem List   Diagnosis Date Noted  . Insomnia 08/02/2014  . Hot flashes 08/02/2014  . Other malaise and fatigue 04/06/2014  . Rectal bleeding 03/24/2014  . Personal history of arteriovenous malformation (AVM) 03/24/2014  . Lesion of throat 01/18/2014  . Left hip pain 01/10/2014  . Hip osteoarthritis 01/10/2014  . Osteopenia 11/11/2013  . Encounter for Medicare annual wellness exam 11/10/2013  . Hand pain, left 11/10/2013  . Fall due to stumbling 11/01/2013  . Contusion 11/01/2013  . Pacemaker -Medtronic 07/20/2013  . Angiodysplasia of colon with hemorrhage 06/25/2013  . Atrial fibrillation 06/06/2011  . Anxiety state 06/16/2007  . DEPRESSION 06/16/2007  . HYPERTENSION 06/16/2007  . GERD 06/16/2007   Past Medical History  Diagnosis Date  . Allergic rhinitis   . Anxiety   .  Depression   . GERD (gastroesophageal reflux disease)   . Hypertension   . Osteopenia   . Bradycardia   . Hyperlipidemia   . Duodenal ulcer   . Other specified cardiac dysrhythmias(427.89)   . Diverticulosis   . GI bleed   . Atrial fibrillation   . Anemia associated with acute blood loss   . Diverticulosis    Past Surgical History  Procedure Laterality Date  . Appendectomy    . Total abdominal hysterectomy    . Tonsillectomy    . Ankle fracture surgery  05/2002  . Insert / replace / remove pacemaker    . Cardiac defibrillator placement    . Esophagogastroduodenoscopy N/A 06/24/2013    Procedure: ESOPHAGOGASTRODUODENOSCOPY (EGD);  Surgeon: Hilarie Fredrickson, MD;  Location: [facility];  Service: Endoscopy;  Laterality: N/A;  . Colonoscopy N/A 06/25/2013    Procedure: COLONOSCOPY;  Surgeon: Hilarie Fredrickson, MD;  Location: [facility];  Service: Endoscopy;  Laterality: N/A;   History  Substance Use Topics  . Smoking status: Never Smoker   . Smokeless tobacco: Never Used  . Alcohol Use: 0.0 oz/week    0 Not specified per week     Comment: infrequently-rare   Family History  Problem Relation Age  of Onset  . Heart attack Father   . Depression Mother   . Alzheimer's disease Mother   . Pneumonia Mother   . Heart disease Brother     hx of CABG   Allergies  Allergen Reactions  . Ace Inhibitors     REACTION: cough  . Alendronate Sodium     REACTION: aches  . Avapro [Irbesartan]     hypotension  . Codeine     Dizzy and nauseated   . Dabigatran Etexilate Mesylate Diarrhea    indigestion  . Nitrofurantoin     REACTION: rash  . Penicillins     REACTION: rash  . Prednisone Other (See Comments)    Extreme weakness   Current Outpatient Prescriptions on File Prior to Visit  Medication Sig Dispense Refill  . cholecalciferol (VITAMIN D) 1000 UNITS tablet Take 2,000 Units by mouth daily.     . hydrochlorothiazide (HYDRODIURIL) 25 MG tablet Take 1 tablet (25 mg total) by mouth  daily. 90 tablet 3  . HYDROcodone-acetaminophen (NORCO/VICODIN) 5-325 MG per tablet Take 1 tablet by mouth every 6 (six) hours as needed.   0  . metoprolol tartrate (LOPRESSOR) 25 MG tablet Take 1 tablet (25 mg total) by mouth 2 (two) times daily. 180 tablet 3  . Multiple Vitamin (MULTIVITAMIN) tablet Take 1 tablet by mouth daily.      Marland Kitchen. omeprazole (PRILOSEC) 20 MG capsule Take 1 capsule (20 mg total) by mouth daily. 90 capsule 3  . sertraline (ZOLOFT) 100 MG tablet Take 1 tablet (100 mg total) by mouth daily. 90 tablet 3   No current facility-administered medications on file prior to visit.    Review of Systems Review of Systems  Constitutional: Negative for fever, appetite change, fatigue and unexpected weight change.  Eyes: Negative for pain and visual disturbance.  Respiratory: Negative for cough and shortness of breath.   Cardiovascular: Negative for cp or palpitations    Gastrointestinal: Negative for , diarrhea and constipation. neg for abd pain or blood in stool  Genitourinary: Negative for urgency and frequency.  Skin: Negative for pallor or rash   Neurological: Negative for weakness, light-headedness, numbness and headaches.  Hematological: Negative for adenopathy. Does not bruise/bleed easily.  Psychiatric/Behavioral: Negative for dysphoric mood. The patient is not nervous/anxious.          Objective:   Physical Exam  Constitutional: She appears well-developed and well-nourished. No distress.  HENT:  Head: Normocephalic and atraumatic.  Nose: Nose normal.  Mouth/Throat: Oropharynx is clear and moist.  Eyes: Conjunctivae and EOM are normal. Pupils are equal, round, and reactive to light. Right eye exhibits no discharge. Left eye exhibits no discharge. No scleral icterus.  Neck: Normal range of motion. Neck supple. No JVD present. Carotid bruit is not present. No thyromegaly present.  Cardiovascular: Normal rate, regular rhythm, normal heart sounds and intact distal pulses.   Exam reveals no gallop.   Pulmonary/Chest: Effort normal and breath sounds normal. No respiratory distress. She has no wheezes. She has no rales.  Abdominal: Soft. Bowel sounds are normal. She exhibits no distension and no mass. There is no tenderness.  Musculoskeletal: She exhibits no edema or tenderness.  Poor rom of hips  Walks with a walker   Lymphadenopathy:    She has no cervical adenopathy.  Neurological: She is alert. She has normal reflexes. No cranial nerve deficit. She exhibits normal muscle tone. Coordination normal.  Skin: Skin is warm and dry. No rash noted. No erythema. No pallor.  Psychiatric: She has a normal mood and affect.          Assessment & Plan:   Problem List Items Addressed This Visit      Cardiovascular and Mediastinum   Essential hypertension    bp in fair control at this time  BP Readings from Last 1 Encounters:  09/13/14 144/76   No changes needed Disc lifstyle change with low sodium diet and exercise          Other   Preoperative examination - Primary    No medical restrictions for surgery  Refilled hydrocodone for pain control to get by until then  Will stop aleve  Will f/u with her cardiologist for cardiac clearance

## 2014-09-14 ENCOUNTER — Telehealth: Payer: Self-pay

## 2014-09-14 NOTE — Telephone Encounter (Signed)
[name] pts daughter left v/m; orthopedic office has to have medical clearance in hand before will schedule hip replacement surgery for 10/11/14 [facility] doctor. Pt was seen on 09/13/14.Please advise.

## 2014-09-14 NOTE — Assessment & Plan Note (Signed)
No medical restrictions for surgery  Refilled hydrocodone for pain control to get by until then  Will stop aleve  Will f/u with her cardiologist for cardiac clearance

## 2014-09-14 NOTE — Telephone Encounter (Signed)
Form and OV note faxed and pt's daughter notified

## 2014-09-14 NOTE — Telephone Encounter (Signed)
Note done and in IN box 

## 2014-09-16 NOTE — H&P (Signed)
  [name] [name]/[facility] [address]   [address] [place], [place] [id number] [phone] A Division of [facility]  [name] [name], M.D.   [name] [name], M.D.   [name] [name], M.D.   [name] [name], M.D.   Lunette StandsAnna [name], M.D [name] [name], M.D.  [name] DresserWesley R. [name], M.D.  [name] [name], M.D.    [name] [name], M.D. [name] L. Isidoro DonningAnton, PA-C  [name] A. Shepperson, PA-C  [name] [place], PA-C Union[place] [name], OPA-C   RE: [name] [name], [place]   [phone]      DOB: 02/06/1923 PROGRESS NOTE: 08-10-14 Reason for visit:  Evaluation left hip pain. History of present illness: [name] is an 81 year old who is physiologically quite healthy has had chronic left hip pain with atraumatic onset. She has previously been treated by Dr. Maurice SmallIbazebo with injections she has had anti-inflammatories has done activity modification but really has severe pain with every step. Her last injection only lasted 5 days but did give her complete relief. She recently had a CT scan which confirmed arthritis and ruled out fracture around the hip.   Please see associated documentation for this clinic visit for further past medical, family, surgical and social history, review of systems, and exam findings as this was reviewed by me.  EXAMINATION: Well appearing female in no apparent distress. She has severely limited range of motion of the left hip and an antalgic gait. She has good distal pulses. Skin is benign.   IMAGING: I reviewed previous x-rays which demonstrates severe arthritis of the left hip, well maintained joint space on the right side.  ASSESSMENT/PLAN: She has severe arthritis of the left hip. She has failed multiple attempts at conservative measures. We had a long talk about possibility of left total hip arthroplasty. I offered continued conservative treatment but she is interested in total hip arthroplasty. Chronologically she is on the older end of the  spectrum for this procedure however physiologically she seems relatively healthy she lives alone she takes care of herself. I would like to her to see her primary care physician for clearance. I think she may be a good candidate for spinal anesthesia given her previous cardiac history. She will meet with anesthesia as well.   [name] Baizeimothy D.  Eulah [name], M.D.  Electronically verified by [name] [name], M.D. TDM:kah Cc:  [name] BellingWesley Ibazebo, MD  Cc:  Roxy MannsMarne Tower, MD fax [phone]  D 08-10-14 T 08-12-14

## 2014-09-26 ENCOUNTER — Telehealth: Payer: Self-pay | Admitting: *Deleted

## 2014-09-26 NOTE — Telephone Encounter (Signed)
Patient's daughter left a voicemail stating that she is concerned about her mom's memory. [name] stated that her mom can not remember from day to day what she has done. [name] stated that her mom is scheduled to have surgery 10/11/14 and patient does not remember a lot about that. [name] wants to know if she should see a neurologist and if this should be done before or after the surgery? [name] wonders if the pain medication could be causing some of the memory issues?  [name] stated that her mom does not know that she is calling about this and she wants a call back.

## 2014-09-26 NOTE — Telephone Encounter (Signed)
Please have her f/u for a visit here with Ms [name] - let her know you are concerned and we can discuss the things that are happening   (there are lots of causes of memory problems including medicines) Lets start with that -I may start with some lab work first and review all the different factors that could contribute  Then if needed we will refer to neurology

## 2014-09-27 ENCOUNTER — Encounter (HOSPITAL_COMMUNITY)
Admission: RE | Admit: 2014-09-27 | Discharge: 2014-09-27 | Disposition: A | Discharge status: Home / Self Care | Payer: Commercial Managed Care - HMO | Source: Ambulatory Visit | Attending: Orthopedic Surgery | Admitting: Orthopedic Surgery

## 2014-09-27 ENCOUNTER — Encounter (HOSPITAL_COMMUNITY): Payer: Self-pay

## 2014-09-27 DIAGNOSIS — Z01812 Encounter for preprocedural laboratory examination: Secondary | ICD-10-CM | POA: Diagnosis present

## 2014-09-27 DIAGNOSIS — M1612 Unilateral primary osteoarthritis, left hip: Secondary | ICD-10-CM | POA: Diagnosis not present

## 2014-09-27 DIAGNOSIS — I10 Essential (primary) hypertension: Secondary | ICD-10-CM | POA: Insufficient documentation

## 2014-09-27 DIAGNOSIS — E785 Hyperlipidemia, unspecified: Secondary | ICD-10-CM | POA: Diagnosis not present

## 2014-09-27 DIAGNOSIS — Z950 Presence of cardiac pacemaker: Secondary | ICD-10-CM | POA: Insufficient documentation

## 2014-09-27 DIAGNOSIS — I4891 Unspecified atrial fibrillation: Secondary | ICD-10-CM | POA: Insufficient documentation

## 2014-09-27 LAB — BASIC METABOLIC PANEL
ANION GAP: 5 (ref 5–15)
BUN: 22 mg/dL (ref 6–23)
CO2: 33 mmol/L — AB (ref 19–32)
Calcium: 9.8 mg/dL (ref 8.4–10.5)
Chloride: 98 mmol/L (ref 96–112)
Creatinine, Ser: 0.95 mg/dL (ref 0.50–1.10)
GFR calc non Af Amer: 52 mL/min — ABNORMAL LOW (ref >90)
GFR, EST AFRICAN AMERICAN: 60 mL/min — AB (ref >90)
GLUCOSE: 101 mg/dL — AB (ref 70–99)
POTASSIUM: 3.7 mmol/L (ref 3.5–5.1)
SODIUM: 136 mmol/L (ref 135–145)

## 2014-09-27 LAB — CBC
HCT: 38.1 % (ref 36.0–46.0)
Hemoglobin: 12.6 g/dL (ref 12.0–15.0)
MCH: 31 pg (ref 26.0–34.0)
MCHC: 33.1 g/dL (ref 30.0–36.0)
MCV: 93.6 fL (ref 78.0–100.0)
Platelets: 290 10*3/uL (ref 150–400)
RBC: 4.07 MIL/uL (ref 3.87–5.11)
RDW: 15.1 % (ref 11.5–15.5)
WBC: 6.8 10*3/uL (ref 4.0–10.5)

## 2014-09-27 LAB — PROTIME-INR
INR: 1.06 (ref 0.00–1.49)
Prothrombin Time: 13.9 seconds (ref 11.6–15.2)

## 2014-09-27 LAB — SURGICAL PCR SCREEN
MRSA, PCR: NEGATIVE
Staphylococcus aureus: NEGATIVE

## 2014-09-27 LAB — APTT: aPTT: 32 seconds (ref 24–37)

## 2014-09-27 NOTE — Telephone Encounter (Signed)
Spoke to daughter. Made a follow up appt on 10/05/14 and she wants discuss the issue.

## 2014-09-27 NOTE — Progress Notes (Signed)
Anesthesia Chart Review:  Pt is 81 year old female scheduled for L total hip arthroplasty on 10/11/2014 with Dr. Wandra Feinstein. Murphy.   Cardiologist is Dr. Graciela HusbandsKlein. PCP is Dr. Roxy MannsMarne Tower.   PMH includes: atrial fibrillation, bradycardia, HTN, hyperlipidemia, pacemaker (Medtronic), sinus node dysfunction (100% atrial pacing)  Preoperative labs reviewed.    EKG 07/19/2014: Atrial rhythm. L axis - anterior fascicular block. Nonspecific T abnormality. Reviewed by Graciela HusbandsKlein as atrially paced rhythm.   Echo 12/30/2007: - Left ventricular size was at the upper limits of normal. Overall left ventricular systolic function was normal. Left ventricular ejection fraction was estimated , range being 55% to 60 %. There was no diagnostic evidence of left ventricular regional wall motion abnormalities. Left ventricular wall thickness was mildly increased. There was mild asymmetric septal hypertrophy. - There was mild aortic valvular regurgitation. The aortic regurgitation jet was central. The mean transaortic valve gradient was 4.7 mmHg. - The left atrium was mildly dilated. - The inferior vena cava was dilated. - There was a small pericardial effusion.  Pt has medical clearance from Dr. Milinda Antisower and cardiac clearance from Dr. Graciela HusbandsKlein for surgery.   Perioperative prescription for cardiac device form outstanding.   If no changes, I anticipate pt can proceed with surgery as scheduled.    Rica Mastngela Kabbe, FNP-BC [facility]hesiology Phone: [phone] 09/27/2014 3:09 PM

## 2014-09-27 NOTE — Pre-Procedure Instructions (Signed)
[name] [name]  09/27/2014   Your procedure is scheduled on:  Tuesday October 11, 2014 at 7:30 AM.  [facility] at 5:30 AM.  Call this number if you have problems the morning of surgery: [phone]  For any other questions Monday-Friday from 8am-4pm call: [phone]  Remember:   Do not eat food or drink liquids after midnight.   Take these medicines the morning of surgery with A SIP OF WATER: Hydrocodone if needed, Metoprolol (Lopressor), Omeprazole (Prilosec), and Sertraline (Zoloft)   Please stop taking any vitamins, Advil, Motrin, Alleve, Ibuprofen, etc on Tuesday February 9th   Do not wear jewelry, make-up or nail polish.  Do not wear lotions, powders, or perfumes.   Do not shave 48 hours prior to surgery.   Do not bring valuables to the hospital.  [facility] is not responsible for any belongings or valuables.               Contacts, dentures or bridgework may not be worn into surgery.  Leave suitcase in the car. After surgery it may be brought to your room.  For patients admitted to the hospital, discharge time is determined by your treatment team.               Patients discharged the day of surgery will not be allowed to drive home.  Name and phone number of your driver:   Special Instructions: Shower using CHG soap the night before and the morning of your surgery    Please read over the following fact sheets that you were given: Pain Booklet, Coughing and Deep Breathing, Total Joint Packet, MRSA Information and Surgical Site Infection Prevention

## 2014-09-27 NOTE — Progress Notes (Signed)
Patient arrived to PAT visit via wheelchair. Patient is Alert and oriented and denied having any acute cardiac or pulmonary issues. PCP is Target CorporationMarne Tower and Cardiologist is Sherryl MangesSteven Klein. Cardiac programming device sheet sent to Dr. Graciela HusbandsKlein.

## 2014-10-03 ENCOUNTER — Telehealth: Payer: Self-pay | Admitting: Family Medicine

## 2014-10-03 NOTE — Telephone Encounter (Signed)
Pt has an appt on Wednesday 2/10.  Her daughter [name] [name] is calling about the appointment.  [name] [name] states she wants to talk with Dr. Milinda Antisower before the appt.  The reason for the appt was supposed to be to talk about [name]'s memory, but [name] [name] is worried her mom will be upset by this.  She is wanting to know if this will be something Dr. Milinda Antisower will assess during the [facility] in April or not.  [name] [name] would like a call from Dr. Milinda Antisower to be sure she is doing the right thing.  [phone]  [name] [name]

## 2014-10-03 NOTE — Telephone Encounter (Signed)
I spoke to her daughter - now Ms [name] is aware we are going to discuss her memory on Wed and is ok with it

## 2014-10-05 ENCOUNTER — Ambulatory Visit (INDEPENDENT_AMBULATORY_CARE_PROVIDER_SITE_OTHER): Payer: Commercial Managed Care - HMO | Admitting: Family Medicine

## 2014-10-05 ENCOUNTER — Encounter: Payer: Self-pay | Admitting: Family Medicine

## 2014-10-05 VITALS — BP 148/82 | HR 66 | Temp 98.2°F | Ht 63.0 in | Wt 120.4 lb

## 2014-10-05 DIAGNOSIS — F411 Generalized anxiety disorder: Secondary | ICD-10-CM

## 2014-10-05 DIAGNOSIS — R5382 Chronic fatigue, unspecified: Secondary | ICD-10-CM

## 2014-10-05 DIAGNOSIS — E538 Deficiency of other specified B group vitamins: Secondary | ICD-10-CM | POA: Insufficient documentation

## 2014-10-05 DIAGNOSIS — R413 Other amnesia: Secondary | ICD-10-CM

## 2014-10-05 DIAGNOSIS — R5383 Other fatigue: Secondary | ICD-10-CM | POA: Insufficient documentation

## 2014-10-05 LAB — TSH: TSH: 2.65 u[IU]/mL (ref 0.35–4.50)

## 2014-10-05 LAB — VITAMIN B12: VITAMIN B 12: 289 pg/mL (ref 211–911)

## 2014-10-05 NOTE — Patient Instructions (Signed)
Labs today  Stop at check out for ref for CT of head   Follow up with me for a visit for memory test after recovery - either before or after your annual exam

## 2014-10-05 NOTE — Progress Notes (Signed)
Subjective:    Patient ID: [name] [name], female    DOB: 09/26/1922, 81 y.o.   MRN: [id number]  HPI Here today for memory problems   "can't think like she did"  Mostly short term memory  She will get a question - mind will go blank and then the answer will come to her later  Did not remember getting hair done the day after  Does not remember the prev meal often  Day to day things  Not misplacing things  Not forgetting familiar names  Not forgetting to eat   Wt is down from the last visit - family wonders if she forgets to eat  Some food in fridge is there for a while  Smaller portions   Not getting lost  occ gets confused about days  She does make herself a lot of notes and reminders Does not miss appts  Does not remember talking to the anesthesiologist re: her upcoming surgery   No falls Not dizzy  No headaches  Low on energy  Does not sleep well    Long term is ok    Her L hip pain is moving to her back   Is taking norco on a regular basis lately - that could affect memory   She does crossword puzzles daily  Does read books and articles - seems to comprehend  She balances checkbook- takes her longer  Family has not noticed issues with finances   Poss more depressed- is lonely   Patient Active Problem List   Diagnosis Date Noted  . Preoperative examination 09/13/2014  . Insomnia 08/02/2014  . Hot flashes 08/02/2014  . Other malaise and fatigue 04/06/2014  . Rectal bleeding 03/24/2014  . Personal history of arteriovenous malformation (AVM) 03/24/2014  . Lesion of throat 01/18/2014  . Left hip pain 01/10/2014  . Hip osteoarthritis 01/10/2014  . Osteopenia 11/11/2013  . Encounter for Medicare annual wellness exam 11/10/2013  . Hand pain, left 11/10/2013  . Fall due to stumbling 11/01/2013  . Contusion 11/01/2013  . Pacemaker -Medtronic 07/20/2013  . Angiodysplasia of colon with hemorrhage 06/25/2013  . Atrial fibrillation 06/06/2011  .  Anxiety state 06/16/2007  . DEPRESSION 06/16/2007  . Essential hypertension 06/16/2007  . GERD 06/16/2007   Past Medical History  Diagnosis Date  . Allergic rhinitis   . Anxiety   . Depression   . GERD (gastroesophageal reflux disease)   . Hypertension   . Osteopenia   . Bradycardia   . Hyperlipidemia   . Duodenal ulcer   . Other specified cardiac dysrhythmias(427.89)   . Diverticulosis   . GI bleed   . Atrial fibrillation   . Anemia associated with acute blood loss   . Diverticulosis   . Shortness of breath dyspnea     with exertion   Past Surgical History  Procedure Laterality Date  . Appendectomy    . Total abdominal hysterectomy    . Tonsillectomy    . Ankle fracture surgery Right 05/2002  . Insert / replace / remove pacemaker    . Cardiac defibrillator placement    . Esophagogastroduodenoscopy N/A 06/24/2013    Procedure: ESOPHAGOGASTRODUODENOSCOPY (EGD);  Surgeon: Irene Shipper, MD;  Location: [facility];  Service: Endoscopy;  Laterality: N/A;  . Colonoscopy N/A 06/25/2013    Procedure: COLONOSCOPY;  Surgeon: Irene Shipper, MD;  Location: [facility];  Service: Endoscopy;  Laterality: N/A;   History  Substance Use Topics  . Smoking status: Never Smoker   .  Smokeless tobacco: Never Used  . Alcohol Use: No     Comment: infrequently-rare   Family History  Problem Relation Age of Onset  . Heart attack Father   . Depression Mother   . Alzheimer's disease Mother   . Pneumonia Mother   . Heart disease Brother     hx of CABG   Allergies  Allergen Reactions  . Ace Inhibitors     REACTION: cough  . Alendronate Sodium     REACTION: aches  . Avapro [Irbesartan]     hypotension  . Codeine     Dizzy and nauseated   . Dabigatran Etexilate Mesylate Diarrhea    indigestion  . Nitrofurantoin     REACTION: rash  . Penicillins     REACTION: rash  . Prednisone Other (See Comments)    Extreme weakness   Current Outpatient Prescriptions on File Prior to Visit    Medication Sig Dispense Refill  . bisacodyl (DULCOLAX) 5 MG EC tablet Take 5 mg by mouth daily as needed for moderate constipation.    . calcium elemental as carbonate (BARIATRIC TUMS ULTRA) 400 MG tablet Chew 1,000 mg by mouth daily as needed for heartburn.    . cholecalciferol (VITAMIN D) 1000 UNITS tablet Take 2,000 Units by mouth daily.     Marland Kitchen docusate sodium (COLACE) 100 MG capsule Take 100 mg by mouth daily as needed for mild constipation.    . hydrochlorothiazide (HYDRODIURIL) 25 MG tablet Take 1 tablet (25 mg total) by mouth daily. 90 tablet 3  . HYDROcodone-acetaminophen (NORCO/VICODIN) 5-325 MG per tablet Take 1 tablet by mouth every 6 (six) hours as needed. 60 tablet 0  . metoprolol tartrate (LOPRESSOR) 25 MG tablet Take 1 tablet (25 mg total) by mouth 2 (two) times daily. 180 tablet 3  . Multiple Vitamin (MULTIVITAMIN) tablet Take 1 tablet by mouth daily.      . naproxen sodium (ANAPROX) 220 MG tablet Take 220 mg by mouth daily as needed (pain).    Marland Kitchen omeprazole (PRILOSEC) 20 MG capsule Take 1 capsule (20 mg total) by mouth daily. 90 capsule 3  . sertraline (ZOLOFT) 100 MG tablet Take 1 tablet (100 mg total) by mouth daily. 90 tablet 3   No current facility-administered medications on file prior to visit.     Review of Systems   Review of Systems  Constitutional: Negative for fever, appetite change, pos for  fatigue and unexpected weight change.  Eyes: Negative for pain and visual disturbance.  Respiratory: Negative for cough and shortness of breath.   Cardiovascular: Negative for cp or palpitations    Gastrointestinal: Negative for nausea, diarrhea and constipation.  Genitourinary: Negative for urgency and frequency.  Skin: Negative for pallor or rash   Neurological: Negative for weakness, light-headedness, numbness and headaches.  Hematological: Negative for adenopathy. Does not bruise/bleed easily.  Psychiatric/Behavioral: pos for baseline depression and anxiety       Objective:   Physical Exam  Constitutional: She is oriented to person, place, and time. She appears well-developed and well-nourished. No distress.  Elderly and somewhat frail appearing   HENT:  Head: Normocephalic and atraumatic.  Right Ear: External ear normal.  Left Ear: External ear normal.  Mouth/Throat: Oropharynx is clear and moist.  Eyes: Conjunctivae and EOM are normal. Pupils are equal, round, and reactive to light. No scleral icterus.  Neck: Normal range of motion. Neck supple. No JVD present. Carotid bruit is not present. No thyromegaly present.  Cardiovascular: Normal rate, normal heart sounds  and intact distal pulses.  Exam reveals no gallop.   Pulmonary/Chest: Effort normal and breath sounds normal. No respiratory distress. She has no wheezes. She has no rales.  No crackles  Abdominal: Soft. Bowel sounds are normal. She exhibits no distension and no mass. There is no tenderness.  Musculoskeletal: She exhibits no edema or tenderness.  Lymphadenopathy:    She has no cervical adenopathy.  Neurological: She is alert and oriented to person, place, and time. She has normal strength and normal reflexes. She displays no atrophy and no tremor. No cranial nerve deficit or sensory deficit. She exhibits normal muscle tone. Coordination normal.  Skin: Skin is warm and dry. No rash noted. No erythema. No pallor.  Psychiatric: She has a normal mood and affect.  Supportive daughter present  Pt is mildly anxious  Candid about symptoms, talkative and quite mentally sharp today          Assessment & Plan:   Problem List Items Addressed This Visit      Other   Anxiety state    This may be adding to memory issues  Disc treatment opt-she is already on ssri and xanax Should improve after surgery- very nervous about it  Also disc imp of socialization       Fatigue    Suspect multifactorial and due to chronic pain/pain meds  Also mood issues and memory issues  Lab today       Relevant Orders   TSH (Completed)   Vitamin B12 (Completed)   Memory loss - Primary    Long disc re : symptoms and potential causes of short term memory loss in 81 yo Disc with pt and her daughter  She agrees that there is a problem  Lab today to r/o organic cause (tsh/ B12)- will skip esr for now since she has bad arthritis Disc pain and pain med contributing as well as depression and anxiety Disc age rel memory loss and also dementia sched CT head no contrast -r/o mass/lesion/bleed/cva  >25 minutes spent in face to face time with patient, >50% spent in counselling or coordination of care   F/u after her surgery- will so MMS as well as disc poss nee dor medication       Relevant Orders   TSH (Completed)   Vitamin B12 (Completed)   CT Head Wo Contrast

## 2014-10-05 NOTE — Progress Notes (Signed)
Pre visit review using our clinic review tool, if applicable. No additional management support is needed unless otherwise documented below in the visit note. 

## 2014-10-06 ENCOUNTER — Ambulatory Visit: Payer: Self-pay | Admitting: Family Medicine

## 2014-10-06 ENCOUNTER — Telehealth: Payer: Self-pay | Admitting: Family Medicine

## 2014-10-06 NOTE — Assessment & Plan Note (Addendum)
Long disc re : symptoms and potential causes of short term memory loss in 81 yo Disc with pt and her daughter  She agrees that there is a problem  Lab today to r/o organic cause (tsh/ B12)- will skip esr for now since she has bad arthritis Disc pain and pain med contributing as well as depression and anxiety Disc age rel memory loss and also dementia sched CT head no contrast -r/o mass/lesion/bleed/cva  >25 minutes spent in face to face time with patient, >50% spent in counselling or coordination of care   F/u after her surgery- will so MMS as well as disc poss nee dor medication

## 2014-10-06 NOTE — Telephone Encounter (Signed)
Patient returned your call about her lab results.

## 2014-10-06 NOTE — Assessment & Plan Note (Signed)
Suspect multifactorial and due to chronic pain/pain meds  Also mood issues and memory issues  Lab today

## 2014-10-06 NOTE — Assessment & Plan Note (Signed)
This may be adding to memory issues  Disc treatment opt-she is already on ssri and xanax Should improve after surgery- very nervous about it  Also disc imp of socialization

## 2014-10-07 NOTE — Telephone Encounter (Signed)
Spoken to patient and notified of Dr Royden Purlower's comments in another phone note.

## 2014-10-10 ENCOUNTER — Telehealth: Payer: Self-pay | Admitting: *Deleted

## 2014-10-10 MED ORDER — ACETAMINOPHEN 500 MG PO TABS: 1000.0000 mg | ORAL_TABLET | Freq: Once | ORAL | Status: DC

## 2014-10-10 MED ORDER — CEFAZOLIN SODIUM-DEXTROSE 2-3 GM-% IV SOLR
2.0000 g | INTRAVENOUS | Status: AC
  Administered 2014-10-11: 2 g via INTRAVENOUS
  Filled 2014-10-10: qty 50

## 2014-10-10 MED ORDER — DEXTROSE-NACL 5-0.45 % IV SOLN: 100.0000 mL/h | INTRAVENOUS | Status: DC

## 2014-10-10 NOTE — Telephone Encounter (Signed)
Called and spoken to patient and daughter. Notified them of Dr Royden Purlower's comments that the CT is normal which is reassuring and will continue to eval after her surgery.

## 2014-10-10 NOTE — Anesthesia Preprocedure Evaluation (Signed)
Anesthesia Evaluation  Patient identified by MRN, date of birth, ID band Patient awake  General Assessment Comment:Allergic rhinitis   . Anxiety  . Depression  . GERD (gastroesophageal reflux disease)  . Hypertension  . Osteopenia  . Bradycardia  . Hyperlipidemia  . Duodenal ulcer  . Other specified cardiac dysrhythmias(427.89)  . Diverticulosis  . GI bleed  . Atrial fibrillation  . Anemia associated with acute blood loss  . Diverticulosis        Reviewed: Allergy & Precautions, NPO status , Patient's Chart, lab work & pertinent test results  History of Anesthesia Complications Negative for: history of anesthetic complications  Airway Mallampati: II  TM Distance: >3 FB Neck ROM: Full    Dental no notable dental hx. (+) Dental Advisory Given   Pulmonary neg pulmonary ROS,  breath sounds clear to auscultation  Pulmonary exam normal       Cardiovascular hypertension, Pt. on medications and Pt. on home beta blockers + dysrhythmias Atrial Fibrillation + pacemaker Rhythm:Regular Rate:Normal  Cleared by Dr. Graciela HusbandsKlein cardiology   Neuro/Psych PSYCHIATRIC DISORDERS Anxiety Depression negative neurological ROS  negative psych ROS   GI/Hepatic Neg liver ROS, PUD, GERD-  Medicated and Controlled,  Endo/Other  negative endocrine ROS  Renal/GU negative Renal ROS  negative genitourinary   Musculoskeletal  (+) Arthritis -, Osteoarthritis,    Abdominal   Peds negative pediatric ROS (+)  Hematology  (+) anemia ,   Anesthesia Other Findings   Reproductive/Obstetrics negative OB ROS                             Anesthesia Physical Anesthesia Plan  ASA: III  Anesthesia Plan: General   Post-op Pain Management:    Induction: Intravenous  Airway Management Planned: Oral ETT  Additional Equipment:   Intra-op Plan:   Post-operative Plan: Extubation  in OR  Informed Consent: I have reviewed the patients History and Physical, chart, labs and discussed the procedure including the risks, benefits and alternatives for the proposed anesthesia with the patient or authorized representative who has indicated his/her understanding and acceptance.   Dental advisory given  Plan Discussed with: CRNA  Anesthesia Plan Comments:         Anesthesia Quick Evaluation

## 2014-10-11 ENCOUNTER — Encounter (HOSPITAL_COMMUNITY)
Admission: RE | Disposition: A | Discharge status: Skilled Nursing Facility | Payer: Self-pay | Source: Ambulatory Visit | Attending: Orthopedic Surgery

## 2014-10-11 ENCOUNTER — Inpatient Hospital Stay (HOSPITAL_COMMUNITY): Payer: Commercial Managed Care - HMO | Admitting: Certified Registered Nurse Anesthetist

## 2014-10-11 ENCOUNTER — Encounter (HOSPITAL_COMMUNITY): Payer: Self-pay | Admitting: *Deleted

## 2014-10-11 ENCOUNTER — Inpatient Hospital Stay (HOSPITAL_COMMUNITY): Payer: Commercial Managed Care - HMO

## 2014-10-11 ENCOUNTER — Inpatient Hospital Stay (HOSPITAL_COMMUNITY): Payer: Commercial Managed Care - HMO | Admitting: Emergency Medicine

## 2014-10-11 ENCOUNTER — Inpatient Hospital Stay (HOSPITAL_COMMUNITY)
Admission: RE | Admit: 2014-10-11 | Discharge: 2014-10-12 | DRG: 470 | Disposition: A | Discharge status: Skilled Nursing Facility | Payer: Commercial Managed Care - HMO | Source: Ambulatory Visit | Attending: Orthopedic Surgery | Admitting: Orthopedic Surgery

## 2014-10-11 DIAGNOSIS — M1990 Unspecified osteoarthritis, unspecified site: Secondary | ICD-10-CM | POA: Diagnosis present

## 2014-10-11 DIAGNOSIS — F419 Anxiety disorder, unspecified: Secondary | ICD-10-CM | POA: Diagnosis present

## 2014-10-11 DIAGNOSIS — D649 Anemia, unspecified: Secondary | ICD-10-CM | POA: Diagnosis present

## 2014-10-11 DIAGNOSIS — M8580 Other specified disorders of bone density and structure, unspecified site: Secondary | ICD-10-CM | POA: Diagnosis present

## 2014-10-11 DIAGNOSIS — K219 Gastro-esophageal reflux disease without esophagitis: Secondary | ICD-10-CM | POA: Diagnosis present

## 2014-10-11 DIAGNOSIS — I4891 Unspecified atrial fibrillation: Secondary | ICD-10-CM | POA: Diagnosis present

## 2014-10-11 DIAGNOSIS — Z8711 Personal history of peptic ulcer disease: Secondary | ICD-10-CM

## 2014-10-11 DIAGNOSIS — I10 Essential (primary) hypertension: Secondary | ICD-10-CM | POA: Diagnosis present

## 2014-10-11 DIAGNOSIS — Z9889 Other specified postprocedural states: Secondary | ICD-10-CM

## 2014-10-11 DIAGNOSIS — E785 Hyperlipidemia, unspecified: Secondary | ICD-10-CM | POA: Diagnosis present

## 2014-10-11 DIAGNOSIS — M1612 Unilateral primary osteoarthritis, left hip: Secondary | ICD-10-CM | POA: Diagnosis present

## 2014-10-11 DIAGNOSIS — J309 Allergic rhinitis, unspecified: Secondary | ICD-10-CM | POA: Diagnosis present

## 2014-10-11 DIAGNOSIS — Z7982 Long term (current) use of aspirin: Secondary | ICD-10-CM | POA: Diagnosis not present

## 2014-10-11 DIAGNOSIS — F329 Major depressive disorder, single episode, unspecified: Secondary | ICD-10-CM | POA: Diagnosis present

## 2014-10-11 HISTORY — PX: TOTAL HIP ARTHROPLASTY: SHX124

## 2014-10-11 SURGERY — ARTHROPLASTY, HIP, TOTAL, ANTERIOR APPROACH
Anesthesia: General | Site: Hip | Laterality: Left

## 2014-10-11 MED ORDER — ARTIFICIAL TEARS OP OINT
TOPICAL_OINTMENT | OPHTHALMIC | Status: AC
  Filled 2014-10-11: qty 3.5

## 2014-10-11 MED ORDER — FENTANYL CITRATE 0.05 MG/ML IJ SOLN
INTRAMUSCULAR | Status: AC
  Filled 2014-10-11: qty 5

## 2014-10-11 MED ORDER — ONDANSETRON HCL 4 MG PO TABS
4.0000 mg | ORAL_TABLET | Freq: Four times a day (QID) | ORAL | Status: DC | PRN
  Administered 2014-10-12: 4 mg via ORAL
  Filled 2014-10-11: qty 1

## 2014-10-11 MED ORDER — PROPOFOL 10 MG/ML IV BOLUS
INTRAVENOUS | Status: AC
  Filled 2014-10-11: qty 20

## 2014-10-11 MED ORDER — PHENYLEPHRINE HCL 10 MG/ML IJ SOLN
10.0000 mg | INTRAVENOUS | Status: DC | PRN
  Administered 2014-10-11: 40 ug/min via INTRAVENOUS

## 2014-10-11 MED ORDER — PROPOFOL INFUSION 10 MG/ML OPTIME
INTRAVENOUS | Status: DC | PRN
  Administered 2014-10-11: 50 ug/kg/min via INTRAVENOUS

## 2014-10-11 MED ORDER — SUCCINYLCHOLINE CHLORIDE 20 MG/ML IJ SOLN
INTRAMUSCULAR | Status: AC
  Filled 2014-10-11: qty 1

## 2014-10-11 MED ORDER — EPHEDRINE SULFATE 50 MG/ML IJ SOLN
INTRAMUSCULAR | Status: AC
  Filled 2014-10-11: qty 1

## 2014-10-11 MED ORDER — ONDANSETRON HCL 4 MG/2ML IJ SOLN
INTRAMUSCULAR | Status: DC | PRN
  Administered 2014-10-11: 4 mg via INTRAVENOUS

## 2014-10-11 MED ORDER — BISACODYL 5 MG PO TBEC: 5.0000 mg | DELAYED_RELEASE_TABLET | Freq: Every day | ORAL | Status: DC | PRN

## 2014-10-11 MED ORDER — PHENOL 1.4 % MT LIQD: 1.0000 | OROMUCOSAL | Status: DC | PRN

## 2014-10-11 MED ORDER — METOCLOPRAMIDE HCL 5 MG/ML IJ SOLN: 5.0000 mg | Freq: Three times a day (TID) | INTRAMUSCULAR | Status: DC | PRN

## 2014-10-11 MED ORDER — ONDANSETRON HCL 4 MG PO TABS: 4.0000 mg | ORAL_TABLET | Freq: Three times a day (TID) | ORAL | Status: DC | PRN

## 2014-10-11 MED ORDER — PANTOPRAZOLE SODIUM 40 MG PO TBEC
40.0000 mg | DELAYED_RELEASE_TABLET | Freq: Every day | ORAL | Status: DC
  Administered 2014-10-11 – 2014-10-12 (×2): 40 mg via ORAL
  Filled 2014-10-11: qty 1

## 2014-10-11 MED ORDER — BUPIVACAINE HCL (PF) 0.5 % IJ SOLN
INTRAMUSCULAR | Status: DC | PRN
  Administered 2014-10-11: 2.5 mL

## 2014-10-11 MED ORDER — FENTANYL CITRATE 0.05 MG/ML IJ SOLN: 25.0000 ug | INTRAMUSCULAR | Status: DC | PRN

## 2014-10-11 MED ORDER — FENTANYL CITRATE 0.05 MG/ML IJ SOLN
INTRAMUSCULAR | Status: DC | PRN
  Administered 2014-10-11 (×4): 25 ug via INTRAVENOUS

## 2014-10-11 MED ORDER — DEXTROSE 5 % IV SOLN
500.0000 mg | Freq: Four times a day (QID) | INTRAVENOUS | Status: DC | PRN
  Filled 2014-10-11: qty 5

## 2014-10-11 MED ORDER — DOCUSATE SODIUM 100 MG PO CAPS: 100.0000 mg | ORAL_CAPSULE | Freq: Two times a day (BID) | ORAL | Status: DC

## 2014-10-11 MED ORDER — HYDROCODONE-ACETAMINOPHEN 5-325 MG PO TABS
1.0000 | ORAL_TABLET | ORAL | Status: DC | PRN
  Administered 2014-10-11 – 2014-10-12 (×5): 2 via ORAL
  Filled 2014-10-11 (×5): qty 2

## 2014-10-11 MED ORDER — CEFAZOLIN SODIUM-DEXTROSE 2-3 GM-% IV SOLR
2.0000 g | Freq: Four times a day (QID) | INTRAVENOUS | Status: AC
  Administered 2014-10-11 (×2): 2 g via INTRAVENOUS
  Filled 2014-10-11 (×2): qty 50

## 2014-10-11 MED ORDER — DOCUSATE SODIUM 100 MG PO CAPS: 100.0000 mg | ORAL_CAPSULE | Freq: Every day | ORAL | Status: DC | PRN

## 2014-10-11 MED ORDER — SODIUM CHLORIDE 0.9 % IV BOLUS (SEPSIS): 500.0000 mL | Freq: Once | INTRAVENOUS | Status: DC

## 2014-10-11 MED ORDER — ROCURONIUM BROMIDE 50 MG/5ML IV SOLN
INTRAVENOUS | Status: AC
  Filled 2014-10-11: qty 1

## 2014-10-11 MED ORDER — DEXTROSE-NACL 5-0.45 % IV SOLN
INTRAVENOUS | Status: DC
  Administered 2014-10-11 – 2014-10-12 (×2): via INTRAVENOUS

## 2014-10-11 MED ORDER — CELECOXIB 200 MG PO CAPS
200.0000 mg | ORAL_CAPSULE | Freq: Two times a day (BID) | ORAL | Status: DC
  Administered 2014-10-11 – 2014-10-12 (×3): 200 mg via ORAL
  Filled 2014-10-11 (×3): qty 1

## 2014-10-11 MED ORDER — METOCLOPRAMIDE HCL 10 MG PO TABS: 5.0000 mg | ORAL_TABLET | Freq: Three times a day (TID) | ORAL | Status: DC | PRN

## 2014-10-11 MED ORDER — ASPIRIN EC 325 MG PO TBEC: 325.0000 mg | DELAYED_RELEASE_TABLET | Freq: Every day | ORAL | Status: DC

## 2014-10-11 MED ORDER — ONDANSETRON HCL 4 MG/2ML IJ SOLN
4.0000 mg | Freq: Once | INTRAMUSCULAR | Status: DC | PRN
Start: 2014-10-11 — End: 2014-10-11

## 2014-10-11 MED ORDER — SODIUM CHLORIDE 0.9 % IR SOLN
Status: DC | PRN
  Administered 2014-10-11: 1000 mL

## 2014-10-11 MED ORDER — ACETAMINOPHEN 325 MG PO TABS: 650.0000 mg | ORAL_TABLET | Freq: Four times a day (QID) | ORAL | Status: DC | PRN

## 2014-10-11 MED ORDER — METHOCARBAMOL 500 MG PO TABS
500.0000 mg | ORAL_TABLET | Freq: Four times a day (QID) | ORAL | Status: DC | PRN
  Administered 2014-10-11 – 2014-10-12 (×2): 500 mg via ORAL
  Filled 2014-10-11 (×2): qty 1

## 2014-10-11 MED ORDER — LACTATED RINGERS IV SOLN
INTRAVENOUS | Status: DC | PRN
  Administered 2014-10-11 (×2): via INTRAVENOUS

## 2014-10-11 MED ORDER — SERTRALINE HCL 100 MG PO TABS
100.0000 mg | ORAL_TABLET | Freq: Every day | ORAL | Status: DC
Start: 2014-10-11 — End: 2014-10-12
  Administered 2014-10-12: 100 mg via ORAL
  Filled 2014-10-11: qty 1

## 2014-10-11 MED ORDER — SODIUM CHLORIDE 0.9 % IV SOLN
2000.0000 mg | INTRAVENOUS | Status: AC
  Administered 2014-10-11: 2000 mg via TOPICAL
  Filled 2014-10-11: qty 20

## 2014-10-11 MED ORDER — DOCUSATE SODIUM 100 MG PO CAPS
100.0000 mg | ORAL_CAPSULE | Freq: Two times a day (BID) | ORAL | Status: DC
  Administered 2014-10-11 – 2014-10-12 (×3): 100 mg via ORAL
  Filled 2014-10-11 (×3): qty 1

## 2014-10-11 MED ORDER — METOPROLOL TARTRATE 25 MG PO TABS: 25.0000 mg | ORAL_TABLET | Freq: Two times a day (BID) | ORAL | Status: DC

## 2014-10-11 MED ORDER — ONDANSETRON HCL 4 MG/2ML IJ SOLN
4.0000 mg | Freq: Four times a day (QID) | INTRAMUSCULAR | Status: DC | PRN
  Filled 2014-10-11: qty 2

## 2014-10-11 MED ORDER — ASPIRIN EC 325 MG PO TBEC
325.0000 mg | DELAYED_RELEASE_TABLET | Freq: Every day | ORAL | Status: DC
  Administered 2014-10-12: 325 mg via ORAL
  Filled 2014-10-11: qty 1

## 2014-10-11 MED ORDER — HYDROCODONE-ACETAMINOPHEN 5-325 MG PO TABS: 1.0000 | ORAL_TABLET | ORAL | Status: DC | PRN

## 2014-10-11 MED ORDER — MENTHOL 3 MG MT LOZG: 1.0000 | LOZENGE | OROMUCOSAL | Status: DC | PRN

## 2014-10-11 MED ORDER — LIDOCAINE HCL (CARDIAC) 20 MG/ML IV SOLN
INTRAVENOUS | Status: AC
  Filled 2014-10-11: qty 5

## 2014-10-11 MED ORDER — MORPHINE SULFATE 2 MG/ML IJ SOLN
2.0000 mg | INTRAMUSCULAR | Status: DC | PRN
  Administered 2014-10-11: 2 mg via INTRAVENOUS
  Filled 2014-10-11: qty 1

## 2014-10-11 MED ORDER — HYDROCHLOROTHIAZIDE 25 MG PO TABS: 25.0000 mg | ORAL_TABLET | Freq: Every day | ORAL | Status: DC

## 2014-10-11 MED ORDER — ONDANSETRON HCL 4 MG/2ML IJ SOLN
INTRAMUSCULAR | Status: AC
  Filled 2014-10-11: qty 2

## 2014-10-11 MED ORDER — ACETAMINOPHEN 650 MG RE SUPP: 650.0000 mg | Freq: Four times a day (QID) | RECTAL | Status: DC | PRN

## 2014-10-11 MED ORDER — DEXAMETHASONE SODIUM PHOSPHATE 10 MG/ML IJ SOLN
10.0000 mg | Freq: Once | INTRAMUSCULAR | Status: AC
  Administered 2014-10-12: 10 mg via INTRAVENOUS
  Filled 2014-10-11: qty 1

## 2014-10-11 SURGICAL SUPPLY — 65 items
ADH SKN CLS APL DERMABOND .7 (GAUZE/BANDAGES/DRESSINGS) ×1
BLADE SAW SGTL 18X1.27X75 (BLADE) ×2 IMPLANT
BLADE SAW SGTL 18X1.27X75MM (BLADE) ×1
BLADE SURG ROTATE 9660 (MISCELLANEOUS) IMPLANT
CAPT HIP TOTAL 2 ×3 IMPLANT
COVER SURGICAL LIGHT HANDLE (MISCELLANEOUS) ×3 IMPLANT
DERMABOND ADVANCED (GAUZE/BANDAGES/DRESSINGS) ×2
DERMABOND ADVANCED .7 DNX12 (GAUZE/BANDAGES/DRESSINGS) IMPLANT
DRAPE C-ARM 42X72 X-RAY (DRAPES) ×1 IMPLANT
DRAPE IMP U-DRAPE 54X76 (DRAPES) ×6 IMPLANT
DRAPE INCISE IOBAN 66X45 STRL (DRAPES) ×1 IMPLANT
DRAPE ORTHO SPLIT 77X108 STRL (DRAPES) ×6
DRAPE PROXIMA HALF (DRAPES) ×3 IMPLANT
DRAPE SURG 17X23 STRL (DRAPES) ×3 IMPLANT
DRAPE SURG ORHT 6 SPLT 77X108 (DRAPES) ×2 IMPLANT
DRAPE U-SHAPE 47X51 STRL (DRAPES) ×6 IMPLANT
DRSG AQUACEL AG ADV 3.5X10 (GAUZE/BANDAGES/DRESSINGS) ×3 IMPLANT
DRSG MEPILEX BORDER 4X8 (GAUZE/BANDAGES/DRESSINGS) ×2 IMPLANT
DURAPREP 26ML APPLICATOR (WOUND CARE) ×3 IMPLANT
ELECT BLADE 4.0 EZ CLEAN MEGAD (MISCELLANEOUS) ×3
ELECT CAUTERY BLADE 6.4 (BLADE) ×3 IMPLANT
ELECT REM PT RETURN 9FT ADLT (ELECTROSURGICAL) ×3
ELECTRODE BLDE 4.0 EZ CLN MEGD (MISCELLANEOUS) ×1 IMPLANT
ELECTRODE REM PT RTRN 9FT ADLT (ELECTROSURGICAL) ×1 IMPLANT
FACESHIELD WRAPAROUND (MASK) ×9 IMPLANT
GLOVE BIO SURGEON STRL SZ7.5 (GLOVE) ×5 IMPLANT
GLOVE BIOGEL M 7.0 STRL (GLOVE) IMPLANT
GLOVE BIOGEL PI IND STRL 6.5 (GLOVE) IMPLANT
GLOVE BIOGEL PI IND STRL 7.5 (GLOVE) IMPLANT
GLOVE BIOGEL PI IND STRL 8 (GLOVE) ×2 IMPLANT
GLOVE BIOGEL PI INDICATOR 6.5 (GLOVE) ×4
GLOVE BIOGEL PI INDICATOR 7.5 (GLOVE) ×2
GLOVE BIOGEL PI INDICATOR 8 (GLOVE) ×4
GLOVE BIOGEL PI ORTHO PRO SZ8 (GLOVE)
GLOVE PI ORTHO PRO STRL SZ8 (GLOVE) IMPLANT
GLOVE SURG ORTHO 8.0 STRL STRW (GLOVE) IMPLANT
GOWN STRL REUS W/ TWL LRG LVL3 (GOWN DISPOSABLE) ×3 IMPLANT
GOWN STRL REUS W/ TWL XL LVL3 (GOWN DISPOSABLE) ×1 IMPLANT
GOWN STRL REUS W/TWL LRG LVL3 (GOWN DISPOSABLE) ×9
GOWN STRL REUS W/TWL XL LVL3 (GOWN DISPOSABLE) ×3
KIT BASIN OR (CUSTOM PROCEDURE TRAY) ×3 IMPLANT
KIT ROOM TURNOVER OR (KITS) ×3 IMPLANT
MANIFOLD NEPTUNE II (INSTRUMENTS) ×3 IMPLANT
NDL 18GX1X1/2 (RX/OR ONLY) (NEEDLE) ×1 IMPLANT
NDL SAFETY ECLIPSE 18X1.5 (NEEDLE) ×1 IMPLANT
NEEDLE 18GX1X1/2 (RX/OR ONLY) (NEEDLE) IMPLANT
NEEDLE HYPO 18GX1.5 SHARP (NEEDLE)
NS IRRIG 1000ML POUR BTL (IV SOLUTION) ×3 IMPLANT
PACK TOTAL JOINT (CUSTOM PROCEDURE TRAY) ×3 IMPLANT
PACK UNIVERSAL I (CUSTOM PROCEDURE TRAY) IMPLANT
PAD ARMBOARD 7.5X6 YLW CONV (MISCELLANEOUS) ×6 IMPLANT
SPONGE LAP 18X18 X RAY DECT (DISPOSABLE) IMPLANT
SUT MNCRL AB 3-0 PS2 27 (SUTURE) ×3 IMPLANT
SUT MNCRL AB 4-0 PS2 18 (SUTURE) ×6 IMPLANT
SUT MON AB 2-0 CT1 36 (SUTURE) ×5 IMPLANT
SUT VIC AB 0 CT1 27 (SUTURE)
SUT VIC AB 0 CT1 27XBRD ANBCTR (SUTURE) ×1 IMPLANT
SUT VIC AB 1 CT1 27 (SUTURE)
SUT VIC AB 1 CT1 27XBRD ANBCTR (SUTURE) IMPLANT
SYR 50ML LL SCALE MARK (SYRINGE) ×1 IMPLANT
TOWEL OR 17X24 6PK STRL BLUE (TOWEL DISPOSABLE) ×3 IMPLANT
TOWEL OR 17X26 10 PK STRL BLUE (TOWEL DISPOSABLE) ×3 IMPLANT
TOWEL OR NON WOVEN STRL DISP B (DISPOSABLE) ×3 IMPLANT
TRAY FOLEY CATH 16FRSI W/METER (SET/KITS/TRAYS/PACK) IMPLANT
WATER STERILE IRR 1000ML POUR (IV SOLUTION) ×3 IMPLANT

## 2014-10-11 NOTE — Transfer of Care (Signed)
Immediate Anesthesia Transfer of Care Note  Patient: [name] [name]  Procedure(s) Performed: Procedure(s): TOTAL HIP ARTHROPLASTY ANTERIOR APPROACH (Left)  Patient Location: PACU  Anesthesia Type:Spinal  Level of Consciousness: awake and alert   Airway & Oxygen Therapy: Patient Spontanous Breathing  Post-op Assessment: Report given to RN and Post -op Vital signs reviewed and stable  Post vital signs: Reviewed and stable  Last Vitals:  Filed Vitals:   10/11/14 0618  BP: 162/86  Pulse: 64  Temp: 36.7 C  Resp: 18    Complications: No apparent anesthesia complications

## 2014-10-11 NOTE — Progress Notes (Signed)
Utilization review completed.  

## 2014-10-11 NOTE — Anesthesia Postprocedure Evaluation (Signed)
  Anesthesia Post-op Note  Patient: [name] [name]  Procedure(s) Performed: Procedure(s) (LRB): TOTAL HIP ARTHROPLASTY ANTERIOR APPROACH (Left)  Patient Location: PACU  Anesthesia Type: Spinal  Level of Consciousness: awake and alert   Airway and Oxygen Therapy: Patient Spontanous Breathing  Post-op Pain: mild  Post-op Assessment: Post-op Vital signs reviewed, Patient's Cardiovascular Status Stable, Respiratory Function Stable, Patent Airway and No signs of Nausea or vomiting  Last Vitals:  Filed Vitals:   10/11/14 1130  BP: 101/44  Pulse: 62  Temp:   Resp: 13    Post-op Vital Signs: stable   Complications: No apparent anesthesia complications

## 2014-10-11 NOTE — Anesthesia Procedure Notes (Signed)
Spinal Patient location during procedure: OR Start time: 10/11/2014 7:20 AM Staffing Anesthesiologist: Felipe DroneJUDD, Oakes Mccready JENNETTE Performed by: anesthesiologist  Preanesthetic Checklist Completed: patient identified, site marked, surgical consent, pre-op evaluation, timeout performed, IV checked, risks and benefits discussed and monitors and equipment checked Spinal Block Patient position: sitting Prep: Betadine Patient monitoring: heart rate, continuous pulse ox and blood pressure Location: L3-4 Injection technique: single-shot Needle Needle type: Quincke  Needle gauge: 22 G Needle length: 9 cm Assessment Sensory level: T4 Additional Notes Functioning IV was confirmed and monitors were applied. Sterile prep and drape, including hand hygiene, mask and sterile gloves were used. The patient was positioned and the spine was prepped. The skin was anesthetized with lidocaine.  Free flow of clear CSF was obtained prior to injecting local anesthetic into the CSF.  The spinal needle aspirated freely following injection.  The needle was carefully withdrawn.  The patient tolerated the procedure well. Consent was obtained prior to procedure with all questions answered and concerns addressed. Risks including but not limited to bleeding, infection, nerve damage, paralysis, failed block, inadequate analgesia, allergic reaction, high spinal, itching and headache were discussed and the patient wished to proceed.   Karie SchwalbeMary Lovelace Cerveny, MD

## 2014-10-11 NOTE — Progress Notes (Signed)
I spoke with her brother and family and they have a formal POA paperwork that has been completed. I have left it in their hands and we will copy and scan into the chart today.    Anie Juniel, D

## 2014-10-11 NOTE — Discharge Instructions (Signed)
Bear weight as tolerated ° °Keep incision covered and dry until follow up °

## 2014-10-11 NOTE — Evaluation (Signed)
Physical Therapy Evaluation Patient Details Name: [name] [name] MRN: [id number] DOB: May 14, 1923 Today's Date: 10/11/2014   History of Present Illness  81 y.o. female s/p direct anterior total hip arthroplasty.  Clinical Impression  Pt is s/p Left THA resulting in the deficits listed below (see PT Problem List). Limited by nausea today. Patient was able to transfer and ambulate up to 5 feet post op day #0 with min assist. Tolerated therapeutic exercises well.  Pt will benefit from skilled PT to increase their independence and safety with mobility to allow discharge to the venue listed below.      Follow Up Recommendations SNF    Equipment Recommendations  Rolling walker with 5" wheels;3in1 (PT)    Recommendations for Other Services OT consult     Precautions / Restrictions Precautions Precautions: Fall Restrictions Weight Bearing Restrictions: Yes LLE Weight Bearing: Weight bearing as tolerated Other Position/Activity Restrictions: Direct anterior - no hip precautions      Mobility  Bed Mobility Overal bed mobility: Needs Assistance Bed Mobility: Supine to Sit     Supine to sit: Min assist;HOB elevated     General bed mobility comments: Min assist for truncal support into seated position. Able to scoot to edge of bed independently.  Transfers Overall transfer level: Needs assistance Equipment used: Rolling walker (2 wheeled) Transfers: Sit to/from Stand Sit to Stand: Min assist         General transfer comment: Min assist for boost to stand from lowest bed setting. VC for hand placement. Performed x2, became nauseous on first attempt and required to sit.  Ambulation/Gait Ambulation/Gait assistance: Min assist Ambulation Distance (Feet): 5 Feet Assistive device: Rolling walker (2 wheeled) Gait Pattern/deviations: Step-to pattern;Decreased step length - right;Decreased stance time - left;Antalgic;Trunk flexed Gait velocity: decreased   General Gait  Details: Slow and guarded. Educated on safe DME use with a rolling walker. Able to bear majority of weight through LLE. Min assist for walker placement. No buckling noted.  Stairs            Wheelchair Mobility    Modified Rankin (Stroke Patients Only)       Balance Overall balance assessment: Needs assistance Sitting-balance support: No upper extremity supported;Feet supported Sitting balance-Leahy Scale: Good     Standing balance support: During functional activity;No upper extremity supported Standing balance-Leahy Scale: Fair Standing balance comment: Stands briefly without UE support prior to sitting                             Pertinent Vitals/Pain Pain Assessment: 0-10 Pain Score: 10-Worst pain ever Pain Location: Lt hip Pain Descriptors / Indicators: Aching Pain Intervention(s): Limited activity within patient's tolerance;Premedicated before session;Repositioned;Monitored during session    Home Living Family/patient expects to be discharged to:: Skilled nursing facility Living Arrangements: Alone Available Help at Discharge: Family (son lives next door) Type of Home: House Home Access: Stairs to enter Entrance Stairs-Rails: Right Entrance Stairs-Number of Steps: 4 Home Layout: One level Home Equipment: Cane - single point;Tub bench;Grab bars - toilet (3 wheel walker)      Prior Function Level of Independence: Independent with assistive device(s)         Comments: Used walker prior to surgery, occasionally used cane.     Hand Dominance   Dominant Hand: Right    Extremity/Trunk Assessment   Upper Extremity Assessment: Defer to OT evaluation           Lower Extremity Assessment:  LLE deficits/detail   LLE Deficits / Details: Decreased strength and ROM as expected post op.     Communication   Communication: No difficulties  Cognition Arousal/Alertness: Awake/alert Behavior During Therapy: WFL for tasks  assessed/performed Overall Cognitive Status: Within Functional Limits for tasks assessed                      General Comments General comments (skin integrity, edema, etc.): Pt became nauseous and lightheaded upon standing for the first time post op. Resolved upon sitting and was able to ambulate short distance immediately after. Still somewhat nauseated at end of therapy session. RN notfied.     Exercises General Exercises - Lower Extremity Ankle Circles/Pumps: AROM;Both;10 reps;Seated Long Arc Quad: Strengthening;Left;AROM;10 reps;Seated      Assessment/Plan    PT Assessment Patient needs continued PT services  PT Diagnosis Difficulty walking;Abnormality of gait;Acute pain   PT Problem List Decreased strength;Decreased range of motion;Decreased activity tolerance;Decreased balance;Decreased mobility;Decreased knowledge of use of DME;Pain  PT Treatment Interventions DME instruction;Gait training;Functional mobility training;Therapeutic activities;Therapeutic exercise;Balance training;Neuromuscular re-education;Patient/family education;Modalities   PT Goals (Current goals can be found in the Care Plan section) Acute Rehab PT Goals Patient Stated Goal: Go to SNF PT Goal Formulation: With patient Time For Goal Achievement: 10/25/14 Potential to Achieve Goals: Good    Frequency 7X/week   Barriers to discharge Decreased caregiver support lives alone    Co-evaluation               End of Session Equipment Utilized During Treatment: Gait belt Activity Tolerance: Patient tolerated treatment well Patient left: in chair;with call bell/phone within reach Nurse Communication: Mobility status         Time: 1520-1540 PT Time Calculation (min) (ACUTE ONLY): 20 min   Charges:   PT Evaluation $Initial PT Evaluation Tier I: 1 Procedure     PT G CodesBerton Mount:        Yunuen Mordan S 10/11/2014, 4:50 PM Sunday SpillersLogan Secor Butler[place], [place]PT [phone]

## 2014-10-11 NOTE — Interval H&P Note (Signed)
History and Physical Interval Note:  10/11/2014 7:15 AM  [name] [name]  has presented today for surgery, with the diagnosis of OA LEFT HIP  The various methods of treatment have been discussed with the patient and family. After consideration of risks, benefits and other options for treatment, the patient has consented to  Procedure(s): TOTAL HIP ARTHROPLASTY ANTERIOR APPROACH (Left) as a surgical intervention .  The patient's history has been reviewed, patient examined, no change in status, stable for surgery.  I have reviewed the patient's chart and labs.  Questions were answered to the patient's satisfaction.     Mairen Wallenstein, D

## 2014-10-11 NOTE — Op Note (Signed)
10/11/2014  8:58 AM  PATIENT:  [name] [name]   MRN: [id number]  PRE-OPERATIVE DIAGNOSIS:  OA LEFT HIP  POST-OPERATIVE DIAGNOSIS:  OA LEFT HIP  PROCEDURE:  Procedure(s): TOTAL HIP ARTHROPLASTY ANTERIOR APPROACH  PREOPERATIVE INDICATIONS:    [name] is an 81 y.o. female who has a diagnosis of <principal problem not specified> and elected for surgical management after failing conservative treatment.  The risks benefits and alternatives were discussed with the patient including but not limited to the risks of nonoperative treatment, versus surgical intervention including infection, bleeding, nerve injury, periprosthetic fracture, the need for revision surgery, dislocation, leg length discrepancy, blood clots, cardiopulmonary complications, morbidity, mortality, among others, and they were willing to proceed.     OPERATIVE REPORT     SURGEON:   Renette Butters, MD    ASSISTANT:  Lovett Calender, PA-C, She was present and scrubbed throughout the case, critical for completion in a timely fashion, and for retraction, instrumentation, and closure.     ANESTHESIA:  General    COMPLICATIONS:  None.     COMPONENTS:  Stryker acolade fit femur size 3 with a 36 mm +2.5 head ball and a PSL acetabular shell size 50 with a  polyethylene liner    PROCEDURE IN DETAIL:   The patient was met in the holding area and  identified.  The appropriate hip was identified and marked at the operative site.  The patient was then transported to the OR  and  placed under general anesthesia.  At that point, the patient was  placed in the supine position and  secured to the operating room table and all bony prominences padded. He received pre-operative antibiotics    The operative lower extremity was prepped from the iliac crest to the distal leg.  Sterile draping was performed.  Time out was performed prior to incision.      Skin incision was made just 2 cm lateral to the ASIS  extending in  line with the tensor fascia lata. Electrocautery was used to control all bleeders. I dissected down sharply to the fascia of the tensor fascia lata was confirmed that the muscle fibers beneath were running posteriorly. I then incised the fascia over the superficial tensor fascia lata in line with the incision. The fascia was elevated off the anterior aspect of the muscle the muscle was retracted posteriorly and protected throughout the case. I then used electrocautery to incise the tensor fascia lata fascia control and all bleeders. Immediately visible was the fat over top of the anterior neck and capsule.  I removed the anterior fat from the capsule and elevated the rectus muscle off of the anterior capsule. I then removed a large time of capsule. The retractors were then placed over the anterior acetabulum as well as around the superior and inferior neck.  I then removed a section of the femoral neck and a napkin ring fashion. Then used the power course to remove the femoral head from the acetabulum and thoroughly irrigated the acetabulum. I sized the femoral head.    I then exposed the deep acetabulum, cleared out any tissue including the ligamentum teres.   After adequate visualization, I excised the labrum, and then sequentially reamed.  I placed the trial acetabulum, which seated nicely, and then impacted the real cup into place.  Appropriate version and inclination was confirmed clinically matching their bony anatomy, and also with the use transverse acetabular ligament.  I placed a 20 mm screw in the posterior/superio  position with an excellent bite.    I then placed the polyethylene liner in place  I then abducted the leg and released the external rotators from the posterior femur allowing it to be easily delivered up lateral and anterior to the acetabulum for preparation of the femoral canal.    I then prepared the proximal femur using the cookie-cutter and then sequentially reamed and  broached.  A trial broach, neck, and head was utilized, and I reduced the hip and it was found to have excellent stability with functional range of motion..  I then impacted the real femoral prosthesis into place into the appropriate version, slightly anteverted to the normal anatomy, and I impacted the real head ball into place. The hip was then reduced and taken through functional range of motion and found to have excellent stability. Leg lengths were restored.  I then irrigated the hip copiously again with, and repaired the fascia with Vicryl, followed by monocryl for the subcutaneous tissue, Monocryl for the skin, Steri-Strips and sterile gauze. The wounds were injected. The patient was then awakened and returned to PACU in stable and satisfactory condition. There were no complications.  POST OPERATIVE PLAN: WBAT, DVT px: SCD's/TED and ASA 325 post op  Edmonia Lynch, MD Orthopedic Surgeon [phone]   This note was generated using a template and dragon dictation system. In light of that, I have reviewed the note and all aspects of it are applicable to this case. Any dictation errors are due to the computerized dictation system.

## 2014-10-12 ENCOUNTER — Encounter (HOSPITAL_COMMUNITY): Payer: Self-pay | Admitting: General Practice

## 2014-10-12 ENCOUNTER — Encounter: Payer: Self-pay | Admitting: Internal Medicine

## 2014-10-12 NOTE — Evaluation (Signed)
Occupational Therapy Evaluation Patient Details Name: MONI ROTHROCK MRN: [id number] DOB: 08-21-1923 Today's Date: 10/12/2014    History of Present Illness 81 y.o. female s/p direct anterior total hip arthroplasty.   Clinical Impression   Patient mod I PTA. Patient currently functioning at an overall min assist level. Patient will benefit from acute OT to increase overall independence in the areas of ADLs, functional mobility, and overall safety in order to safely discharge to venue listed below.     Follow Up Recommendations  SNF;Supervision/Assistance - 24 hour    Equipment Recommendations  None recommended by OT    Recommendations for Other Services  None at this time.      Precautions / Restrictions Precautions Precautions: Fall Restrictions Weight Bearing Restrictions: Yes LLE Weight Bearing: Weight bearing as tolerated Other Position/Activity Restrictions: Direct anterior - no hip precautions      Mobility Bed Mobility Overal bed mobility: Needs Assistance Bed Mobility: Supine to Sit     Supine to sit: Supervision;HOB elevated  Transfers Overall transfer level: Needs assistance Equipment used: Rolling walker (2 wheeled) Transfers: Sit to/from Stand Sit to Stand: Min guard General transfer comment: Cues required for safety and technique    Balance Overall balance assessment: Needs assistance Sitting-balance support: No upper extremity supported;Feet supported Sitting balance-Leahy Scale: Good     Standing balance support: Bilateral upper extremity supported;During functional activity Standing balance-Leahy Scale: Fair     ADL Overall ADL's : Needs assistance/impaired Eating/Feeding: Independent;Sitting   Grooming: Supervision/safety;Standing   Upper Body Bathing: Set up;Sitting   Lower Body Bathing: Minimal assistance;Sit to/from stand   Upper Body Dressing : Set up;Sitting   Lower Body Dressing: Minimal assistance;Sit to/from stand    Toilet Transfer: Minimal assistance;Comfort height toilet;RW;Grab bars   Toileting- Clothing Manipulation and Hygiene: Min guard;Sit to/from stand       Functional mobility during ADLs: Minimal assistance;Rolling walker General ADL Comments: Patient able to cross BLEs for LB ADLs. Patient requires min assist for functional ambulation/transfers at this time. Cues required for safety and technique during functional tasks/mobility    Pertinent Vitals/Pain Pain Assessment: No/denies pain ("I don't have any")     Hand Dominance Right   Extremity/Trunk Assessment Upper Extremity Assessment Upper Extremity Assessment: Overall WFL for tasks assessed   Lower Extremity Assessment Lower Extremity Assessment: Defer to PT evaluation   Cervical / Trunk Assessment Cervical / Trunk Assessment: Normal   Communication Communication Communication: No difficulties   Cognition Arousal/Alertness: Awake/alert Behavior During Therapy: WFL for tasks assessed/performed Overall Cognitive Status: Within Functional Limits for tasks assessed             Home Living Family/patient expects to be discharged to:: Skilled nursing facility Living Arrangements: Alone Available Help at Discharge: Family (son lives next door)  Home Equipment: Gilmer Mor - single point;Tub bench;Grab bars - toilet;Bedside commode (3-wheeled walker)          Prior Functioning/Environment Level of Independence: Independent with assistive device(s)    Comments: Used walker prior to surgery, occasionally used cane.    OT Diagnosis: Generalized weakness;Acute pain   OT Problem List: Decreased strength;Decreased range of motion;Decreased activity tolerance;Impaired balance (sitting and/or standing);Decreased coordination;Decreased safety awareness;Decreased knowledge of use of DME or AE;Decreased knowledge of precautions;Pain   OT Treatment/Interventions: Self-care/ADL training;Energy conservation;DME and/or AE  instruction;Therapeutic activities;Patient/family education;Balance training    OT Goals(Current goals can be found in the care plan section) Acute Rehab OT Goals Patient Stated Goal: Go to SNF OT Goal Formulation: With patient/family Time  For Goal Achievement: 10/19/14 Potential to Achieve Goals: Good ADL Goals Pt Will Perform Grooming: with modified independence;standing Pt Will Perform Lower Body Bathing: with supervision;sit to/from stand Pt Will Perform Lower Body Dressing: with supervision;sit to/from stand Pt Will Transfer to Toilet: with supervision;ambulating;bedside commode Pt Will Perform Toileting - Clothing Manipulation and hygiene: with supervision;sit to/from stand Pt Will Perform Tub/Shower Transfer: with supervision;tub bench;Tub transfer;rolling walker;ambulating  OT Frequency: Min 2X/week   Barriers to D/C: Decreased caregiver support          End of Session Equipment Utilized During Treatment: Rolling walker  Activity Tolerance: Patient tolerated treatment well Patient left: in chair;with call bell/phone within reach;with family/visitor present   Time: [id number] OT Time Calculation (min): 24 min Charges:  OT General Charges $OT Visit: 1 Procedure OT Evaluation $Initial OT Evaluation Tier I: 1 Procedure OT Treatments $Self Care/Home Management : 8-22 mins  Khamia Stambaugh , MS, OTR/L, CLT Pager: [phone]  10/12/2014, 10:26 AM

## 2014-10-12 NOTE — Discharge Planning (Signed)
Patient will discharge today per MD order. Patient will discharge to [facility] RN to call report prior to transportation to [phone] Transportation PTAR  CSW sent discharge summary to SNF for review.  Packet is complete.    Vickii PennaGina Vedanshi Massaro, LCSWA [phone]  Psychiatric & Orthopedics (5N 1-16) Clinical Social Worker

## 2014-10-12 NOTE — Progress Notes (Signed)
Physical Therapy Treatment Patient Details Name: [name] [name] MRN: [id number] DOB: 1923/04/20 Today's Date: 10/12/2014    History of Present Illness 81 y.o. female admitted to [facility] on 10/11/14 for elective left direct anterior THA.  Pt with significant PMHx of anxiety/depression, HTN, bradycardia, A-fib, anemia, SOB, cardiac pacemaker/defibrillator, and R ankle fx surgery (05/2002).    PT Comments    Pt is POD #1 and is progressing nicely with her mobility.  She was able to walk down the hallway and preform more of her left hip exercises. She continues to be unsteady on her feet even with the RW and would continue to benefit from SNF stay for rehab before returning home.  PT will follow acutely until d/c.       Follow Up Recommendations  SNF     Equipment Recommendations  Rolling walker with 5" wheels;3in1 (PT)    Recommendations for Other Services   NA     Precautions / Restrictions Precautions Precautions: Fall Precaution Comments: pt is unsteady on her feet Restrictions LLE Weight Bearing: Weight bearing as tolerated    Mobility           Transfers Overall transfer level: Needs assistance Equipment used: Rolling walker (2 wheeled) Transfers: Sit to/from Stand Sit to Stand: Min guard         General transfer comment: Min guard assist for safety and balance during transitions.  Verbal cues for safe hand placement during transitions  Ambulation/Gait Ambulation/Gait assistance: Min assist Ambulation Distance (Feet): 100 Feet Assistive device: Rolling walker (2 wheeled) Gait Pattern/deviations: Decreased step length - right;Antalgic;Staggering left;Staggering right Gait velocity: decreased Gait velocity interpretation: Below normal speed for age/gender General Gait Details: Pt with mildly antalgic gait pattern, improved with increased gait distance.  2-3 small staggers/LOB during gait even with RW requiring min assist to recover.           Balance  Overall balance assessment: Needs assistance Sitting-balance support: Feet supported;No upper extremity supported Sitting balance-Leahy Scale: Good     Standing balance support: Bilateral upper extremity supported Standing balance-Leahy Scale: Fair                      Cognition Arousal/Alertness: Awake/alert Behavior During Therapy: WFL for tasks assessed/performed Overall Cognitive Status: Within Functional Limits for tasks assessed (not specifically tested, does not remember getting up yester)                      Exercises Total Joint Exercises Ankle Circles/Pumps: AROM;Both;Supine;20 reps Quad Sets: AROM;Left;10 reps;Supine Heel Slides: AAROM;Left;10 reps;Supine Hip ABduction/ADduction: AAROM;Left;10 reps;Supine        Pertinent Vitals/Pain Pain Assessment: No/denies pain (at rest)    Home Living Family/patient expects to be discharged to:: Skilled nursing facility Living Arrangements: Alone                      PT Goals (current goals can now be found in the care plan section) Acute Rehab PT Goals Patient Stated Goal: go to [facility] in [place] for rehab.  Progress towards PT goals: Progressing toward goals    Frequency  7X/week    PT Plan Current plan remains appropriate       End of Session   Activity Tolerance: Patient limited by pain Patient left: in chair;with call bell/phone within reach;with family/visitor present     Time: 4098-1191 PT Time Calculation (min) (ACUTE ONLY): 14 min  Charges:  $Gait Training: 8-22 mins  Rollene Rotundaebecca B. Verley Pariseau, PT, DPT [phone]   10/12/2014, 2:45 PM

## 2014-10-12 NOTE — Progress Notes (Addendum)
Report was called to [facility]. IV removed, given vicodin and zofran before transport. [facility] at [facility] and [facility] was made aware of pts BP.   Lowella DellHudson, Sagan Maselli G 10/12/2014

## 2014-10-12 NOTE — Discharge Summary (Signed)
Physician Discharge Summary  Patient ID: [name] [name] MRN: [id number] DOB/AGE: [birth year] 81 y.o.  Admit date: 10/11/2014 Discharge date: 10/12/2014  Admission Diagnoses:  <principal problem not specified>  Discharge Diagnoses:  Active Problems:   DJD (degenerative joint disease)   Past Medical History  Diagnosis Date  . Allergic rhinitis   . Anxiety   . Depression   . GERD (gastroesophageal reflux disease)   . Hypertension   . Osteopenia   . Bradycardia   . Hyperlipidemia   . Duodenal ulcer   . Other specified cardiac dysrhythmias(427.89)   . Diverticulosis   . GI bleed   . Atrial fibrillation   . Anemia associated with acute blood loss   . Diverticulosis   . Shortness of breath dyspnea     with exertion    Surgeries: Procedure(s): TOTAL HIP ARTHROPLASTY ANTERIOR APPROACH on 10/11/2014   Consultants (if any):    Discharged Condition: Improved  Hospital Course: [name] [name] is an 81 y.o. female who was admitted 10/11/2014 with a diagnosis of <principal problem not specified> and went to the operating room on 10/11/2014 and underwent the above named procedures.    She was given perioperative antibiotics:  Anti-infectives    Start     Dose/Rate Route Frequency Ordered Stop   10/11/14 1400  ceFAZolin (ANCEF) IVPB 2 g/50 mL premix     2 g 100 mL/hr over 30 Minutes Intravenous Every 6 hours 10/11/14 1311 10/11/14 2058   10/11/14 0600  ceFAZolin (ANCEF) IVPB 2 g/50 mL premix     2 g 100 mL/hr over 30 Minutes Intravenous On call to O.R. 10/10/14 1358 10/11/14 0744    .  She was given sequential compression devices, early ambulation, and ASA  for DVT prophylaxis.  She benefited maximally from the hospital stay and there were no complications.    Recent vital signs:  Filed Vitals:   10/12/14 0554  BP: 90/42  Pulse: 63  Temp: 98 F (36.7 C)  Resp: 16    Recent laboratory studies:  Lab Results  Component Value Date   HGB 12.6 09/27/2014    HGB 11.7* 04/06/2014   HGB 12.3 03/16/2014   Lab Results  Component Value Date   WBC 6.8 09/27/2014   PLT 290 09/27/2014   Lab Results  Component Value Date   INR 1.06 09/27/2014   Lab Results  Component Value Date   NA 136 09/27/2014   K 3.7 09/27/2014   CL 98 09/27/2014   CO2 33* 09/27/2014   BUN 22 09/27/2014   CREATININE 0.95 09/27/2014   GLUCOSE 101* 09/27/2014    Discharge Medications:     Medication List    STOP taking these medications        naproxen sodium 220 MG tablet  Commonly known as:  ANAPROX      TAKE these medications        aspirin EC 325 MG tablet  Take 1 tablet (325 mg total) by mouth daily.     bisacodyl 5 MG EC tablet  Commonly known as:  DULCOLAX  Take 5 mg by mouth daily as needed for moderate constipation.     calcium elemental as carbonate 400 MG tablet  Commonly known as:  BARIATRIC TUMS ULTRA  Chew 1,000 mg by mouth daily as needed for heartburn.     cholecalciferol 1000 UNITS tablet  Commonly known as:  VITAMIN D  Take 2,000 Units by mouth daily.     docusate sodium 100 MG capsule  Commonly known as:  COLACE  Take 100 mg by mouth daily as needed for mild constipation.     docusate sodium 100 MG capsule  Commonly known as:  COLACE  Take 1 capsule (100 mg total) by mouth 2 (two) times daily. Continue this while taking narcotics to help with bowel movements     hydrochlorothiazide 25 MG tablet  Commonly known as:  HYDRODIURIL  Take 1 tablet (25 mg total) by mouth daily.     HYDROcodone-acetaminophen 5-325 MG per tablet  Commonly known as:  NORCO  Take 1-2 tablets by mouth every 4 (four) hours as needed for moderate pain.     metoprolol tartrate 25 MG tablet  Commonly known as:  LOPRESSOR  Take 1 tablet (25 mg total) by mouth 2 (two) times daily.     multivitamin tablet  Take 1 tablet by mouth daily.     omeprazole 20 MG capsule  Commonly known as:  PRILOSEC  Take 1 capsule (20 mg total) by mouth daily.      ondansetron 4 MG tablet  Commonly known as:  ZOFRAN  Take 1 tablet (4 mg total) by mouth every 8 (eight) hours as needed for nausea.     sertraline 100 MG tablet  Commonly known as:  ZOLOFT  Take 1 tablet (100 mg total) by mouth daily.        Diagnostic Studies: Dg Pelvis Portable  10/11/2014   CLINICAL DATA:  Postoperative exam after left total hip arthroplasty  EXAM: PORTABLE PELVIS 1-2 VIEWS  COMPARISON:  01/10/2014  FINDINGS: Left total hip arthroplasty noted. No evidence for hardware failure. No fracture line identified. Mild right hip degenerative change noted. Pelvic phleboliths are noted.  IMPRESSION: Interval placement of left total hip arthroplasty.   Electronically Signed   By: Christiana PellantGretchen  Green M.D.   On: 10/11/2014 12:28    Disposition: 01-Home or Self Care      Discharge Instructions    Weight bearing as tolerated    Complete by:  As directed            Follow-up Information    Follow up with Sheral ApleyMURPHY, TIMOTHY, D, MD.   Specialty:  Orthopedic Surgery   Contact information:   [address] [place] [id number] [phone]        Signed: Lynann BolognaKelly,Dean Goldner Marie 10/12/2014, 10:37 AM

## 2014-10-12 NOTE — Progress Notes (Signed)
10/12/14 PT recommended SNF. Referral made to CSW. [name]. Will continue to follow until discharged.

## 2014-10-12 NOTE — Clinical Social Work Psychosocial (Signed)
Clinical Social Work Department BRIEF PSYCHOSOCIAL ASSESSMENT 10/12/2014  Patient:  [name] [name],[name] [name]     Account Number:  [id number]     Admit date:  10/11/2014  Clinical Social Worker:  Read DriversINGLE,[name] [name], LCSWA  Date/Time:  10/12/2014 03:25 PM  Referred by:  Physician  Date Referred:  10/12/2014 Referred for  SNF Placement  Psychosocial assessment   Other Referral:   none   Interview type:  Patient Other interview type:   none    PSYCHOSOCIAL DATA Living Status:  ALONE Admitted from facility:   Level of care:   Primary support name:  [name] [name] Primary support relationship to patient:  CHILD, ADULT Degree of support available:   strong    CURRENT CONCERNS Current Concerns  Post-Acute Placement   Other Concerns:   none    SOCIAL WORK ASSESSMENT / PLAN CSW assessed patient at bedside.  Patient is alert and oriented x4.  Patient had daughter, [name] [name] at bedside who was participating in assessment as well, per patient's request.  Patient reports being from home alone prior to this hospital admission.  Patient reports being a widow and well taken care of by her daughter.  [facility] will [facility] at time of dc and will need hospital transportation.   Assessment/plan status:  Psychosocial Support/Ongoing Assessment of Needs Other assessment/ plan:   FL2  PASARR   Information/referral to community resources:   SNF/STR    PATIENT'S/FAMILY'S RESPONSE TO PLAN OF CARE: Patient is agreeable to a SN[place] of [place]. First choice [facility]       [name] [name], [place] [phone]  Psychiatric & Orthopedics (5N 1-16) Clinical Social Worker

## 2014-10-13 ENCOUNTER — Encounter (HOSPITAL_COMMUNITY): Payer: Self-pay | Admitting: Orthopedic Surgery

## 2014-10-20 ENCOUNTER — Encounter: Payer: Commercial Managed Care - HMO | Admitting: *Deleted

## 2014-10-20 ENCOUNTER — Telehealth: Payer: Self-pay | Admitting: Cardiology

## 2014-10-20 NOTE — Telephone Encounter (Signed)
LMOVM reminding pt to send remote transmission.   

## 2014-10-21 ENCOUNTER — Encounter: Payer: Self-pay | Admitting: Cardiology

## 2014-10-25 ENCOUNTER — Encounter
Admit: 2014-10-25 | Disposition: A | Discharge status: Home / Self Care | Payer: Self-pay | Attending: Internal Medicine | Admitting: Internal Medicine

## 2014-10-31 ENCOUNTER — Telehealth: Payer: Self-pay

## 2014-10-31 NOTE — Telephone Encounter (Signed)
Sorry-this confused me Printed and in IN box -see if this letter is ok .  Thanks

## 2014-10-31 NOTE — Telephone Encounter (Signed)
Jury letter faxed to patients daughter [name].  She is aware.

## 2014-10-31 NOTE — Telephone Encounter (Signed)
[name] PerchesKathy [name] pts daughter left v/m; pt has appts on 11/23/14 with Dr Eulah PontMurphy for 4 week f/u post hip surgery and pt has CPX appt on 11/30/14 with Dr Milinda Antisower. [name] MessierKathy takes her mother to all appts. [name] MessierKathy has received jury summons for 2 week period from 11/21/14 - 12/02/14 and [name] MessierKathy request letter to postpone her serving on jury duty due to these 2 appts and pt needs to keep them both. [name] MessierKathy request cb.

## 2014-10-31 NOTE — Telephone Encounter (Signed)
Patient's daughter called and said the letter for jury duty was made for patient and it should have been made out for her daughter, [name] [name], to postpone her jury duty, so she can take patient to her appointments.

## 2014-10-31 NOTE — Telephone Encounter (Signed)
Letter printed and in IN box 

## 2014-11-01 ENCOUNTER — Telehealth: Payer: Self-pay | Admitting: Family Medicine

## 2014-11-01 ENCOUNTER — Encounter: Payer: Commercial Managed Care - HMO | Admitting: Internal Medicine

## 2014-11-01 NOTE — Telephone Encounter (Signed)
Jury duty letter faxed to [phone].

## 2014-11-01 NOTE — Telephone Encounter (Signed)
Please fax letter for jury duty to [name] [name].  Fax #[phone].  If you can't fax it to her today, please mail it to her.

## 2014-11-01 NOTE — Telephone Encounter (Signed)
Left message informing [name] MessierKathy letter will be mailed.

## 2014-11-04 ENCOUNTER — Telehealth: Payer: Self-pay

## 2014-11-04 ENCOUNTER — Other Ambulatory Visit: Payer: Commercial Managed Care - HMO

## 2014-11-04 NOTE — Telephone Encounter (Signed)
[name] pts daughter left v/m; pt returned home today after rehab and pt was given list of meds and prescriptions; [name] did not have xarelto rx filled because pt had never taken before and pt was not aware she was taking a blood thinner.[name] request cb.

## 2014-11-04 NOTE — Telephone Encounter (Signed)
Spoke with [name] regarding patient xarelto.  Advised her the medication is used for DVT.  [name] already called Dr Eulah PontMurphy to see why patient should take medication.  She is waiting call back.

## 2014-11-04 NOTE — Telephone Encounter (Signed)
The only thought I have is ? If orthopedics wanted her on it for DVT prophylaxis -but I do not see it in the discharge summary Please have them call their ortho to clarify

## 2014-11-09 ENCOUNTER — Telehealth: Payer: Self-pay

## 2014-11-09 NOTE — Telephone Encounter (Signed)
Spoke with [name] patients daughter.  Patient is not taking tramadol, nor hydrocodone.  Patient does not have any post op pain.  Patient needs something for arthritis pain.  Patient has not tried any otc arthritis medication.  Patients daughter is going to have her try otc meds and will call back if they are ineffective.

## 2014-11-09 NOTE — Telephone Encounter (Signed)
[name] pts daughter left /vm; pts caregiver asked [name] if it is OK for pt to take tramadol for arthritis pain. [name] wants to know if Dr Milinda Antisower thinks OK for pt to take tramadol for arthritic pain and if it is not ok for pt to take what is Dr Milinda Antisower advise for pt to take for arthritis. [name] request cb at [phone] and can leave detailed v/m if no answer; pt is still having some memory problem and is scheduled for CPX on 11/30/14. [facility].

## 2014-11-09 NOTE — Telephone Encounter (Signed)
She has norco on her med list -is she currently taking that?  How is her post operative pain?  Has she tried tramadol in the past? If so -does she do well with it? thanks

## 2014-11-11 ENCOUNTER — Encounter: Payer: Commercial Managed Care - HMO | Admitting: Family Medicine

## 2014-11-17 ENCOUNTER — Telehealth: Payer: Self-pay

## 2014-11-17 DIAGNOSIS — R413 Other amnesia: Secondary | ICD-10-CM

## 2014-11-17 NOTE — Telephone Encounter (Signed)
[name] notified, she feels her mom can wait to see Dr. Milinda Antisower, I advised her to have someone with her at her home right now until she gets seen. Daughter is concerned about her forgetting everything, worried she may not be eating even though she states she has eaten. [name] wants a memory test done.

## 2014-11-17 NOTE — Telephone Encounter (Signed)
[name] pts daughter left v/m; pt having more issues with memory;pts memory is worse since seen 10/05/14; pt cannot remember what she ate for lunch 5 mins after eating. [name] wants to know if could get referral for neurologist for testing. Pt has appt 11/30/14 for medicare wellness.  [name] also wants to know name of med pt is taking for sleep? [name] request cb. Dr Milinda Antisower out of office the rest of this week and next week.Please advise.

## 2014-11-17 NOTE — Telephone Encounter (Signed)
Zoloft was listed for sleep per Dr. Milinda Antisower.  I would keep the OV with Dr. Milinda Antisower.  I am not against getting her in with neuro, but be may not be able to get her in with neuro before the OV with [facility] anyway.   If she is unsafe or decompensating from her memory loss, then we need to know about that and get her in sooner next week with any available MD here.  Thanks.

## 2014-11-17 NOTE — Telephone Encounter (Signed)
Noted, will route to PCP as FYI.

## 2014-11-18 ENCOUNTER — Encounter: Payer: Commercial Managed Care - HMO | Admitting: Family Medicine

## 2014-11-20 NOTE — Telephone Encounter (Signed)
I'm going ahead with the neuro referral- to get that started

## 2014-11-22 ENCOUNTER — Encounter: Payer: Commercial Managed Care - HMO | Admitting: Family Medicine

## 2014-11-22 ENCOUNTER — Telehealth: Payer: Self-pay

## 2014-11-22 NOTE — Telephone Encounter (Signed)
Appt made with Dr Malvin JohnsPotter and patients daughter notified.

## 2014-11-22 NOTE — Telephone Encounter (Signed)
I have never seen pt but I have reviewed her last few office notes.  I would suggest that she does hold the Senna for now.  Ok to restart once diarrhea resolves if she has a history of constipation.  Is she currently taking narcotics which can cause constipation?

## 2014-11-22 NOTE — Telephone Encounter (Signed)
Spoke to pt and advised; verbally expressed understanding. Pt is not currently taking any narcotics

## 2014-11-22 NOTE — Telephone Encounter (Signed)
[name] [name] pts daughter left v/m; while pt was at rehab pt was given senna 2 tabs bid; pt continued same regimen when pt got home; pt has had diarrhea and senna was decreased 2 tabs once daily. [name] [name] wants to know if pt should continue Senna since pt having diarrhea. [name] [name] request not to call pt; pt will not remember conversation.Please advise.

## 2014-11-24 NOTE — Telephone Encounter (Signed)
[name] pts daughter left v/m requesting cb at [phone] about 11/22/14 call. Pt has difficulty with memory and [name] is on DPR to talk with.

## 2014-11-24 NOTE — Telephone Encounter (Signed)
L/m requesting a call back.

## 2014-11-25 ENCOUNTER — Encounter
Admit: 2014-11-25 | Disposition: A | Discharge status: Home / Self Care | Payer: Self-pay | Attending: Internal Medicine | Admitting: Internal Medicine

## 2014-11-30 ENCOUNTER — Encounter (INDEPENDENT_AMBULATORY_CARE_PROVIDER_SITE_OTHER): Payer: Self-pay

## 2014-11-30 ENCOUNTER — Encounter: Payer: Self-pay | Admitting: Family Medicine

## 2014-11-30 ENCOUNTER — Ambulatory Visit (INDEPENDENT_AMBULATORY_CARE_PROVIDER_SITE_OTHER): Payer: Commercial Managed Care - HMO | Admitting: Family Medicine

## 2014-11-30 ENCOUNTER — Other Ambulatory Visit: Payer: Self-pay | Admitting: Family Medicine

## 2014-11-30 VITALS — BP 102/58 | HR 98 | Temp 98.4°F | Resp 18 | Ht 62.0 in | Wt 131.1 lb

## 2014-11-30 DIAGNOSIS — R413 Other amnesia: Secondary | ICD-10-CM | POA: Diagnosis not present

## 2014-11-30 DIAGNOSIS — Z0000 Encounter for general adult medical examination without abnormal findings: Secondary | ICD-10-CM

## 2014-11-30 DIAGNOSIS — Z23 Encounter for immunization: Secondary | ICD-10-CM | POA: Diagnosis not present

## 2014-11-30 DIAGNOSIS — M8580 Other specified disorders of bone density and structure, unspecified site: Secondary | ICD-10-CM

## 2014-11-30 DIAGNOSIS — I10 Essential (primary) hypertension: Secondary | ICD-10-CM

## 2014-11-30 DIAGNOSIS — E538 Deficiency of other specified B group vitamins: Secondary | ICD-10-CM | POA: Diagnosis not present

## 2014-11-30 LAB — LIPID PANEL
Cholesterol: 237 mg/dL — ABNORMAL HIGH (ref 0–200)
HDL: 54.6 mg/dL (ref >39.00)
LDL CALC: 160 mg/dL — AB (ref 0–99)
NonHDL: 182.4
Total CHOL/HDL Ratio: 4
Triglycerides: 113 mg/dL (ref 0.0–149.0)
VLDL: 22.6 mg/dL (ref 0.0–40.0)

## 2014-11-30 LAB — CBC WITH DIFFERENTIAL/PLATELET
Basophils Absolute: 0 10*3/uL (ref 0.0–0.1)
Basophils Relative: 0.5 % (ref 0.0–3.0)
EOS ABS: 0.1 10*3/uL (ref 0.0–0.7)
EOS PCT: 1.6 % (ref 0.0–5.0)
HEMATOCRIT: 34.9 % — AB (ref 36.0–46.0)
HEMOGLOBIN: 11.5 g/dL — AB (ref 12.0–15.0)
LYMPHS ABS: 2.1 10*3/uL (ref 0.7–4.0)
Lymphocytes Relative: 25.7 % (ref 12.0–46.0)
MCHC: 33 g/dL (ref 30.0–36.0)
MCV: 90.9 fl (ref 78.0–100.0)
Monocytes Absolute: 0.7 10*3/uL (ref 0.1–1.0)
Monocytes Relative: 8.2 % (ref 3.0–12.0)
NEUTROS ABS: 5.3 10*3/uL (ref 1.4–7.7)
Neutrophils Relative %: 64 % (ref 43.0–77.0)
Platelets: 350 10*3/uL (ref 150.0–400.0)
RBC: 3.84 Mil/uL — ABNORMAL LOW (ref 3.87–5.11)
RDW: 15.6 % — ABNORMAL HIGH (ref 11.5–15.5)
WBC: 8.3 10*3/uL (ref 4.0–10.5)

## 2014-11-30 MED ORDER — CYANOCOBALAMIN 1000 MCG/ML IJ SOLN
1000.0000 ug | Freq: Once | INTRAMUSCULAR | Status: AC
  Administered 2014-11-30: 1000 ug via INTRAMUSCULAR

## 2014-11-30 MED ORDER — METOPROLOL TARTRATE 25 MG PO TABS: 25.0000 mg | ORAL_TABLET | Freq: Two times a day (BID) | ORAL | Status: DC

## 2014-11-30 MED ORDER — TRAZODONE HCL 50 MG PO TABS: 50.0000 mg | ORAL_TABLET | Freq: Every day | ORAL | Status: DC

## 2014-11-30 NOTE — Progress Notes (Signed)
Subjective:    Patient ID: [name] [name], female    DOB: 06-07-1923, 81 y.o.   MRN: [id number]  HPI Here for annual medicare wellness visit as well as chronic/acute medical problems   I have personally reviewed the Medicare Annual Wellness questionnaire and have noted 1. The patient's medical and social history 2. Their use of alcohol, tobacco or illicit drugs 3. Their current medications and supplements 4. The patient's functional ability including ADL's, fall risks, home safety risks and hearing or visual             impairment. 5. Diet and physical activities 6. Evidence for depression or mood disorders  The patients weight, height, BMI have been recorded in the chart and visual acuity is per eye clinic.  I have made referrals, counseling and provided education to the patient based review of the above and I have provided the pt with a written personalized care plan for preventive services.  Had her hip replacement in feb- did great!  Had short PT and OT - got to stop early  No pain No walker or cane   Having memory problems - short term   Wt is up 11 lb - appetite is better - happy about that  bmi 23   See scanned forms.  Routine anticipatory guidance given to patient.  See health maintenance. Colon cancer screening 10/14  Breast cancer screening - declines further mammograms  Self breast exam -no lumps or changes  Flu vaccine 9/15 Tetanus vaccine 3/15  Pneumovax 9/13 , will get prevnar today  Zoster vaccine 3/14  dexa 8/07 osteopenia , no new fractures , no recent falls, does take her ca and D  Advance directive - has that done and written up  Cognitive function addressed- see scanned forms- and if abnormal then additional documentation follows. - short term memory is much worse - has appt with neuro (DR Malvin Johns) - later this mo Issues with forgetting meds / no safety worries    PMH and SH reviewed  Meds, vitals, and allergies reviewed.   ROS: See HPI.   Otherwise negative.     Vit B12 - low normal range 289  Will get a shot today   Lab Results  Component Value Date   WBC 6.8 09/27/2014   HGB 12.6 09/27/2014   HCT 38.1 09/27/2014   MCV 93.6 09/27/2014   PLT 290 09/27/2014   given iron after her surgery -not taking     Chemistry      Component Value Date/Time   NA 136 09/27/2014 1023   K 3.7 09/27/2014 1023   CL 98 09/27/2014 1023   CO2 33* 09/27/2014 1023   BUN 22 09/27/2014 1023   CREATININE 0.95 09/27/2014 1023      Component Value Date/Time   CALCIUM 9.8 09/27/2014 1023   ALKPHOS 81 04/06/2014 1318   AST 28 04/06/2014 1318   ALT 17 04/06/2014 1318   BILITOT 0.6 04/06/2014 1318      Lab Results  Component Value Date   TSH 2.65 10/05/2014    Lab Results  Component Value Date   CHOL 191 11/01/2013   HDL 52.20 11/01/2013   LDLCALC 114* 11/01/2013   LDLDIRECT 138.1 11/02/2012   TRIG 125.0 11/01/2013   CHOLHDL 4 11/01/2013  eats fairly -is eating more lately - some cheese  Was taking ensure  occ cookies /some frozen food  No red meat   bp is stable today  No cp or palpitations or headaches  or edema  No side effects to medicines  BP Readings from Last 3 Encounters:  11/30/14 102/58  10/12/14 83/45  10/05/14 148/82    Well controlled   Has medical alert necklace  Some care at home but not full time  Has help with bathing 3 times per week  Family fills up her pill box-days of the week   Does not sleep well  Gets up to urinate 2 times  Does not sleep -is "nervous" get things on her mind -worries about family and their health  Does not sleep well since before surgery  Currently taking trazadone - ? If works     Patient Active Problem List   Diagnosis Date Noted  . DJD (degenerative joint disease) 10/11/2014  . Memory loss 10/05/2014  . Fatigue 10/05/2014  . B12 deficiency 10/05/2014  . Preoperative examination 09/13/2014  . Insomnia 08/02/2014  . Hot flashes 08/02/2014  . Other malaise and  fatigue 04/06/2014  . Rectal bleeding 03/24/2014  . Personal history of arteriovenous malformation (AVM) 03/24/2014  . Lesion of throat 01/18/2014  . Left hip pain 01/10/2014  . Hip osteoarthritis 01/10/2014  . Osteopenia 11/11/2013  . Encounter for Medicare annual wellness exam 11/10/2013  . Hand pain, left 11/10/2013  . Fall due to stumbling 11/01/2013  . Contusion 11/01/2013  . Pacemaker -Medtronic 07/20/2013  . Angiodysplasia of colon with hemorrhage 06/25/2013  . Atrial fibrillation 06/06/2011  . Anxiety state 06/16/2007  . DEPRESSION 06/16/2007  . Essential hypertension 06/16/2007  . GERD 06/16/2007   Past Medical History  Diagnosis Date  . Allergic rhinitis   . Anxiety   . Depression   . GERD (gastroesophageal reflux disease)   . Hypertension   . Osteopenia   . Bradycardia   . Hyperlipidemia   . Duodenal ulcer   . Other specified cardiac dysrhythmias(427.89)   . Diverticulosis   . GI bleed   . Atrial fibrillation   . Anemia associated with acute blood loss   . Diverticulosis   . Shortness of breath dyspnea     with exertion  . Presence of permanent cardiac pacemaker    Past Surgical History  Procedure Laterality Date  . Appendectomy    . Total abdominal hysterectomy    . Tonsillectomy    . Ankle fracture surgery Right 05/2002  . Insert / replace / remove pacemaker    . Cardiac defibrillator placement    . Esophagogastroduodenoscopy N/A 06/24/2013    Procedure: ESOPHAGOGASTRODUODENOSCOPY (EGD);  Surgeon: Hilarie Fredrickson, MD;  Location: [facility];  Service: Endoscopy;  Laterality: N/A;  . Colonoscopy N/A 06/25/2013    Procedure: COLONOSCOPY;  Surgeon: Hilarie Fredrickson, MD;  Location: [facility];  Service: Endoscopy;  Laterality: N/A;  . Total hip arthroplasty Left 10/11/2014    DR MURPHY  . Total hip arthroplasty Left 10/11/2014    Procedure: TOTAL HIP ARTHROPLASTY ANTERIOR APPROACH;  Surgeon: Sheral Apley, MD;  Location: [facility] OR;  Service: Orthopedics;   Laterality: Left;   History  Substance Use Topics  . Smoking status: Never Smoker   . Smokeless tobacco: Never Used  . Alcohol Use: No     Comment: infrequently-rare   Family History  Problem Relation Age of Onset  . Heart attack Father   . Depression Mother   . Alzheimer's disease Mother   . Pneumonia Mother   . Heart disease Brother     hx of CABG   Allergies  Allergen Reactions  . Ace Inhibitors  REACTION: cough  . Alendronate Sodium     REACTION: aches  . Avapro [Irbesartan]     hypotension  . Codeine     Dizzy and nauseated   . Dabigatran Etexilate Mesylate Diarrhea    indigestion  . Nitrofurantoin     REACTION: rash  . Penicillins     REACTION: rash  . Prednisone Other (See Comments)    Extreme weakness   Current Outpatient Prescriptions on File Prior to Visit  Medication Sig Dispense Refill  . aspirin EC 325 MG tablet Take 1 tablet (325 mg total) by mouth daily. 30 tablet 0  . hydrochlorothiazide (HYDRODIURIL) 25 MG tablet Take 1 tablet (25 mg total) by mouth daily. 90 tablet 3  . Multiple Vitamin (MULTIVITAMIN) tablet Take 1 tablet by mouth daily.      . sertraline (ZOLOFT) 100 MG tablet Take 1 tablet (100 mg total) by mouth daily. 90 tablet 3  . docusate sodium (COLACE) 100 MG capsule Take 1 capsule (100 mg total) by mouth 2 (two) times daily. Continue this while taking narcotics to help with bowel movements (Patient not taking: Reported on 11/30/2014) 30 capsule 1  . ondansetron (ZOFRAN) 4 MG tablet Take 1 tablet (4 mg total) by mouth every 8 (eight) hours as needed for nausea. (Patient not taking: Reported on 11/30/2014) 60 tablet 0   No current facility-administered medications on file prior to visit.    Review of Systems Review of Systems  Constitutional: Negative for fever, appetite change, fatigue and unexpected weight change.  Eyes: Negative for pain and visual disturbance.  Respiratory: Negative for cough and shortness of breath.   Cardiovascular:  Negative for cp or palpitations    Gastrointestinal: Negative for nausea, diarrhea and constipation.  Genitourinary: Negative for urgency and frequency.  Skin: Negative for pallor or rash   Neurological: Negative for weakness, light-headedness, numbness and headaches.  Hematological: Negative for adenopathy. Does not bruise/bleed easily.  Psychiatric/Behavioral: Negative for dysphoric mood. The patient is sometimes anxious, pos for sleep difficulty  pos for short term memory problems        Objective:   Physical Exam  Constitutional: She appears well-developed and well-nourished. No distress.  HENT:  Head: Normocephalic and atraumatic.  Right Ear: External ear normal.  Left Ear: External ear normal.  Nose: Nose normal.  Mouth/Throat: Oropharynx is clear and moist.  Eyes: Conjunctivae and EOM are normal. Pupils are equal, round, and reactive to light. Right eye exhibits no discharge. Left eye exhibits no discharge. No scleral icterus.  Neck: Normal range of motion. Neck supple. No JVD present. Carotid bruit is not present. No thyromegaly present.  Cardiovascular: Normal rate, regular rhythm, normal heart sounds and intact distal pulses.  Exam reveals no gallop.   Pulmonary/Chest: Effort normal and breath sounds normal. No respiratory distress. She has no wheezes. She has no rales.  Abdominal: Soft. Bowel sounds are normal. She exhibits no distension and no mass. There is no tenderness.  Genitourinary:  Declines breast exam  Musculoskeletal: She exhibits no edema or tenderness.  Gait is improved-not using cane or walker  Lymphadenopathy:    She has no cervical adenopathy.  Neurological: She is alert. She has normal reflexes. No cranial nerve deficit. She exhibits normal muscle tone. Coordination normal.  Skin: Skin is warm and dry. No rash noted. No erythema. No pallor.  Psychiatric: She has a normal mood and affect. Cognition and memory are impaired. She exhibits abnormal recent  memory.  Assessment & Plan:   Problem List Items Addressed This Visit      Cardiovascular and Mediastinum   Essential hypertension - Primary    bp in fair control at this time  BP Readings from Last 1 Encounters:  11/30/14 102/58   No changes needed Disc lifstyle change with low sodium diet and exercise  Labs reviewed        Relevant Medications   metoprolol tartrate (LOPRESSOR) tablet   Other Relevant Orders   CBC with Differential/Platelet (Completed)   Lipid panel (Completed)     Digestive   B12 deficiency    Not helping memory problems  B12 shot today Then otc supplementation      Relevant Medications   cyanocobalamin ((VITAMIN B-12)) injection 1,000 mcg (Completed)     Musculoskeletal and Integument   Osteopenia    Disc need for calcium/ vitamin D/ wt bearing exercise and bone density test every 2 y to monitor Disc safety/ fracture risk in detail  Pt declines dexa currently No fx Will inc her D intake         Other   Encounter for Medicare annual wellness exam    Reviewed health habits including diet and exercise and skin cancer prevention Reviewed appropriate screening tests for age  Also reviewed health mt list, fam hx and immunization status , as well as social and family history   See HPI Rev last lab Lab today B12 shot today  Get a B complex vitamin over the counter and take it daily  prevnar vaccine today  Follow up with Dr Malvin Johns about memory and sleep -for now continue trazadone  Get vitamin D3 over the counter and take 2000 iu daily        Memory loss    Long disc re: short term memory loss / age and other factors  B12 shot today For f/u with neurology as planned for further eval  States mood is stable  Disc imp of socialization and other "brain exercise" Disc safety - asked family to help with medications        Other Visit Diagnoses    Need for vaccination with 13-polyvalent pneumococcal conjugate vaccine         Relevant Orders    Pneumococcal conjugate vaccine 13-valent IM (Completed)

## 2014-11-30 NOTE — Patient Instructions (Signed)
B12 shot today  Get a B complex vitamin over the counter and take it daily  prevnar vaccine today  Follow up with Dr Malvin JohnsPotter about memory and sleep -for now continue trazadone  Get vitamin D3 over the counter and take 2000 iu daily Some labs today

## 2014-11-30 NOTE — Telephone Encounter (Signed)
[name] wanted to ck on status of omeprazole refill; advised [facility]. [name] voiced understanding.

## 2014-12-01 ENCOUNTER — Telehealth: Payer: Self-pay | Admitting: Family Medicine

## 2014-12-01 NOTE — Assessment & Plan Note (Signed)
bp in fair control at this time  BP Readings from Last 1 Encounters:  11/30/14 102/58   No changes needed Disc lifstyle change with low sodium diet and exercise  Labs reviewed

## 2014-12-01 NOTE — Assessment & Plan Note (Signed)
Long disc re: short term memory loss / age and other factors  B12 shot today For f/u with neurology as planned for further eval  States mood is stable  Disc imp of socialization and other "brain exercise" Disc safety - asked family to help with medications

## 2014-12-01 NOTE — Assessment & Plan Note (Signed)
Not helping memory problems  B12 shot today Then otc supplementation

## 2014-12-01 NOTE — Assessment & Plan Note (Signed)
Disc need for calcium/ vitamin D/ wt bearing exercise and bone density test every 2 y to monitor Disc safety/ fracture risk in detail  Pt declines dexa currently No fx Will inc her D intake

## 2014-12-01 NOTE — Assessment & Plan Note (Signed)
Reviewed health habits including diet and exercise and skin cancer prevention Reviewed appropriate screening tests for age  Also reviewed health mt list, fam hx and immunization status , as well as social and family history   See HPI Rev last lab Lab today B12 shot today  Get a B complex vitamin over the counter and take it daily  prevnar vaccine today  Follow up with Dr Malvin JohnsPotter about memory and sleep -for now continue trazadone  Get vitamin D3 over the counter and take 2000 iu daily

## 2014-12-12 NOTE — Telephone Encounter (Signed)
Opened in error

## 2014-12-17 NOTE — Op Note (Signed)
PATIENT NAME:  [name], [name] [name] MR#:  [id number] DATE OF BIRTH:  1923/07/25  DATE OF PROCEDURE:  02/08/2014  PREOPERATIVE DIAGNOSIS:  Senile cataract left eye.  POSTOPERATIVE DIAGNOSIS:  Senile cataract left eye.  PROCEDURE:  Phacoemulsification with posterior chamber intraocular lens implantation of the left eye.  LENS:  ZCB00, 21.0-diopter posterior chamber intraocular lens.  ULTRASOUND TIME:  14% of  1 minute 16 seconds.  CDE 12.0.  SURGEON:  Italyhad Brasington, MD  ANESTHESIA:  Topical with tetracaine drops and 2% Xylocaine jelly.  COMPLICATIONS:  None.  DESCRIPTION OF PROCEDURE:  The patient was identified in the holding room and transported to the operating room and placed in the supine position under the operating microscope.  The left eye was identified as the operative eye and it was prepped and draped in the usual sterile ophthalmic fashion.  A 1 millimeter clear-corneal paracentesis was made at the 1:30 position.  The anterior chamber was filled with Viscoat viscoelastic.  A 2.4 millimeter keratome was used to make a near-clear corneal incision at the  10:30 position.  A curvilinear capsulorrhexis was made with a cystotome and capsulorrhexis forceps.  Balanced salt solution was used to hydrodissect and hydrodelineate the nucleus.  Phacoemulsification was then used in stop and chop fashion to remove the lens nucleus and epinucleus.  The remaining cortex was then removed using the irrigation and aspiration handpiece. Provisc was then placed into the capsular bag to distend it for lens placement.  A ZCB00, 21.0-diopter lens was then injected into the capsular bag.  The remaining viscoelastic was aspirated.  Wounds were hydrated with balanced salt solution.  The anterior chamber was inflated to a physiologic pressure with balanced salt solution.  0.1 mL of cefuroxime 10 mg/mL were injected into the anterior chamber for a dose of 1 mg of intracameral antibiotic at the completion of  the case. Miostat was placed into the anterior chamber to constrict the pupil.  No wound leaks were noted.  Topical Vigamox drops and Maxitrol ointment were applied to the eye.    The patient was taken to the recovery room in stable condition without complications of anesthesia or surgery.     ____________________________ Deirdre Evenerhadwick R. Brasington, MD crb:dmm D: 02/08/2014 12:07:01 ET T: 02/08/2014 12:19:18 ET JOB#: [id number]  cc: Deirdre Evenerhadwick R. Brasington, MD, <Dictator> Lockie MolaHADWICK BRASINGTON MD ELECTRONICALLY SIGNED 02/09/2014 9:37

## 2014-12-19 ENCOUNTER — Encounter: Payer: Self-pay | Admitting: Internal Medicine

## 2014-12-19 ENCOUNTER — Other Ambulatory Visit: Payer: Self-pay | Admitting: Family Medicine

## 2014-12-19 NOTE — Telephone Encounter (Signed)
[name] MessierKathy pts daughter left v/m requesting [facility]; pt is presently taking one tab twice a day. [name] MessierKathy wants to know if OK to continue taking one tab bid.Please advise.

## 2014-12-19 NOTE — Progress Notes (Signed)
This encounter was created in error - please disregard.

## 2014-12-24 IMAGING — CR DG HAND COMPLETE 3+V*L*
3 series · 3 of 3 positions shown · non-contrast
Comparison: None.

CLINICAL DATA: hand pain and swelling after a fall - medial L hand

EXAM:
LEFT HAND - COMPLETE 3+ VIEW

[view not recorded (1 of 3)]
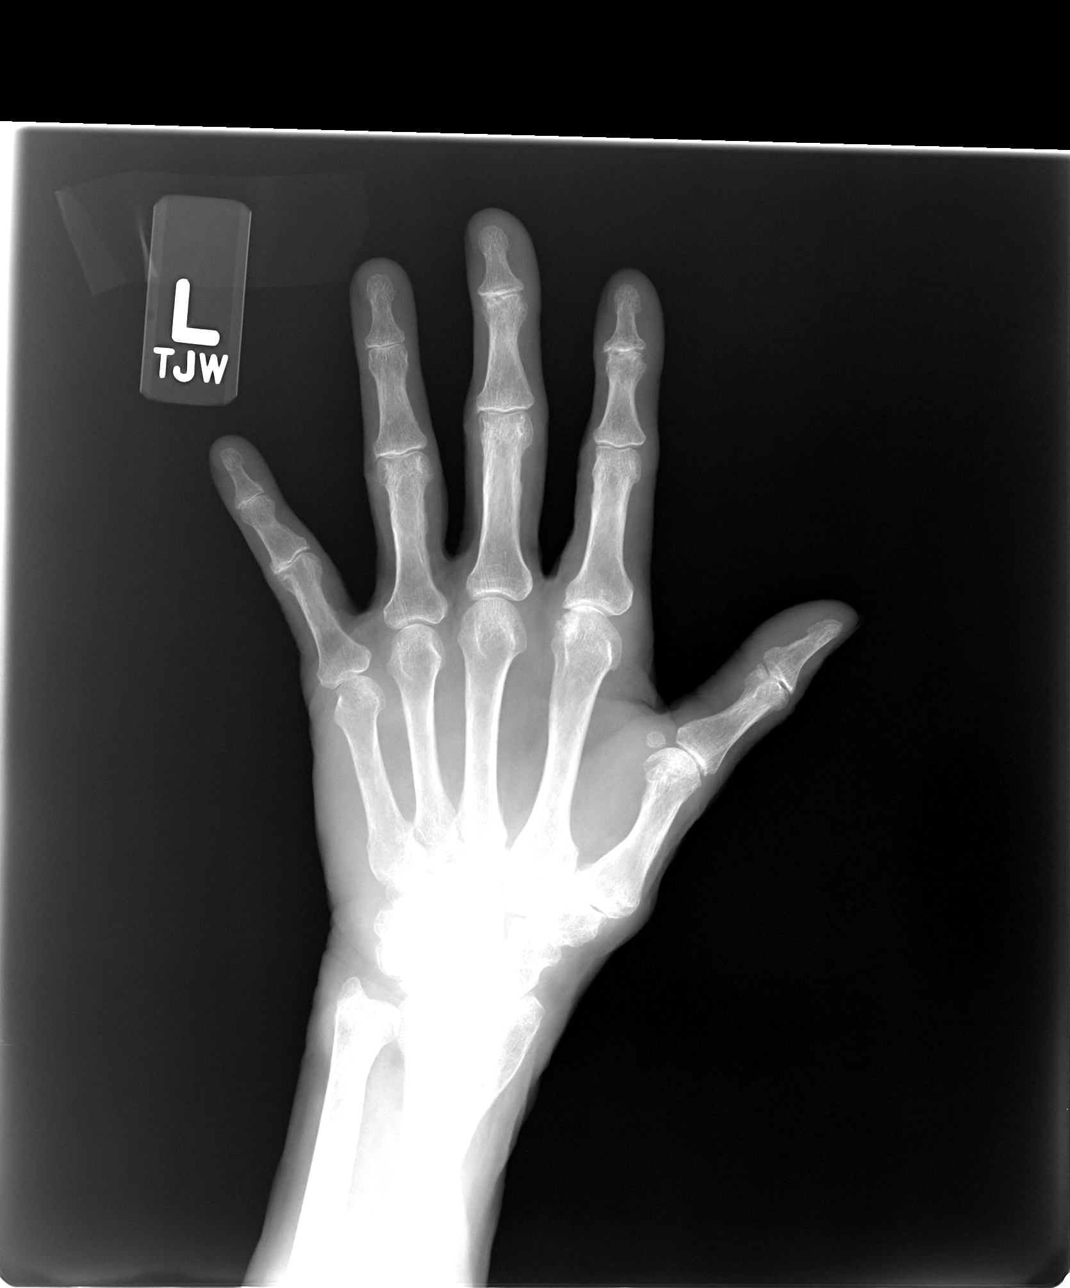

[view not recorded (2 of 3)]
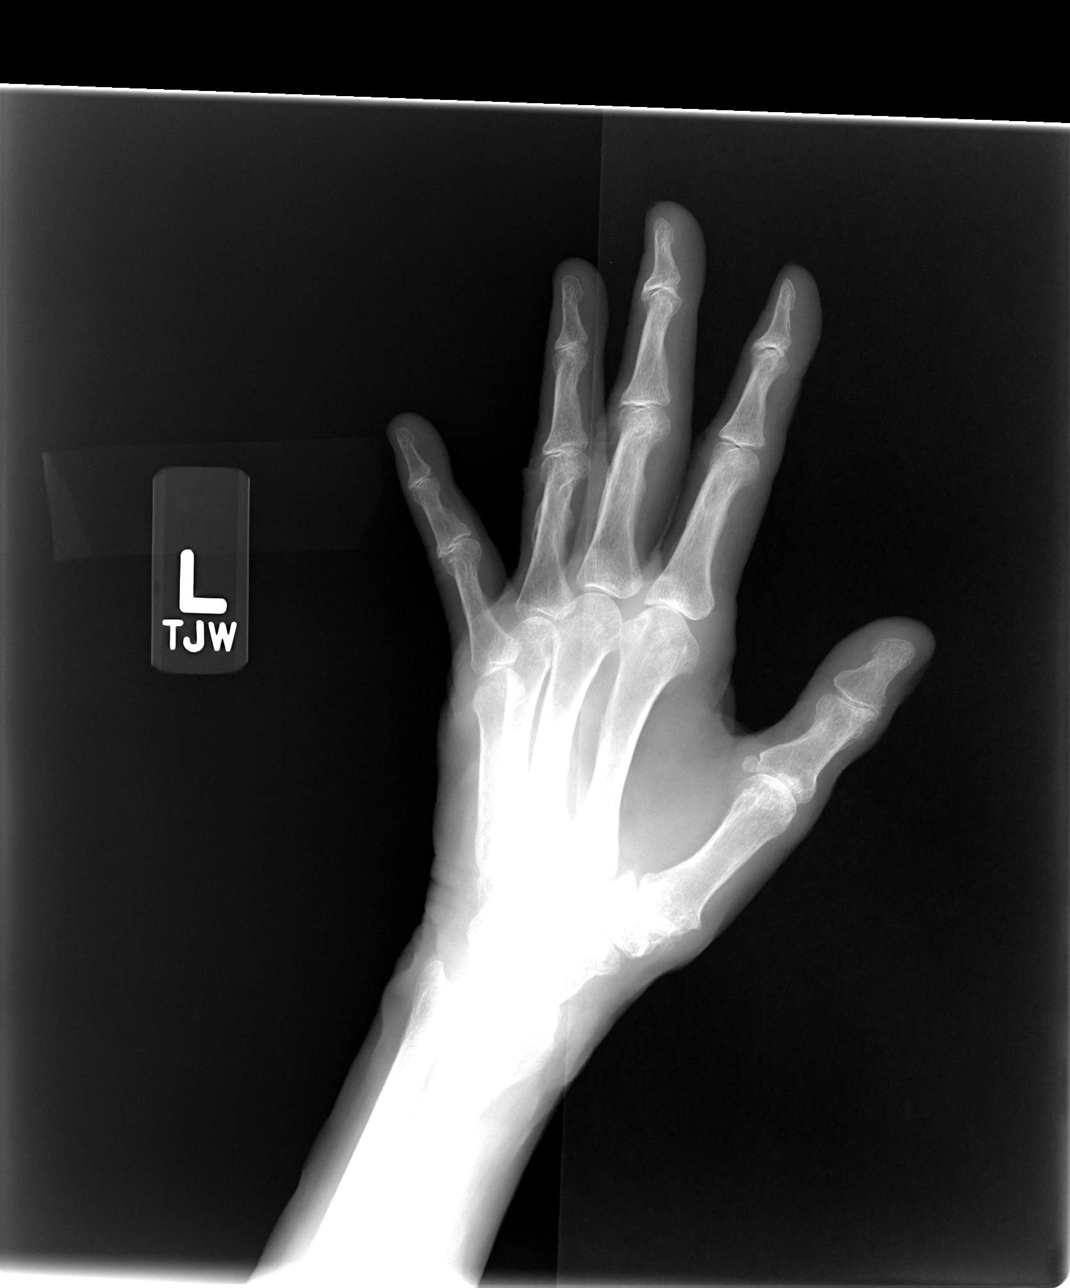

[view not recorded (3 of 3)]
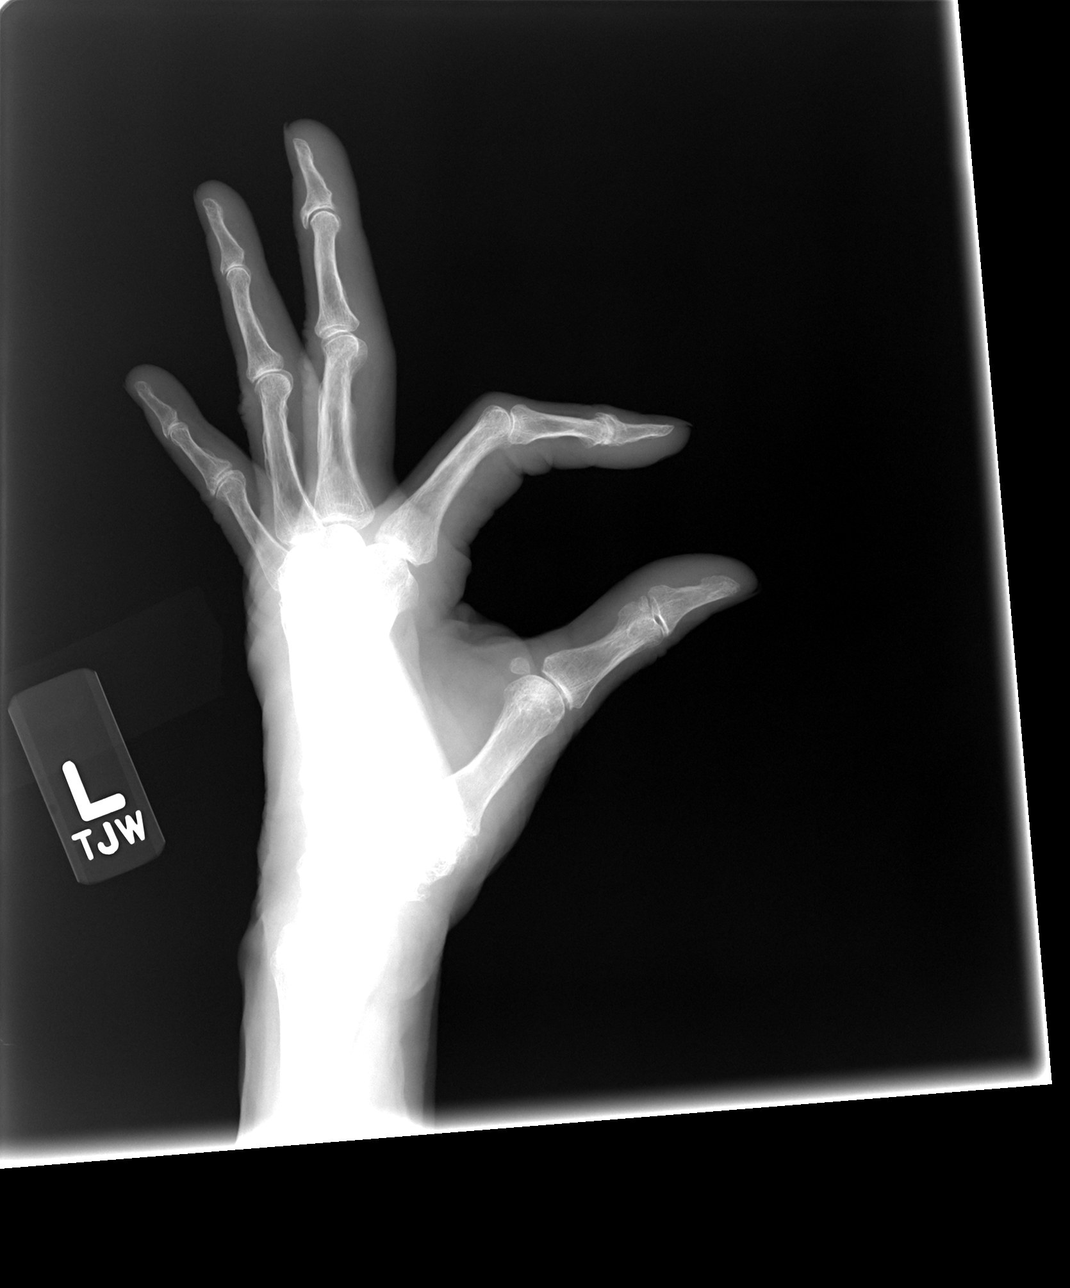

[3 of 3 positions shown; findings below may reference images not displayed]

FINDINGS: There is no evidence of acute fracture nor dislocation. Areas of
joint space narrowing, periarticular sclerosis and mild hypertrophic
spurring appreciated involving the distal interphalangeal joints,
first carpometacarpal articulation, and metacarpal phalangeal joint
of the first digit. Findings most severe involving the first carpal
metacarpal articulation.
IMPRESSION: Areas of osteoarthritic change involving the proximal and distal
interphalangeal joints as well as the first metacarpal trapezial
articulation. There is no evidence of acute osseous abnormalities.

## 2014-12-25 ENCOUNTER — Other Ambulatory Visit: Payer: Self-pay | Admitting: Family Medicine

## 2014-12-30 ENCOUNTER — Telehealth: Payer: Self-pay | Admitting: Family Medicine

## 2014-12-30 NOTE — Telephone Encounter (Signed)
Patient Name: [name] SpragueVIRGINIA Kluck DOB: 01/03/1923 Initial Comment Caller states she feels very weak. Nurse Assessment Nurse: Elijah Birkaldwell, RN, Lynda Date/Time (Eastern Time): 12/30/2014 10:49:32 AM Confirm and document reason for call. If symptomatic, describe symptoms. ---Caller states she feels very weak, no energy. Has not been feeling well over the last couple days. No fever. Has the patient traveled out of the country within the last 30 days? ---Not Applicable Does the patient require triage? ---Yes Related visit to physician within the last 2 weeks? ---No Does the PT have any chronic conditions? (i.e. diabetes, asthma, etc.) ---Yes List chronic conditions. ---high BP rx Guidelines Guideline Title Affirmed Question Affirmed Notes Weakness (Generalized) and Fatigue [1] MODERATE weakness (i.e., interferes with work, school, normal activities) AND [2] cause unknown (Exceptions: weakness with acute minor illness, or weakness from poor fluid intake) Final Disposition User See Physician within 4 Hours (or PCP triage) Elijah Birkaldwell, RN, Lynda Comments Caller only wants to see her Dr., not another provider. Nurse unable to find availability on schedule. Caller would like a call back if there is an opening to be seen by her Dr. today.

## 2014-12-30 NOTE — Telephone Encounter (Signed)
I am full /even during lunch - she should be seen by [facility] - cannot do much if she declines  Enc fluids/ask if any more specific symptoms or new ones develop and we can watch for cancellations on my schedule

## 2014-12-30 NOTE — Telephone Encounter (Signed)
Pt notified of Dr. Royden Purlower's comments/recommendations. Pt said she doesn't have any other sxs except weakness and fatigue. I offered to schedule appt for Monday with Dr. Milinda Antisower and pt declined. She said she will wait, I advise pt if sxs worsen or if she develops any new sxs [facility] or let us know

## 2015-01-16 ENCOUNTER — Telehealth: Payer: Self-pay

## 2015-01-16 NOTE — Telephone Encounter (Signed)
[name] MessierKathy pts daughter (DPR signed) left v/m; pt thinks Trazodone is causing night mares and [facility] Any cb needed [name] MessierKathy request the cb (pts memory has worsened.) Pt has appt on 01/17/15 at 9 am to see Dr Milinda Antisower.Please advise.

## 2015-01-16 NOTE — Telephone Encounter (Signed)
Please have her f/u to discuss other options for treatment - I want to get more details re: the nightmares and her usual sleeping habits to help make a choice and make sure it is safe Thanks

## 2015-01-17 ENCOUNTER — Encounter: Payer: Self-pay | Admitting: Family Medicine

## 2015-01-17 ENCOUNTER — Ambulatory Visit (INDEPENDENT_AMBULATORY_CARE_PROVIDER_SITE_OTHER): Payer: Commercial Managed Care - HMO | Admitting: Family Medicine

## 2015-01-17 VITALS — BP 118/78 | HR 86 | Temp 98.2°F | Ht 62.0 in | Wt 126.2 lb

## 2015-01-17 DIAGNOSIS — G4700 Insomnia, unspecified: Secondary | ICD-10-CM

## 2015-01-17 NOTE — Progress Notes (Signed)
Subjective:    Patient ID: [name] DingwallVirginia Y [name], female    DOB: 06/24/1923, 81 y.o.   MRN: [id number]  HPI Here for sleeping problems   She was taking trazadone for a while  She has nightmares on it - some bad and some not  She is able to go back to sleep after a nightmare  One per night average   Helped sleep = not that well   Goes to bed after 11  Usually gets up at 7:00  For breakfast has 2 cups of coffee - mug sized - does not make her jittery   Sometimes she feels like she has not slept all night  Thinks she has sleep problems about half of the week  Biggest problem- is falling asleep/not staying asleep   On a bad night - take 2 hours to go to sleep (she does not get back up)  No issues with temp or light  She watches TV in bed   Usually eats around 6 pm  Does not snack at night  Not usually hungry  More active/ more exercise   No more pain issues  Thinks the problem is anxiety-cannot relax  No particular worries-just cannot relax  No major stresses right now   Has never tried any over the counter meds for sleep  No px meds besides trazadone and zoloft   Was on xanax in the past for anxiety -  ? If it helped her sleep   No falls lately   Gets up once to urinate at night   No naps   Patient Active Problem List   Diagnosis Date Noted  . DJD (degenerative joint disease) 10/11/2014  . Memory loss 10/05/2014  . Fatigue 10/05/2014  . B12 deficiency 10/05/2014  . Preoperative examination 09/13/2014  . Insomnia 08/02/2014  . Hot flashes 08/02/2014  . Other malaise and fatigue 04/06/2014  . Rectal bleeding 03/24/2014  . Personal history of arteriovenous malformation (AVM) 03/24/2014  . Lesion of throat 01/18/2014  . Left hip pain 01/10/2014  . Hip osteoarthritis 01/10/2014  . Osteopenia 11/11/2013  . Encounter for Medicare annual wellness exam 11/10/2013  . Hand pain, left 11/10/2013  . Fall due to stumbling 11/01/2013  . Contusion 11/01/2013  .  Pacemaker -Medtronic 07/20/2013  . Angiodysplasia of colon with hemorrhage 06/25/2013  . Atrial fibrillation 06/06/2011  . Anxiety state 06/16/2007  . DEPRESSION 06/16/2007  . Essential hypertension 06/16/2007  . GERD 06/16/2007   Past Medical History  Diagnosis Date  . Allergic rhinitis   . Anxiety   . Depression   . GERD (gastroesophageal reflux disease)   . Hypertension   . Osteopenia   . Bradycardia   . Hyperlipidemia   . Duodenal ulcer   . Other specified cardiac dysrhythmias(427.89)   . Diverticulosis   . GI bleed   . Atrial fibrillation   . Anemia associated with acute blood loss   . Diverticulosis   . Shortness of breath dyspnea     with exertion  . Presence of permanent cardiac pacemaker    Past Surgical History  Procedure Laterality Date  . Appendectomy    . Total abdominal hysterectomy    . Tonsillectomy    . Ankle fracture surgery Right 05/2002  . Insert / replace / remove pacemaker    . Cardiac defibrillator placement    . Esophagogastroduodenoscopy N/A 06/24/2013    Procedure: ESOPHAGOGASTRODUODENOSCOPY (EGD);  Surgeon: Hilarie FredricksonJohn N Perry, MD;  [facility];  Service: Endoscopy;  Laterality: N/A;  . Colonoscopy N/A 06/25/2013    Procedure: COLONOSCOPY;  Surgeon: Hilarie Fredrickson, MD;  Location: [facility];  Service: Endoscopy;  Laterality: N/A;  . Total hip arthroplasty Left 10/11/2014    DR MURPHY  . Total hip arthroplasty Left 10/11/2014    Procedure: TOTAL HIP ARTHROPLASTY ANTERIOR APPROACH;  Surgeon: Sheral Apley, MD;  Location: [facility];  Service: Orthopedics;  Laterality: Left;   History  Substance Use Topics  . Smoking status: Never Smoker   . Smokeless tobacco: Never Used  . Alcohol Use: No     Comment: infrequently-rare   Family History  Problem Relation Age of Onset  . Heart attack Father   . Depression Mother   . Alzheimer's disease Mother   . Pneumonia Mother   . Heart disease Brother     hx of CABG   Allergies  Allergen  Reactions  . Ace Inhibitors     REACTION: cough  . Alendronate Sodium     REACTION: aches  . Avapro [Irbesartan]     hypotension  . Codeine     Dizzy and nauseated   . Dabigatran Etexilate Mesylate Diarrhea    indigestion  . Nitrofurantoin     REACTION: rash  . Penicillins     REACTION: rash  . Prednisone Other (See Comments)    Extreme weakness   Current Outpatient Prescriptions on File Prior to Visit  Medication Sig Dispense Refill  . aspirin EC 325 MG tablet Take 1 tablet (325 mg total) by mouth daily. 30 tablet 0  . Cholecalciferol (VITAMIN D3) 2000 UNITS TABS Take 1 capsule by mouth daily.    . [facility] SENNA 8.6 MG tablet TAKE 2 TABLETS BY MOUTH TWICE A DAY AT 8AM AND ALSP AT 5PM 120 tablet 11  . docusate sodium (COLACE) 100 MG capsule Take 1 capsule (100 mg total) by mouth 2 (two) times daily. Continue this while taking narcotics to help with bowel movements 30 capsule 1  . hydrochlorothiazide (HYDRODIURIL) 25 MG tablet TAKE 1 TABLET BY MOUTH ONCE A DAY 30 tablet 11  . metoprolol tartrate (LOPRESSOR) 25 MG tablet Take 1 tablet (25 mg total) by mouth 2 (two) times daily. 180 tablet 3  . Multiple Vitamin (MULTIVITAMIN) tablet Take 1 tablet by mouth daily.      Marland Kitchen omeprazole (PRILOSEC) 20 MG capsule TAKE 1 CAPSULE EVERY DAY 90 capsule 3  . ondansetron (ZOFRAN) 4 MG tablet Take 1 tablet (4 mg total) by mouth every 8 (eight) hours as needed for nausea. 60 tablet 0  . sertraline (ZOLOFT) 100 MG tablet Take 1 tablet (100 mg total) by mouth daily. 90 tablet 3  . traZODone (DESYREL) 50 MG tablet Take 1 tablet (50 mg total) by mouth at bedtime. 90 tablet 3   No current facility-administered medications on file prior to visit.      Review of Systems Review of Systems  Constitutional: Negative for fever, appetite change, fatigue and unexpected weight change.  Eyes: Negative for pain and visual disturbance.  Respiratory: Negative for cough and shortness of breath.   Cardiovascular:  Negative for cp or palpitations    Gastrointestinal: Negative for nausea, diarrhea and constipation.  Genitourinary: Negative for urgency and frequency.  Skin: Negative for pallor or rash   Neurological: Negative for weakness, light-headedness, numbness and headaches.  Hematological: Negative for adenopathy. Does not bruise/bleed easily.  Psychiatric/Behavioral: Negative for dysphoric mood. The patient is occ anxious and frustrated, pos for difficulty falling asleep.  Objective:   Physical Exam  Constitutional: She appears well-developed and well-nourished. No distress.  Well appearing elderly female   HENT:  Head: Normocephalic and atraumatic.  Mouth/Throat: Oropharynx is clear and moist.  Eyes: Conjunctivae and EOM are normal. Pupils are equal, round, and reactive to light. No scleral icterus.  Neck: Normal range of motion. Neck supple. No JVD present. Carotid bruit is not present. No thyromegaly present.  Cardiovascular: Normal rate and regular rhythm.   Pulmonary/Chest: Effort normal and breath sounds normal. No respiratory distress. She has no wheezes. She has no rales.  Abdominal: Soft. Bowel sounds are normal.  Musculoskeletal: She exhibits no edema or tenderness.  Lymphadenopathy:    She has no cervical adenopathy.  Neurological: She is alert. She has normal reflexes. No cranial nerve deficit. She exhibits normal muscle tone. Coordination normal.  No tremor   Skin: Skin is warm and dry. No pallor.  Psychiatric: She has a normal mood and affect. Her behavior is normal. Thought content normal.          Assessment & Plan:   Problem List Items Addressed This Visit    Insomnia - Primary    Ongoing problem - suspect anxiety related  Trouble falling asleep  Intol of trazadone-causes nightmares  Disc sleep hygiene  Reviewed stressors/ coping techniques/symptoms/ support sources/ tx options and side effects in detail today (doing well with zoloft) Disc risk of  dizziness and falls with all sleep products  Recommended cautious trial with benadryl first 25 mg -- with caution of falls/ handout given  If not effective, will try xanax again-disc habit and fall pot for this

## 2015-01-17 NOTE — Telephone Encounter (Signed)
Pt has f/u scheduled for today. 

## 2015-01-17 NOTE — Patient Instructions (Signed)
For sleep on the nights you need it - try benadryl first 25 mg over the counter  If you cannot fall asleep in 30 minutes -go ahead and take a benadryl  If this does not work - we can try xanax .5 mg - same protocol-take it you cannot fall asleep in 30 minutes (call if the benadryl does not work after trying it and we will call in xanax) Both of these medicines can cause falls so use great caution  Do not watch TV in bed  Try to cut caffeine in the am  Stay active

## 2015-01-17 NOTE — Progress Notes (Signed)
Pre visit review using our clinic review tool, if applicable. No additional management support is needed unless otherwise documented below in the visit note. 

## 2015-01-18 NOTE — Assessment & Plan Note (Signed)
Ongoing problem - suspect anxiety related  Trouble falling asleep  Intol of trazadone-causes nightmares  Disc sleep hygiene  Reviewed stressors/ coping techniques/symptoms/ support sources/ tx options and side effects in detail today (doing well with zoloft) Disc risk of dizziness and falls with all sleep products  Recommended cautious trial with benadryl first 25 mg -- with caution of falls/ handout given  If not effective, will try xanax again-disc habit and fall pot for this

## 2015-02-07 ENCOUNTER — Telehealth: Payer: Self-pay

## 2015-02-07 ENCOUNTER — Telehealth: Payer: Self-pay | Admitting: Family Medicine

## 2015-02-07 NOTE — Telephone Encounter (Signed)
Opened in error

## 2015-02-07 NOTE — Telephone Encounter (Signed)
[name] pts daughter (DPR signed) said [facility] for [facility]ine; spoke with Vernona Rieger at [facility] and pt has refill available for sertraline and will get ready for pick up. Vernona Rieger said the denial came from Dr Ewell Poe office. [name] said she thought that was the dr when pt was in rehab. [name] will pick up rx.

## 2015-03-15 ENCOUNTER — Ambulatory Visit (INDEPENDENT_AMBULATORY_CARE_PROVIDER_SITE_OTHER): Payer: Commercial Managed Care - HMO | Admitting: Family Medicine

## 2015-03-15 ENCOUNTER — Encounter: Payer: Self-pay | Admitting: Family Medicine

## 2015-03-15 VITALS — BP 104/66 | HR 85 | Temp 98.5°F | Ht 62.0 in | Wt 126.0 lb

## 2015-03-15 DIAGNOSIS — R5383 Other fatigue: Secondary | ICD-10-CM | POA: Diagnosis not present

## 2015-03-15 DIAGNOSIS — G4700 Insomnia, unspecified: Secondary | ICD-10-CM | POA: Diagnosis not present

## 2015-03-15 DIAGNOSIS — R5381 Other malaise: Secondary | ICD-10-CM | POA: Diagnosis not present

## 2015-03-15 DIAGNOSIS — F4323 Adjustment disorder with mixed anxiety and depressed mood: Secondary | ICD-10-CM

## 2015-03-15 LAB — CBC WITH DIFFERENTIAL/PLATELET
BASOS ABS: 0.1 10*3/uL (ref 0.0–0.1)
BASOS PCT: 0.8 % (ref 0.0–3.0)
EOS ABS: 0.1 10*3/uL (ref 0.0–0.7)
Eosinophils Relative: 1.7 % (ref 0.0–5.0)
HCT: 41.4 % (ref 36.0–46.0)
Hemoglobin: 13.7 g/dL (ref 12.0–15.0)
LYMPHS PCT: 33.5 % (ref 12.0–46.0)
Lymphs Abs: 2.4 10*3/uL (ref 0.7–4.0)
MCHC: 33.1 g/dL (ref 30.0–36.0)
MCV: 91.9 fl (ref 78.0–100.0)
MONO ABS: 0.7 10*3/uL (ref 0.1–1.0)
Monocytes Relative: 9.7 % (ref 3.0–12.0)
NEUTROS PCT: 54.3 % (ref 43.0–77.0)
Neutro Abs: 3.8 10*3/uL (ref 1.4–7.7)
PLATELETS: 327 10*3/uL (ref 150.0–400.0)
RBC: 4.51 Mil/uL (ref 3.87–5.11)
RDW: 16.3 % — ABNORMAL HIGH (ref 11.5–15.5)
WBC: 7.1 10*3/uL (ref 4.0–10.5)

## 2015-03-15 LAB — COMPREHENSIVE METABOLIC PANEL
ALK PHOS: 108 U/L (ref 39–117)
ALT: 21 U/L (ref 0–35)
AST: 36 U/L (ref 0–37)
Albumin: 3.5 g/dL (ref 3.5–5.2)
BUN: 27 mg/dL — ABNORMAL HIGH (ref 6–23)
CALCIUM: 9.7 mg/dL (ref 8.4–10.5)
CHLORIDE: 99 meq/L (ref 96–112)
CO2: 36 mEq/L — ABNORMAL HIGH (ref 19–32)
Creatinine, Ser: 1.11 mg/dL (ref 0.40–1.20)
GFR: 49.12 mL/min — ABNORMAL LOW (ref >60.00)
Glucose, Bld: 90 mg/dL (ref 70–99)
POTASSIUM: 4 meq/L (ref 3.5–5.1)
Sodium: 139 mEq/L (ref 135–145)
Total Bilirubin: 0.3 mg/dL (ref 0.2–1.2)
Total Protein: 6.6 g/dL (ref 6.0–8.3)

## 2015-03-15 LAB — POCT URINALYSIS DIPSTICK
Glucose, UA: NEGATIVE
LEUKOCYTES UA: NEGATIVE
Nitrite, UA: NEGATIVE
PH UA: 6
RBC UA: NEGATIVE
Urobilinogen, UA: 0.2

## 2015-03-15 LAB — VITAMIN B12: Vitamin B-12: 586 pg/mL (ref 211–911)

## 2015-03-15 LAB — TSH: TSH: 2.99 u[IU]/mL (ref 0.35–4.50)

## 2015-03-15 NOTE — Progress Notes (Signed)
Pre visit review using our clinic review tool, if applicable. No additional management support is needed unless otherwise documented below in the visit note. 

## 2015-03-15 NOTE — Patient Instructions (Signed)
Labs today for fatigue  Also urinalysis If everything is normal and you still feel down we may need to discuss other depression and insomnia treatments Eat regular meals and make sure to drink fluids

## 2015-03-15 NOTE — Assessment & Plan Note (Signed)
This may add to fatigue/malaise and insomnia  If w/u is neg -consider trial of mirtazapine  Had nightmares with trazadone Still on zoloft

## 2015-03-15 NOTE — Assessment & Plan Note (Signed)
Ongoing Benadryl was only temporarily helpful  Did not tolerate trazadone  ? Consider mirtazapine for this and dep (pending lab today)

## 2015-03-15 NOTE — Progress Notes (Signed)
Subjective:    Patient ID: [name] [name], female    DOB: 1922/12/19, 81 y.o.   MRN: [id number]  HPI Here with weakness -all over /not feeling well and no energy  One week  No urinary symptoms Has not fallen  No cold symptoms (baseline allergies)  No n/v/d no abd pain   None of her medicines have changed   A little depressed (because she is tired)  No different from usual symptoms from that   Has insomnia - tried the benadryl/ did not help for very long  Did not go back to the xanax   Not getting a lot of sleep  Has never been able to nap   Appetite is a little less-nothing major    No longer doing PT for her hip -that feels fine   Lab Results  Component Value Date   TSH 2.65 10/05/2014   Lab Results  Component Value Date   VITAMINB12 289 10/05/2014     Patient Active Problem List   Diagnosis Date Noted  . DJD (degenerative joint disease) 10/11/2014  . Memory loss 10/05/2014  . Fatigue 10/05/2014  . B12 deficiency 10/05/2014  . Preoperative examination 09/13/2014  . Insomnia 08/02/2014  . Hot flashes 08/02/2014  . Malaise and fatigue 04/06/2014  . Rectal bleeding 03/24/2014  . Personal history of arteriovenous malformation (AVM) 03/24/2014  . Lesion of throat 01/18/2014  . Left hip pain 01/10/2014  . Hip osteoarthritis 01/10/2014  . Osteopenia 11/11/2013  . Encounter for Medicare annual wellness exam 11/10/2013  . Hand pain, left 11/10/2013  . Fall due to stumbling 11/01/2013  . Contusion 11/01/2013  . Pacemaker -Medtronic 07/20/2013  . Angiodysplasia of colon with hemorrhage 06/25/2013  . Atrial fibrillation 06/06/2011  . Anxiety state 06/16/2007  . Adjustment disorder with mixed anxiety and depressed mood 06/16/2007  . Essential hypertension 06/16/2007  . GERD 06/16/2007   Past Medical History  Diagnosis Date  . Allergic rhinitis   . Anxiety   . Depression   . GERD (gastroesophageal reflux disease)   . Hypertension   . Osteopenia   .  Bradycardia   . Hyperlipidemia   . Duodenal ulcer   . Other specified cardiac dysrhythmias(427.89)   . Diverticulosis   . GI bleed   . Atrial fibrillation   . Anemia associated with acute blood loss   . Diverticulosis   . Shortness of breath dyspnea     with exertion  . Presence of permanent cardiac pacemaker    Past Surgical History  Procedure Laterality Date  . Appendectomy    . Total abdominal hysterectomy    . Tonsillectomy    . Ankle fracture surgery Right 05/2002  . Insert / replace / remove pacemaker    . Cardiac defibrillator placement    . Esophagogastroduodenoscopy N/A 06/24/2013    Procedure: ESOPHAGOGASTRODUODENOSCOPY (EGD);  Surgeon: Hilarie Fredrickson, MD;  Location[facility];  Service: Endoscopy;  Laterality: N/A;  . Colonoscopy N/A 06/25/2013    Procedure: COLONOSCOPY;  Surgeon: Hilarie Fredrickson, MD;  Location: [facility];  Service: Endoscopy;  Laterality: N/A;  . Total hip arthroplasty Left 10/11/2014    DR MURPHY  . Total hip arthroplasty Left 10/11/2014    Procedure: TOTAL HIP ARTHROPLASTY ANTERIOR APPROACH;  Surgeon: Sheral Apley, MD;  Location: [facility];  Service: Orthopedics;  Laterality: Left;   History  Substance Use Topics  . Smoking status: Never Smoker   . Smokeless tobacco: Never Used  . Alcohol Use: No  Comment: infrequently-rare   Family History  Problem Relation Age of Onset  . Heart attack Father   . Depression Mother   . Alzheimer's disease Mother   . Pneumonia Mother   . Heart disease Brother     hx of CABG   Allergies  Allergen Reactions  . Ace Inhibitors     REACTION: cough  . Alendronate Sodium     REACTION: aches  . Avapro [Irbesartan]     hypotension  . Codeine     Dizzy and nauseated   . Dabigatran Etexilate Mesylate Diarrhea    indigestion  . Nitrofurantoin     REACTION: rash  . Penicillins     REACTION: rash  . Prednisone Other (See Comments)    Extreme weakness  . Trazodone And Nefazodone Other (See Comments)     Nightmares    Current Outpatient Prescriptions on File Prior to Visit  Medication Sig Dispense Refill  . aspirin EC 325 MG tablet Take 1 tablet (325 mg total) by mouth daily. 30 tablet 0  . Cholecalciferol (VITAMIN D3) 2000 UNITS TABS Take 1 capsule by mouth daily.    . [facility] SENNA 8.6 MG tablet TAKE 2 TABLETS BY MOUTH TWICE A DAY AT 8AM AND ALSP AT 5PM 120 tablet 11  . docusate sodium (COLACE) 100 MG capsule Take 1 capsule (100 mg total) by mouth 2 (two) times daily. Continue this while taking narcotics to help with bowel movements 30 capsule 1  . hydrochlorothiazide (HYDRODIURIL) 25 MG tablet TAKE 1 TABLET BY MOUTH ONCE A DAY 30 tablet 11  . metoprolol tartrate (LOPRESSOR) 25 MG tablet Take 1 tablet (25 mg total) by mouth 2 (two) times daily. 180 tablet 3  . Multiple Vitamin (MULTIVITAMIN) tablet Take 1 tablet by mouth daily.      Marland Kitchen omeprazole (PRILOSEC) 20 MG capsule TAKE 1 CAPSULE EVERY DAY 90 capsule 3  . ondansetron (ZOFRAN) 4 MG tablet Take 1 tablet (4 mg total) by mouth every 8 (eight) hours as needed for nausea. 60 tablet 0  . sertraline (ZOLOFT) 100 MG tablet Take 1 tablet (100 mg total) by mouth daily. 90 tablet 3   No current facility-administered medications on file prior to visit.    Review of Systems Review of Systems  Constitutional: Negative for fever, appetite change, fatigue and unexpected weight change.  Eyes: Negative for pain and visual disturbance.  Respiratory: Negative for cough and shortness of breath.   Cardiovascular: Negative for cp or palpitations    Gastrointestinal: Negative for nausea, diarrhea and constipation.  Genitourinary: Negative for urgency and frequency.  Skin: Negative for pallor or rash   Neurological: Negative for weakness, light-headedness, numbness and headaches.  Hematological: Negative for adenopathy. Does not bruise/bleed easily.  Psychiatric/Behavioral: pos for depression that is helped with medication, pos for mild anxiety, pos for  insomnia         Objective:   Physical Exam  Constitutional: She appears well-developed and well-nourished. No distress.  HENT:  Head: Normocephalic and atraumatic.  Mouth/Throat: Oropharynx is clear and moist.  Eyes: Conjunctivae and EOM are normal. Pupils are equal, round, and reactive to light.  Neck: Normal range of motion. Neck supple. No JVD present. Carotid bruit is not present. No thyromegaly present.  Cardiovascular: Normal rate, regular rhythm, normal heart sounds and intact distal pulses.  Exam reveals no gallop.   Pulmonary/Chest: Effort normal and breath sounds normal. No respiratory distress. She has no wheezes. She has no rales.  No crackles  Abdominal:  Soft. Bowel sounds are normal. She exhibits no distension, no abdominal bruit and no mass. There is no tenderness.  Musculoskeletal: She exhibits no edema.  Lymphadenopathy:    She has no cervical adenopathy.  Neurological: She is alert. She has normal reflexes. She displays no tremor. No cranial nerve deficit or sensory deficit. She exhibits normal muscle tone. Coordination and gait normal.  Skin: Skin is warm and dry. No rash noted. No pallor.  Psychiatric: She has a normal mood and affect.          Assessment & Plan:   Problem List Items Addressed This Visit    Adjustment disorder with mixed anxiety and depressed mood - Primary    This may add to fatigue/malaise and insomnia  If w/u is neg -consider trial of mirtazapine  Had nightmares with trazadone Still on zoloft       Insomnia    Ongoing Benadryl was only temporarily helpful  Did not tolerate trazadone  ? Consider mirtazapine for this and dep (pending lab today)      Malaise and fatigue    Recurrent  Likely multifactorial -though pain is no longer present  Lab today incl B12 and tsh Also ua  Disc depression and insomnia as well   If all is neg/no improvement - thinking about trial of mirtazapine        Relevant Orders   CBC with  Differential/Platelet (Completed)   Comprehensive metabolic panel (Completed)   TSH (Completed)   Vitamin B12 (Completed)   POCT urinalysis dipstick (Completed)

## 2015-03-15 NOTE — Assessment & Plan Note (Signed)
Recurrent  Likely multifactorial -though pain is no longer present  Lab today incl B12 and tsh Also ua  Disc depression and insomnia as well   If all is neg/no improvement - thinking about trial of mirtazapine

## 2015-04-18 ENCOUNTER — Telehealth: Payer: Self-pay

## 2015-04-18 NOTE — Telephone Encounter (Signed)
If she can - I would like to see her for an appointment to do a mini mental status exam (a question and answer exam for memory and concentration) so we can get a score on that for her program  Also to check in re; how she is doing  Thanks

## 2015-04-18 NOTE — Telephone Encounter (Signed)
[name] [name] left v/m(DPR signed); pts memory has worsened; pt cannot remember to take her meds and may repeat the same thing 3 times. and [employer] had sent someone out to do evaluation of pts needs. Pt has long term care policy with [employer]. [employer] is going to request more info from Dr Milinda Antis and [name] wants to know if Dr Milinda Antis needs to see pt before sending info to [employer]. [name] request cb. Pt last seen 03/15/2015.

## 2015-04-18 NOTE — Telephone Encounter (Signed)
Spoke with daughter and advised pt would need appt, transferred call to scheduling to get appt

## 2015-05-30 ENCOUNTER — Ambulatory Visit (INDEPENDENT_AMBULATORY_CARE_PROVIDER_SITE_OTHER): Payer: Commercial Managed Care - HMO | Admitting: Family Medicine

## 2015-05-30 ENCOUNTER — Encounter: Payer: Self-pay | Admitting: Family Medicine

## 2015-05-30 VITALS — BP 122/80 | HR 88 | Temp 98.1°F | Ht 62.0 in | Wt 122.5 lb

## 2015-05-30 DIAGNOSIS — F0390 Unspecified dementia without behavioral disturbance: Secondary | ICD-10-CM

## 2015-05-30 DIAGNOSIS — F4323 Adjustment disorder with mixed anxiety and depressed mood: Secondary | ICD-10-CM | POA: Diagnosis not present

## 2015-05-30 MED ORDER — MIRTAZAPINE 7.5 MG PO TABS: 7.5000 mg | ORAL_TABLET | Freq: Every day | ORAL | Status: DC

## 2015-05-30 NOTE — Assessment & Plan Note (Addendum)
This seems to be progressing -cognitive impairment has become moderate to severe  Has seen Dr Malvin Johns and tolerates aricept and namenda so far - but pt and family are interested in further eval with perhaps more specific psychometric  testing - referral done for neuro at [facility] neuro She is now having substantial difficulty with ADLs and requires help preparing meals /food, bathing (including getting in and out of shower) and total help washing hair/ also now needing assistance with dressing (including buttons and pants)  Also requires not only reminders but observation /administration of medication since she cannot remember to take it  She also requires transportation and aid to leave the house   She also requires substantial supervision to protect her from threats to health and safety due to her cognitive impairment - for instance with mobility/especially getting in and out of bathroom without falling/ and prep meals (not safe to use stove or toaster)    Neuro ref made inst to continue aricept and namenda  Family is working together to care for her now and she would rather stay in the home as long as possible   >25 minutes spent in face to face time with patient, >50% spent in counselling or coordination of care

## 2015-05-30 NOTE — Assessment & Plan Note (Signed)
Lately this seems to be worsened by insomnia  She continues to sleep in the day time and be up at night  trazadone gives her bad dreams and she states it does not help sleep that much Disc a change to mirtazapine for sleep/mood and appetite (Discussed expectations of SSRI medication including time to effectiveness and mechanism of action, also poss of side effects (early and late)- including mental fuzziness, weight or appetite change, nausea and poss of worse dep or anxiety (even suicidal thoughts)  Pt voiced understanding and will stop med and update if this occurs )  Will try 7.5 mg at bedtime and family will observe early on for excess sedation or falls  Will update/ report back  Proceed with neuro ref for dementia -no doubt insomnia and mood disorder worsen her cognition slightly more

## 2015-05-30 NOTE — Progress Notes (Signed)
Pre visit review using our clinic review tool, if applicable. No additional management support is needed unless otherwise documented below in the visit note. 

## 2015-05-30 NOTE — Patient Instructions (Signed)
Stop trazadone Start mirtazapine at night for depression/appetite and sleep  If side effects - let me know  Watch for the first few nights  If too sedating we may change the dose  Stop at check out for referral to neurology   Keep me posted re: the [employer] coverage

## 2015-05-30 NOTE — Progress Notes (Signed)
Subjective:    Patient ID: [name] [name], female    DOB: 02/21/1923, 81 y.o.   MRN: [id number]  HPI Here for f/u of chronic medical problems incl mood disorder/insomnia/malaise and memory loss  Wt is down 4 lb from last visit  and down 9 lb from April bmi is 77  Saw Dr Malvin Johns (neuro) Scored 26/30 on MMS  Given aricept 10 and namenda 5-no side effects so far  Dx with mild alz     Family did not like him - wants a more complete eval  Would like [facility] [place]- interested   On asa 81 mg  They are trying to get [employer] (her long term care co) to help them out more  The co deny the claim   Having trouble with activities of daily living Pt cannot prepare food  Can eat when the food is prepared for her  Needs help with bathing -primarily getting in and out of the tub -safety  She needs reminders to take her meds  Unable to drive  Safety is a big issue   Really wants to stay at home as long as she can   Pays for an aide to come 2 days per week for 2 hours  Family and pt feel like she needs subst assist to perf 2 ADLs  Needs the most help with bathing (in and out of shower or tub and washing hair)  Prep/provide meals  Needs to "watch" her take her medicine  Needs minimal assistance with dressing Ok with transferring  [facility] with toileting  No incontinence  Can eat but not prep meals   Family feels like her cognitive impairment is worse than once though- mod to severe    Not sleeping well at night - but sleeps during the day  Tried trazadone -and it gave her nightmares- still giving her nightmares  Does not think it helps sleep Does not get up if she cannot sleep  Drinks 1 cup of coffee each am    Would consider a change to mirtazapine  Poor appetite -eats very small amts  Lost 9 lb since April   Patient Active Problem List   Diagnosis Date Noted  . DJD (degenerative joint disease) 10/11/2014  . Memory loss 10/05/2014  . Fatigue 10/05/2014    . B12 deficiency 10/05/2014  . Preoperative examination 09/13/2014  . Insomnia 08/02/2014  . Hot flashes 08/02/2014  . Malaise and fatigue 04/06/2014  . Rectal bleeding 03/24/2014  . Personal history of arteriovenous malformation (AVM) 03/24/2014  . Lesion of throat 01/18/2014  . Left hip pain 01/10/2014  . Hip osteoarthritis 01/10/2014  . Osteopenia 11/11/2013  . Encounter for Medicare annual wellness exam 11/10/2013  . Hand pain, left 11/10/2013  . Fall due to stumbling 11/01/2013  . Contusion 11/01/2013  . Pacemaker -Medtronic 07/20/2013  . Angiodysplasia of colon with hemorrhage 06/25/2013  . Atrial fibrillation (HCC) 06/06/2011  . Anxiety state 06/16/2007  . Adjustment disorder with mixed anxiety and depressed mood 06/16/2007  . Essential hypertension 06/16/2007  . GERD 06/16/2007   Past Medical History  Diagnosis Date  . Allergic rhinitis   . Anxiety   . Depression   . GERD (gastroesophageal reflux disease)   . Hypertension   . Osteopenia   . Bradycardia   . Hyperlipidemia   . Duodenal ulcer   . Other specified cardiac dysrhythmias(427.89)   . Diverticulosis   . GI bleed   . Atrial fibrillation (HCC)   .  Anemia associated with acute blood loss   . Diverticulosis   . Shortness of breath dyspnea     with exertion  . Presence of permanent cardiac pacemaker    Past Surgical History  Procedure Laterality Date  . Appendectomy    . Total abdominal hysterectomy    . Tonsillectomy    . Ankle fracture surgery Right 05/2002  . Insert / replace / remove pacemaker    . Cardiac defibrillator placement    . Esophagogastroduodenoscopy N/A 06/24/2013    Procedure: ESOPHAGOGASTRODUODENOSCOPY (EGD);  Surgeon: Hilarie Fredrickson, MD;  Location: [facility];  Service: Endoscopy;  Laterality: N/A;  . Colonoscopy N/A 06/25/2013    Procedure: COLONOSCOPY;  Surgeon: Hilarie Fredrickson, MD;  Location: [facility];  Service: Endoscopy;  Laterality: N/A;  . Total hip arthroplasty Left  10/11/2014    DR MURPHY  . Total hip arthroplasty Left 10/11/2014    Procedure: TOTAL HIP ARTHROPLASTY ANTERIOR APPROACH;  Surgeon: Sheral Apley, MD;  Location: [facility] OR;  Service: Orthopedics;  Laterality: Left;   Social History  Substance Use Topics  . Smoking status: Never Smoker   . Smokeless tobacco: Never Used  . Alcohol Use: No     Comment: infrequently-rare   Family History  Problem Relation Age of Onset  . Heart attack Father   . Depression Mother   . Alzheimer's disease Mother   . Pneumonia Mother   . Heart disease Brother     hx of CABG   Allergies  Allergen Reactions  . Ace Inhibitors     REACTION: cough  . Alendronate Sodium     REACTION: aches  . Avapro [Irbesartan]     hypotension  . Codeine     Dizzy and nauseated   . Dabigatran Etexilate Mesylate Diarrhea    indigestion  . Nitrofurantoin     REACTION: rash  . Penicillins     REACTION: rash  . Prednisone Other (See Comments)    Extreme weakness  . Trazodone And Nefazodone Other (See Comments)    Nightmares    Current Outpatient Prescriptions on File Prior to Visit  Medication Sig Dispense Refill  . Cholecalciferol (VITAMIN D3) 2000 UNITS TABS Take 1 capsule by mouth daily.    . [employer]S SENNA 8.6 MG tablet TAKE 2 TABLETS BY MOUTH TWICE A DAY AT 8AM AND ALSP AT 5PM 120 tablet 11  . hydrochlorothiazide (HYDRODIURIL) 25 MG tablet TAKE 1 TABLET BY MOUTH ONCE A DAY 30 tablet 11  . metoprolol tartrate (LOPRESSOR) 25 MG tablet Take 1 tablet (25 mg total) by mouth 2 (two) times daily. 180 tablet 3  . Multiple Vitamin (MULTIVITAMIN) tablet Take 1 tablet by mouth daily.      Marland Kitchen omeprazole (PRILOSEC) 20 MG capsule TAKE 1 CAPSULE EVERY DAY 90 capsule 3  . sertraline (ZOLOFT) 100 MG tablet Take 1 tablet (100 mg total) by mouth daily. 90 tablet 3   No current facility-administered medications on file prior to visit.             Review of Systems    Review of Systems  Constitutional: Negative for fever,  appetite change, and unexpected weight change. (pos for wt loss from decreased po intake and loss of appetite)  Eyes: Negative for pain and visual disturbance.  Respiratory: Negative for cough and shortness of breath.   Cardiovascular: Negative for cp or palpitations    Gastrointestinal: Negative for nausea, diarrhea and constipation.  Genitourinary: Negative for urgency and frequency.  neg for  incontinence  Skin: Negative for pallor or rash   Neurological: Negative for weakness, light-headedness, numbness and headaches. pos for symptoms of dementia with reduced ability to perform ADLS Hematological: Negative for adenopathy. Does not bruise/bleed easily.  Psychiatric/Behavioral: pos for dysphoric mood and anxiety , neg for SI, pos for dementia with loss of short term memory and decreased cognition     Objective:   Physical Exam  Constitutional: She appears well-developed and well-nourished. No distress.  frail appearing elderly female  Sitting quietly  occ repeats herself    HENT:  Head: Normocephalic and atraumatic.  Mouth/Throat: Oropharynx is clear and moist.  Eyes: Conjunctivae and EOM are normal. Pupils are equal, round, and reactive to light.  Neck: Normal range of motion. Neck supple. No JVD present. Carotid bruit is not present. No thyromegaly present.  Cardiovascular: Normal rate, regular rhythm, normal heart sounds and intact distal pulses.  Exam reveals no gallop.   Pulmonary/Chest: Effort normal and breath sounds normal. No respiratory distress. She has no wheezes. She has no rales.  No crackles  Abdominal: Soft. Bowel sounds are normal. She exhibits no abdominal bruit.  Musculoskeletal: She exhibits no edema.  Lymphadenopathy:    She has no cervical adenopathy.  Neurological: She is alert. She has normal reflexes. She displays no atrophy. No cranial nerve deficit. She exhibits normal muscle tone. Coordination and gait normal.  Skin: Skin is warm and dry. No rash noted.    Psychiatric: Her speech is normal and behavior is normal. Her affect is blunt. She is not agitated and not aggressive. Thought content is not paranoid. She exhibits a depressed mood. She expresses no homicidal and no suicidal ideation. She exhibits abnormal recent memory.  Fairly content to sit and listen Does not ask questions  Repeats herself occasionally           Assessment & Plan:   Problem List Items Addressed This Visit      Nervous and Auditory   Dementia - Primary    This seems to be progressing -cognitive impairment has become moderate to severe  Has seen Dr Malvin Johns and tolerates aricept and namenda so far - but pt and family are interested in further eval with perhaps more specific psychometric  testing - referral done for neuro at [facility] neuro She is now having substantial difficulty with ADLs and requires help preparing meals /food, bathing (including getting in and out of shower) and total help washing hair Also requires not only reminders but observation /administration of medication since she cannot remember to take it  She also requires transportation and aid to leave the house   She also requires substantial supervision to protect her from threats to health and safety due to her cognitive impairment - for instance with mobility/especially getting in and out of bathroom without falling/ and prep meals (not safe to use stove or toaster)    Neuro ref made inst to continue aricept and namenda  Family is working together to care for her now and she would rather stay in the home as long as possible   >25 minutes spent in face to face time with patient, >50% spent in counselling or coordination of care        Relevant Medications   memantine (NAMENDA) 5 MG tablet   donepezil (ARICEPT) 10 MG tablet   mirtazapine (REMERON) 7.5 MG tablet   Other Relevant Orders   Ambulatory referral to Neurology     Other   Adjustment disorder with mixed anxiety and  depressed mood     Lately this seems to be worsened by insomnia  She continues to sleep in the day time and be up at night  trazadone gives her bad dreams and she states it does not help sleep that much Disc a change to mirtazapine for sleep/mood and appetite (Discussed expectations of SSRI medication including time to effectiveness and mechanism of action, also poss of side effects (early and late)- including mental fuzziness, weight or appetite change, nausea and poss of worse dep or anxiety (even suicidal thoughts)  Pt voiced understanding and will stop med and update if this occurs )  Will try 7.5 mg at bedtime and family will observe early on for excess sedation or falls  Will update/ report back  Proceed with neuro ref for dementia -no doubt insomnia and mood disorder worsen her cognition slightly more

## 2015-05-31 ENCOUNTER — Telehealth: Payer: Self-pay | Admitting: Family Medicine

## 2015-05-31 NOTE — Telephone Encounter (Signed)
Daughter called-  When you write the note about pt, please focus on bathing and dressing. Preparation of meals is not a qualifying factor Daughter wants to hold off on neurological appt until she appeals long term care plan. cb number [phone]

## 2015-06-02 NOTE — Telephone Encounter (Signed)
Daughter notified and she didn't need the note she said she has to fill out some forms and when her ins request her OV as long as that info is in Dr. Royden Purl note then she is okay

## 2015-06-02 NOTE — Progress Notes (Signed)
   Subjective:    Patient ID: [name] [name], female    DOB: 1923-01-02, 81 y.o.   MRN: [id number]  HPI  I need to add that pt is also having significant problems dressing herself -primarily buttoning buttons and getting on pants safely without falling   Review of Systems     Objective:   Physical Exam        Assessment & Plan:

## 2015-06-02 NOTE — Telephone Encounter (Signed)
Please let her know I made sure to addend her office note to include this  Please ask if office note needs to be sent somewhere currently Thanks

## 2015-07-12 ENCOUNTER — Telehealth: Payer: Self-pay | Admitting: Family Medicine

## 2015-07-12 NOTE — Telephone Encounter (Signed)
Pt has appt 07/13/15 at 10:30 with Pamala Hurry Baity NP

## 2015-07-12 NOTE — Telephone Encounter (Signed)
TELEPHONE ADVICE RECORD [facility]  Patient Name: [name] SpragueVIRGINIA [name]  DOB: 06/23/1923    Initial Comment caller states her mother's right ankle has been swollen    Nurse Assessment  Nurse: Odis LusterBowers, RN, Bjorn Loserhonda Date/Time Lamount Cohen(Eastern Time): 07/12/2015 4:40:18 PM  Confirm and document reason for call. If symptomatic, describe symptoms. ---Caller states her mother's right ankle has been swollen. Reports swelling has been present for greater than a week. Reports she has been elevating with some assistance. Denies pain or hot. Swelling noted today around the ankle. Reports patient has been more active.  Has the patient traveled out of the country within the last 30 days? ---Not Applicable  Does the patient have any new or worsening symptoms? ---Yes  Will a triage be completed? ---Yes  Related visit to physician within the last 2 weeks? ---No  Does the PT have any chronic conditions? (i.e. diabetes, asthma, etc.) ---Yes  List chronic conditions. ---hypertension, depression  Is this a behavioral health call? ---No     Guidelines    Guideline Title Affirmed Question Affirmed Notes  Leg Swelling and Edema [1] MODERATE leg swelling (e.g., swelling extends up to knees) AND [2] new onset or worsening    Final Disposition User   See Physician within 24 Hours MendonBowers, RN, Bjorn LoserRhonda    Comments  caller disconnected before I could get the name of the dr her mother sees  [facility]/15 @10 :30a with Nicki Reaperegina Baity.   Referrals  REFERRED TO PCP OFFICE   Disagree/Comply: Comply

## 2015-07-13 ENCOUNTER — Encounter: Payer: Self-pay | Admitting: Internal Medicine

## 2015-07-13 ENCOUNTER — Ambulatory Visit (INDEPENDENT_AMBULATORY_CARE_PROVIDER_SITE_OTHER): Payer: Commercial Managed Care - HMO | Admitting: Internal Medicine

## 2015-07-13 VITALS — BP 126/88 | HR 79 | Temp 98.0°F | Wt 130.5 lb

## 2015-07-13 DIAGNOSIS — Z23 Encounter for immunization: Secondary | ICD-10-CM

## 2015-07-13 DIAGNOSIS — R600 Localized edema: Secondary | ICD-10-CM | POA: Diagnosis not present

## 2015-07-13 LAB — BASIC METABOLIC PANEL
BUN: 23 mg/dL (ref 6–23)
CHLORIDE: 99 meq/L (ref 96–112)
CO2: 36 mEq/L — ABNORMAL HIGH (ref 19–32)
Calcium: 9.3 mg/dL (ref 8.4–10.5)
Creatinine, Ser: 1.1 mg/dL (ref 0.40–1.20)
GFR: 49.6 mL/min — ABNORMAL LOW (ref >60.00)
Glucose, Bld: 91 mg/dL (ref 70–99)
POTASSIUM: 4.3 meq/L (ref 3.5–5.1)
SODIUM: 141 meq/L (ref 135–145)

## 2015-07-13 NOTE — Progress Notes (Signed)
Subjective:    Patient ID: [name] [name], female    DOB: 1923-02-19, 81 y.o.   MRN: [id number]  HPI  Pt presents to the clinic today with c/o right ankle swelling. This started 2 weeks ago. It has been intermittent since that time. The swelling is not painful. She denies redness, warmth, numbness or tingling. She denies any injury to the area. She denies any shortness of breath. Her daughter reports she has been more active lately. She has been taking the HCTZ as directed. She has elevated her leg with some reduction in the swelling.  Review of Systems      Past Medical History  Diagnosis Date  . Allergic rhinitis   . Anxiety   . Depression   . GERD (gastroesophageal reflux disease)   . Hypertension   . Osteopenia   . Bradycardia   . Hyperlipidemia   . Duodenal ulcer   . Other specified cardiac dysrhythmias(427.89)   . Diverticulosis   . GI bleed   . Atrial fibrillation (HCC)   . Anemia associated with acute blood loss   . Diverticulosis   . Shortness of breath dyspnea     with exertion  . Presence of permanent cardiac pacemaker     Current Outpatient Prescriptions  Medication Sig Dispense Refill  . aspirin 81 MG tablet Take 81 mg by mouth daily.    . Cholecalciferol (VITAMIN D3) 2000 UNITS TABS Take 1 capsule by mouth daily.    . CVS SENNA 8.6 MG tablet TAKE 2 TABLETS BY MOUTH TWICE A DAY AT 8AM AND ALSP AT 5PM 120 tablet 11  . donepezil (ARICEPT) 10 MG tablet Take 1 tablet by mouth daily.    . hydrochlorothiazide (HYDRODIURIL) 25 MG tablet TAKE 1 TABLET BY MOUTH ONCE A DAY 30 tablet 11  . memantine (NAMENDA) 5 MG tablet Take 5 mg by mouth 2 (two) times daily.  3  . metoprolol tartrate (LOPRESSOR) 25 MG tablet Take 1 tablet (25 mg total) by mouth 2 (two) times daily. 180 tablet 3  . mirtazapine (REMERON) 7.5 MG tablet Take 1 tablet (7.5 mg total) by mouth at bedtime. 30 tablet 11  . Multiple Vitamin (MULTIVITAMIN) tablet Take 1 tablet by mouth daily.      Marland Kitchen  omeprazole (PRILOSEC) 20 MG capsule TAKE 1 CAPSULE EVERY DAY 90 capsule 3  . sertraline (ZOLOFT) 100 MG tablet Take 1 tablet (100 mg total) by mouth daily. 90 tablet 3  . SUPER B COMPLEX/C PO Take 1 capsule by mouth daily.     No current facility-administered medications for this visit.    Allergies  Allergen Reactions  . Ace Inhibitors     REACTION: cough  . Alendronate Sodium     REACTION: aches  . Avapro [Irbesartan]     hypotension  . Codeine     Dizzy and nauseated   . Dabigatran Etexilate Mesylate Diarrhea    indigestion  . Nitrofurantoin     REACTION: rash  . Penicillins     REACTION: rash  . Prednisone Other (See Comments)    Extreme weakness  . Trazodone And Nefazodone Other (See Comments)    Nightmares     Family History  Problem Relation Age of Onset  . Heart attack Father   . Depression Mother   . Alzheimer's disease Mother   . Pneumonia Mother   . Heart disease Brother     hx of CABG    Social History   Social History  .  Marital Status: Widowed    Spouse Name: N/A  . Number of Children: N/A  . Years of Education: N/A   Occupational History  . Not on file.   Social History Main Topics  . Smoking status: Never Smoker   . Smokeless tobacco: Never Used  . Alcohol Use: No     Comment: infrequently-rare  . Drug Use: No  . Sexual Activity: Not on file   Other Topics Concern  . Not on file   Social History Narrative     Constitutional: Denies fever, malaise, fatigue, headache or abrupt weight changes.  Respiratory: Denies difficulty breathing, shortness of breath, cough or sputum production.   Cardiovascular: Denies chest pain, chest tightness, palpitations or swelling in the hands or feet.  Musculoskeletal: Pt reports right ankle swelling. Denies decrease in range of motion, difficulty with gait, muscle pain or joint pain.  Skin: Denies redness, rashes, lesions or ulcercations.    No other specific complaints in a complete review of  systems (except as listed in HPI above).  Objective:   Physical Exam  BP 126/88 mmHg  Pulse 79  Temp(Src) 98 F (36.7 C) (Oral)  Wt 130 lb 8 oz (59.194 kg)  SpO2 96% Wt Readings from Last 3 Encounters:  07/13/15 130 lb 8 oz (59.194 kg)  05/30/15 122 lb 8 oz (55.566 kg)  03/15/15 126 lb (57.153 kg)    General: Appears her stated age,  in NAD. Skin: Warm, dry and intact. Toes are dusky. Cardiovascular: Irregular today. Cap refill 4-5 secs in LE. Pedal pulses 2+ bilaterally. 1+ pitting pretibial edema noted on the right. Pulmonary/Chest: Normal effort and positive vesicular breath sounds. No respiratory distress. No wheezes, rales or ronchi noted.  Musculoskeletal: Normal flexion, extension and rotation of the right ankle. No pain with palpation. 2+ swelling noted. No difficulty with gait.  Neurological: Alert and oriented. Sensation intact to BLE.   BMET    Component Value Date/Time   NA 139 03/15/2015 1431   K 4.0 03/15/2015 1431   K 4.4 01/25/2014 1138   CL 99 03/15/2015 1431   CO2 36* 03/15/2015 1431   GLUCOSE 90 03/15/2015 1431   BUN 27* 03/15/2015 1431   CREATININE 1.11 03/15/2015 1431   CALCIUM 9.7 03/15/2015 1431   GFRNONAA 52* 09/27/2014 1023   GFRAA 60* 09/27/2014 1023    Lipid Panel     Component Value Date/Time   CHOL 237* 11/30/2014 1225   TRIG 113.0 11/30/2014 1225   HDL 54.60 11/30/2014 1225   CHOLHDL 4 11/30/2014 1225   VLDL 22.6 11/30/2014 1225   LDLCALC 160* 11/30/2014 1225    CBC    Component Value Date/Time   WBC 7.1 03/15/2015 1431   RBC 4.51 03/15/2015 1431   HGB 13.7 03/15/2015 1431   HCT 41.4 03/15/2015 1431   PLT 327.0 03/15/2015 1431   MCV 91.9 03/15/2015 1431   MCH 31.0 09/27/2014 1023   MCHC 33.1 03/15/2015 1431   RDW 16.3* 03/15/2015 1431   LYMPHSABS 2.4 03/15/2015 1431   MONOABS 0.7 03/15/2015 1431   EOSABS 0.1 03/15/2015 1431   BASOSABS 0.1 03/15/2015 1431    Hgb A1C No results found for: HGBA1C       Assessment &  Plan:   Swelling of the right lower extremity:  No concern for DVT, despite history of AFIB Likely dependant Encouraged her to continue elevation Continue HCTZ RX for TED hose to wear during the day, off at night Will check BMET today  RTC  as needed or if symptoms persist or worsen

## 2015-07-13 NOTE — Patient Instructions (Signed)
Edema °Edema is an abnormal buildup of fluids in your body tissues. Edema is somewhat dependent on gravity to pull the fluid to the lowest place in your body. That makes the condition more common in the legs and thighs (lower extremities). Painless swelling of the feet and ankles is common and becomes more likely as you get older. It is also common in looser tissues, like around your eyes.  °When the affected area is squeezed, the fluid may move out of that spot and leave a dent for a few moments. This dent is called pitting.  °CAUSES  °There are many possible causes of edema. Eating too much salt and being on your feet or sitting for a long time can cause edema in your legs and ankles. Hot weather may make edema worse. Common medical causes of edema include: °· Heart failure. °· Liver disease. °· Kidney disease. °· Weak blood vessels in your legs. °· Cancer. °· An injury. °· Pregnancy. °· Some medications. °· Obesity.  °SYMPTOMS  °Edema is usually painless. Your skin may look swollen or shiny.  °DIAGNOSIS  °Your health care provider may be able to diagnose edema by asking about your medical history and doing a physical exam. You may need to have tests such as X-rays, an electrocardiogram, or blood tests to check for medical conditions that may cause edema.  °TREATMENT  °Edema treatment depends on the cause. If you have heart, liver, or kidney disease, you need the treatment appropriate for these conditions. General treatment may include: °· Elevation of the affected body part above the level of your heart. °· Compression of the affected body part. Pressure from elastic bandages or support stockings squeezes the tissues and forces fluid back into the blood vessels. This keeps fluid from entering the tissues. °· Restriction of fluid and salt intake. °· Use of a water pill (diuretic). These medications are appropriate only for some types of edema. They pull fluid out of your body and make you urinate more often. This  gets rid of fluid and reduces swelling, but diuretics can have side effects. Only use diuretics as directed by your health care provider. °HOME CARE INSTRUCTIONS  °· Keep the affected body part above the level of your heart when you are lying down.   °· Do not sit still or stand for prolonged periods.   °· Do not put anything directly under your knees when lying down. °· Do not wear constricting clothing or garters on your upper legs.   °· Exercise your legs to work the fluid back into your blood vessels. This may help the swelling go down.   °· Wear elastic bandages or support stockings to reduce ankle swelling as directed by your health care provider.   °· Eat a low-salt diet to reduce fluid if your health care provider recommends it.   °· Only take medicines as directed by your health care provider.  °SEEK MEDICAL CARE IF:  °· Your edema is not responding to treatment. °· You have heart, liver, or kidney disease and notice symptoms of edema. °· You have edema in your legs that does not improve after elevating them.   °· You have sudden and unexplained weight gain. °SEEK IMMEDIATE MEDICAL CARE IF:  °· You develop shortness of breath or chest pain.   °· You cannot breathe when you lie down. °· You develop pain, redness, or warmth in the swollen areas.   °· You have heart, liver, or kidney disease and suddenly get edema. °· You have a fever and your symptoms suddenly get worse. °MAKE SURE YOU:  °·   Understand these instructions. °· Will watch your condition. °· Will get help right away if you are not doing well or get worse. °  °This information is not intended to replace advice given to you by your health care provider. Make sure you discuss any questions you have with your health care provider. °  °Document Released: 08/12/2005 Document Revised: 09/02/2014 Document Reviewed: 06/04/2013 °Elsevier Interactive Patient Education ©2016 [employer] ° °

## 2015-07-13 NOTE — Progress Notes (Signed)
Pre visit review using our clinic review tool, if applicable. No additional management support is needed unless otherwise documented below in the visit note. 

## 2015-07-29 ENCOUNTER — Ambulatory Visit (INDEPENDENT_AMBULATORY_CARE_PROVIDER_SITE_OTHER): Payer: Commercial Managed Care - HMO | Admitting: Family Medicine

## 2015-07-29 ENCOUNTER — Encounter: Payer: Self-pay | Admitting: Family Medicine

## 2015-07-29 VITALS — BP 134/82 | HR 95 | Temp 97.9°F | Resp 16 | Wt 137.8 lb

## 2015-07-29 DIAGNOSIS — R609 Edema, unspecified: Secondary | ICD-10-CM | POA: Insufficient documentation

## 2015-07-29 DIAGNOSIS — R600 Localized edema: Secondary | ICD-10-CM

## 2015-07-29 NOTE — Assessment & Plan Note (Signed)
New to provider, ongoing for pt.  Edema is localized w/o evidence of systemic volume overload- no SOB above baseline, normal breath sounds on PE.  Suspect this is due to pt's high salt diet, limited water intake, relative inactivity, and not wearing her compression socks.  Pt may need to switch from HCTZ to lasix for symptom relief but will defer this decision to PCP as it would require f/u labs.  Reviewed supportive care and red flags that should prompt return.  Pt expressed understanding and is in agreement w/ plan.

## 2015-07-29 NOTE — Progress Notes (Signed)
   Subjective:    Patient ID: [name] [name], female    DOB: 09/03/1922, 81 y.o.   MRN: [id number]  HPI Edema- pt saw Nicki ReaperRegina Baity on 11/17 for similar.  Pt reports that prior to a few days ago swelling was confined to R lower leg but now sxs are bilateral.  No pain.  Mild increase in SOB w/ exertion.  Pt reports eating soup, lunch meats, processed bacon, sausage.  Drinking 'a couple of pints a day' of water.  No CP.  No swelling of hands.  Pt has not been wearing compression hose for the last week.  Pt reports she is trying to elevate legs when sitting.   Review of Systems For ROS see HPI     Objective:   Physical Exam  Constitutional: She is oriented to person, place, and time. She appears well-developed and well-nourished. No distress.  HENT:  Head: Normocephalic and atraumatic.  Eyes: Conjunctivae and EOM are normal. Pupils are equal, round, and reactive to light.  Neck: Normal range of motion. Neck supple.  Cardiovascular:  Irregularly irregular S1/S2  Pulmonary/Chest: Effort normal and breath sounds normal. No respiratory distress. She has no wheezes. She has no rales.  Musculoskeletal: She exhibits edema (1+ edema of LEs bilaterally).  Lymphadenopathy:    She has no cervical adenopathy.  Neurological: She is alert and oriented to person, place, and time.  Skin: Skin is warm and dry. No rash noted. No erythema.  Psychiatric: She has a normal mood and affect. Her behavior is normal.  Vitals reviewed.         Assessment & Plan:

## 2015-07-29 NOTE — Patient Instructions (Signed)
Follow up with your primary doctor in 1-2 weeks Try and limit your salt intake, increase your water intake, elevate your legs, and wear your compression socks Call with any questions or concerns If you have chest pain, shortness of breath, worsening swelling, etc please go to the ER Hang in there!!  Low-Sodium Eating Plan Sodium raises blood pressure and causes water to be held in the body. Getting less sodium from food will help lower your blood pressure, reduce any swelling, and protect your heart, liver, and kidneys. We get sodium by adding salt (sodium chloride) to food. Most of our sodium comes from canned, boxed, and frozen foods. Restaurant foods, fast foods, and pizza are also very high in sodium. Even if you take medicine to lower your blood pressure or to reduce fluid in your body, getting less sodium from your food is important. WHAT IS MY PLAN? Most people should limit their sodium intake to 2,300 mg a day. Your health care provider recommends that you limit your sodium intake to __________ a day.  WHAT DO I NEED TO KNOW ABOUT THIS EATING PLAN? For the low-sodium eating plan, you will follow these general guidelines:  Choose foods with a % Daily Value for sodium of less than 5% (as listed on the food label).   Use salt-free seasonings or herbs instead of table salt or sea salt.   Check with your health care provider or pharmacist before using salt substitutes.   Eat fresh foods.  Eat more vegetables and fruits.  Limit canned vegetables. If you do use them, rinse them well to decrease the sodium.   Limit cheese to 1 oz (28 g) per day.   Eat lower-sodium products, often labeled as "lower sodium" or "no salt added."  Avoid foods that contain monosodium glutamate (MSG). MSG is sometimes added to [place] food and some canned foods.  Check food labels (Nutrition Facts labels) on foods to learn how much sodium is in one serving.  Eat more home-cooked food and less  restaurant, buffet, and fast food.  When eating at a restaurant, ask that your food be prepared with less salt, or no salt if possible.  HOW DO I READ FOOD LABELS FOR SODIUM INFORMATION? The Nutrition Facts label lists the amount of sodium in one serving of the food. If you eat more than one serving, you must multiply the listed amount of sodium by the number of servings. Food labels may also identify foods as:  Sodium free--Less than 5 mg in a serving.  Very low sodium--35 mg or less in a serving.  Low sodium--140 mg or less in a serving.  Light in sodium--50% less sodium in a serving. For example, if a food that usually has 300 mg of sodium is changed to become light in sodium, it will have 150 mg of sodium.  Reduced sodium--25% less sodium in a serving. For example, if a food that usually has 400 mg of sodium is changed to reduced sodium, it will have 300 mg of sodium. WHAT FOODS CAN I EAT? Grains Low-sodium cereals, including oats, puffed wheat and rice, and shredded wheat cereals. Low-sodium crackers. Unsalted rice and pasta. Lower-sodium bread.  Vegetables Frozen or fresh vegetables. Low-sodium or reduced-sodium canned vegetables. Low-sodium or reduced-sodium tomato sauce and paste. Low-sodium or reduced-sodium tomato and vegetable juices.  Fruits Fresh, frozen, and canned fruit. Fruit juice.  Meat and Other Protein Products Low-sodium canned tuna and salmon. Fresh or frozen meat, poultry, seafood, and fish. Lamb. Unsalted nuts.  Dried beans, peas, and lentils without added salt. Unsalted canned beans. Homemade soups without salt. Eggs.  Dairy Milk. Soy milk. Ricotta cheese. Low-sodium or reduced-sodium cheeses. Yogurt.  Condiments Fresh and dried herbs and spices. Salt-free seasonings. Onion and garlic powders. Low-sodium varieties of mustard and ketchup. Fresh or refrigerated horseradish. Lemon juice.  Fats and Oils Reduced-sodium salad dressings. Unsalted butter.   Other Unsalted popcorn and pretzels.  The items listed above may not be a complete list of recommended foods or beverages. Contact your dietitian for more options. WHAT FOODS ARE NOT RECOMMENDED? Grains Instant hot cereals. Bread stuffing, pancake, and biscuit mixes. Croutons. Seasoned rice or pasta mixes. Noodle soup cups. Boxed or frozen macaroni and cheese. Self-rising flour. Regular salted crackers. Vegetables Regular canned vegetables. Regular canned tomato sauce and paste. Regular tomato and vegetable juices. Frozen vegetables in sauces. Salted [place] fries. Olives. Rosita FirePickles. Relishes. Sauerkraut. Salsa. Meat and Other Protein Products Salted, canned, smoked, spiced, or pickled meats, seafood, or fish. Bacon, ham, sausage, hot dogs, corned beef, chipped beef, and packaged luncheon meats. Salt pork. Jerky. Pickled herring. Anchovies, regular canned tuna, and sardines. Salted nuts. Dairy Processed cheese and cheese spreads. Cheese curds. Blue cheese and cottage cheese. Buttermilk.  Condiments Onion and garlic salt, seasoned salt, table salt, and sea salt. Canned and packaged gravies. Worcestershire sauce. Tartar sauce. Barbecue sauce. Teriyaki sauce. Soy sauce, including reduced sodium. Steak sauce. Fish sauce. Oyster sauce. Cocktail sauce. Horseradish that you find on the shelf. Regular ketchup and mustard. Meat flavorings and tenderizers. Bouillon cubes. Hot sauce. Tabasco sauce. Marinades. Taco seasonings. Relishes. Fats and Oils Regular salad dressings. Salted butter. Margarine. Ghee. Bacon fat.  Other Potato and tortilla chips. Corn chips and puffs. Salted popcorn and pretzels. Canned or dried soups. Pizza. Frozen entrees and pot pies.  The items listed above may not be a complete list of foods and beverages to avoid. Contact your dietitian for more information.   This information is not intended to replace advice given to you by your health care provider. Make sure you  discuss any questions you have with your health care provider.   Document Released: 02/01/2002 Document Revised: 09/02/2014 Document Reviewed: 06/16/2013 Elsevier Interactive Patient Education [employer]2016 Elsevier Inc.

## 2015-07-29 NOTE — Progress Notes (Signed)
Pre visit review using our clinic review tool, if applicable. No additional management support is needed unless otherwise documented below in the visit note. 

## 2015-07-31 ENCOUNTER — Telehealth: Payer: Self-pay

## 2015-07-31 NOTE — Telephone Encounter (Signed)
PLEASE NOTE: All timestamps contained within this report are represented as [place] Standard Time. CONFIDENTIALTY NOTICE: This fax transmission is intended only for the addressee. It contains information that is legally privileged, confidential or otherwise protected from use or disclosure. If you are not the intended recipient, you are strictly prohibited from reviewing, disclosing, copying using or disseminating any of this information or taking any action in reliance on or regarding this information. If you have received this fax in error, please notify us immediately by telephone so that we can arrange for its return to us. Phone: [phone], Toll-Free: [phone], Fax: [phone] Page: 1 of 2 Call Id: [id number] [facility] TELEPHONE ADVICE RECORD [facility] Patient Name: [name] SpragueVIRGINIA Fronek Gender: Female DOB: 05/06/1923 Age: [age] Y 2 D Return Phone Number: [phone] (Primary) Address: City/State/Zip: [place] Client [facility] [place] Phys[place], [place] Contact Type Call Call Type Triage / Clinical Relationship To Patient Self Return Phone Number [phone] (Primary) Chief Complaint Feet swelling Initial Comment Caller states she has ankle swelling PreDisposition Go to Urgent Care/Walk-In Clinic Nurse Assessment Nurse: Nicanor Bakeosta, RN, Marylene LandAngela Date/Time (Eastern Time): 07/29/2015 9:59:23 AM Confirm and document reason for call. If symptomatic, describe symptoms. ---Dtr states she saw the PA a couple of weeks for right ankle swelling. She states now both ankles are swollen with the swelling on the right going almost to the knee. Has the patient traveled out of the country within the last 30 days? ---No Does the patient have any new or worsening symptoms? ---Yes Will a triage be completed? ---Yes Related visit to physician within the last  2 weeks? ---Yes Does the PT have any chronic conditions? (i.e. diabetes, asthma, etc.) ---Yes List chronic conditions. ---pacemaker (put in 9 yrs ago for irregular heart rate), HTN, Is this a behavioral health or substance abuse call? ---No Guidelines Guideline Title Affirmed Question Affirmed Notes Nurse Date/Time Lamount Cohen(Eastern Time) Leg Swelling and Edema [1] MODERATE leg swelling (e.g., swelling extends up to knees) AND [2] new onset or worsening [place], RN, Marylene Landngela 07/29/2015 10:02:22 AM Disp. Time Lamount Cohen(Eastern Time) Disposition Final User 07/29/2015 9:52:25 AM Send To RN Personal Carleene OverlieMasterson, RN, Debra 07/29/2015 10:06:41 AM See Physician within 24 Hours Yes Nicanor Bakeosta, RN, Rosalyn ChartersAngela Caller Understands: Yes PLEASE NOTE: All timestamps contained within this report are represented as [place] Standard Time. CONFIDENTIALTY NOTICE: This fax transmission is intended only for the addressee. It contains information that is legally privileged, confidential or otherwise protected from use or disclosure. If you are not the intended recipient, you are strictly prohibited from reviewing, disclosing, copying using or disseminating any of this information or taking any action in reliance on or regarding this information. If you have received this fax in error, please notify us immediately by telephone so that we can arrange for its return to us. Phone: [phone], Toll-Free: [phone], Fax: [phone] Page: 2 of 2 Call Id: [id number] Disagree/Comply: Comply Care Advice Given Per Guideline SEE PHYSICIAN WITHIN 24 HOURS: LEG SWELLING AND/OR EDEMA: * Elevate your legs or try to lay down once or twice daily for 20 minutes. * Avoid socks with an elastic band at the top. Wear comfortable shoes. CALL BACK IF: * Breathing difficulty or chest pain occurs * You become worse. CARE ADVICE given per Leg Swelling and Edema (Adult) guideline. After Care Instructions Given Call Event Type User Date / Time  Description Referrals [facility]

## 2015-07-31 NOTE — Telephone Encounter (Signed)
Aware-will see her in f/u

## 2015-07-31 NOTE — Telephone Encounter (Signed)
[facility] chart review [facility] and has f/u appt scheduled with Dr Milinda Antisower on 08/08/15 at 10:15.

## 2015-07-31 NOTE — Telephone Encounter (Signed)
PLEASE NOTE: All timestamps contained within this report are represented as [place] Standard Time. CONFIDENTIALTY NOTICE: This fax transmission is intended only for the addressee. It contains information that is legally privileged, confidential or otherwise protected from use or disclosure. If you are not the intended recipient, you are strictly prohibited from reviewing, disclosing, copying using or disseminating any of this information or taking any action in reliance on or regarding this information. If you have received this fax in error, please notify us immediately by telephone so that we can arrange for its return to us. Phone: [phone], Toll-Free: [phone], Fax: [phone] Page: 1 of 1 Call Id: [id number] [facility] Client TELEPHONE ADVICE RECORD [facility] Patient Name: [name] SpragueVIRGINIA Picco Gender: Female DOB: 08/04/1923 Age: [age] Y 2 D Return Phone Number: [phone] (Primary) Address: [address] [place]State[place]Zip: [place] [address] Client [facility] [place] Contact Type Call Call Type Triage / Clinical Relationship To Patient Self Appointment Disposition EMR Appointment Not Necessary Info pasted into Epic No Return Phone Number [phone] (Primary) Chief Complaint Leg Pain Initial Comment Caller says her ankles are really swollen Nurse Assessment Guidelines Guideline Title Affirmed Question Affirmed Notes Nurse Date/Time (Eastern Time) Disp. Time Lamount Cohen(Eastern Time) Disposition Final User 07/29/2015 9:53:23 AM Send To RN Personal Carleene OverlieMasterson, RN, Debra 07/29/2015 10:09:26 AM Call Completed Nicanor Bakeosta, RN, Marylene LandAngela 07/29/2015 10:09:15 AM Clinical Call Yes Nicanor Bakeosta, RN, Marylene LandAngela After Care Instructions Given Call Event Type User Date / Time Description Comments User: Unk LightningAngela, Costa, RN Date/Time Lamount Cohen(Eastern Time): 07/29/2015 10:08:31  AM See trid - [id number] for triage

## 2015-08-08 ENCOUNTER — Encounter: Payer: Self-pay | Admitting: Family Medicine

## 2015-08-08 ENCOUNTER — Ambulatory Visit (INDEPENDENT_AMBULATORY_CARE_PROVIDER_SITE_OTHER): Payer: Commercial Managed Care - HMO | Admitting: Family Medicine

## 2015-08-08 VITALS — BP 132/86 | HR 93 | Temp 97.8°F | Ht 62.0 in | Wt 140.0 lb

## 2015-08-08 DIAGNOSIS — R600 Localized edema: Secondary | ICD-10-CM

## 2015-08-08 DIAGNOSIS — R0600 Dyspnea, unspecified: Secondary | ICD-10-CM | POA: Insufficient documentation

## 2015-08-08 DIAGNOSIS — I10 Essential (primary) hypertension: Secondary | ICD-10-CM | POA: Diagnosis not present

## 2015-08-08 LAB — BRAIN NATRIURETIC PEPTIDE: PRO B NATRI PEPTIDE: 946 pg/mL — AB (ref 0.0–100.0)

## 2015-08-08 NOTE — Progress Notes (Signed)
Pre visit review using our clinic review tool, if applicable. No additional management support is needed unless otherwise documented below in the visit note. 

## 2015-08-08 NOTE — Progress Notes (Signed)
Subjective:    Patient ID: [name] DingwallVirginia Y [name], female    DOB: 09/19/1922, 81 y.o.   MRN: [id number]  HPI Here for f/u of leg swelling    F/u of leg swelling  Family is unsure what she is eating -may still have a lot of processed foods in diet  Knows to drink more water and she forgets (dementia) Not enough help at home- in the process of getting long term care  Cannot get the support stockings on     Chemistry      Component Value Date/Time   NA 141 07/13/2015 1106   K 4.3 07/13/2015 1106   K 4.4 01/25/2014 1138   CL 99 07/13/2015 1106   CO2 36* 07/13/2015 1106   BUN 23 07/13/2015 1106   CREATININE 1.10 07/13/2015 1106      Component Value Date/Time   CALCIUM 9.3 07/13/2015 1106   ALKPHOS 108 03/15/2015 1431   AST 36 03/15/2015 1431   ALT 21 03/15/2015 1431   BILITOT 0.3 03/15/2015 1431      Swelling does not bother her a lot /but wonders why it happens   Very nervous - most of the time  Out of breath on exertion more lately - just walking short distances  Per family- could be deconditioned- she moves less and sits more than she used to  Does not have a recliner to elevate feet -looking for one   In search of long term care in light of pt's worsening memory - for safety -and she is aware of this It frustrates her   Has f/u cardiol feb  Hx of a fib   HTN is in good control bp is stable today  No cp or palpitations or headaches or edema  No side effects to medicines  BP Readings from Last 3 Encounters:  08/08/15 132/86  07/29/15 134/82  07/13/15 126/88       Patient Active Problem List   Diagnosis Date Noted  . Dyspnea 08/08/2015  . Edema 07/29/2015  . Dementia 05/30/2015  . DJD (degenerative joint disease) 10/11/2014  . Memory loss 10/05/2014  . Fatigue 10/05/2014  . B12 deficiency 10/05/2014  . Preoperative examination 09/13/2014  . Insomnia 08/02/2014  . Hot flashes 08/02/2014  . Malaise and fatigue 04/06/2014  . Rectal bleeding 03/24/2014    . Personal history of arteriovenous malformation (AVM) 03/24/2014  . Lesion of throat 01/18/2014  . Left hip pain 01/10/2014  . Hip osteoarthritis 01/10/2014  . Osteopenia 11/11/2013  . Encounter for Medicare annual wellness exam 11/10/2013  . Hand pain, left 11/10/2013  . Fall due to stumbling 11/01/2013  . Contusion 11/01/2013  . Pacemaker -Medtronic 07/20/2013  . Angiodysplasia of colon with hemorrhage 06/25/2013  . Atrial fibrillation (HCC) 06/06/2011  . Anxiety state 06/16/2007  . Adjustment disorder with mixed anxiety and depressed mood 06/16/2007  . Essential hypertension 06/16/2007  . GERD 06/16/2007   Past Medical History  Diagnosis Date  . Allergic rhinitis   . Anxiety   . Depression   . GERD (gastroesophageal reflux disease)   . Hypertension   . Osteopenia   . Bradycardia   . Hyperlipidemia   . Duodenal ulcer   . Other specified cardiac dysrhythmias(427.89)   . Diverticulosis   . GI bleed   . Atrial fibrillation (HCC)   . Anemia associated with acute blood loss   . Diverticulosis   . Shortness of breath dyspnea     with exertion  . Presence of permanent  cardiac pacemaker    Past Surgical History  Procedure Laterality Date  . Appendectomy    . Total abdominal hysterectomy    . Tonsillectomy    . Ankle fracture surgery Right 05/2002  . Insert / replace / remove pacemaker    . Cardiac defibrillator placement    . Esophagogastroduodenoscopy N/A 06/24/2013    Procedure: ESOPHAGOGASTRODUODENOSCOPY (EGD);  Surgeon: Hilarie Fredrickson, MD;  Location: [facility];  Service: Endoscopy;  Laterality: N/A;  . Colonoscopy N/A 06/25/2013    Procedure: COLONOSCOPY;  Surgeon: Hilarie Fredrickson, MD;  Location: [facility];  Service: Endoscopy;  Laterality: N/A;  . Total hip arthroplasty Left 10/11/2014    DR MURPHY  . Total hip arthroplasty Left 10/11/2014    Procedure: TOTAL HIP ARTHROPLASTY ANTERIOR APPROACH;  Surgeon: Sheral Apley, MD;  Location: [facility] OR;  Service:  Orthopedics;  Laterality: Left;   Social History  Substance Use Topics  . Smoking status: Never Smoker   . Smokeless tobacco: Never Used  . Alcohol Use: No     Comment: infrequently-rare   Family History  Problem Relation Age of Onset  . Heart attack Father   . Depression Mother   . Alzheimer's disease Mother   . Pneumonia Mother   . Heart disease Brother     hx of CABG   Allergies  Allergen Reactions  . Ace Inhibitors     REACTION: cough  . Alendronate Sodium     REACTION: aches  . Avapro [Irbesartan]     hypotension  . Codeine     Dizzy and nauseated   . Dabigatran Etexilate Mesylate Diarrhea    indigestion  . Nitrofurantoin     REACTION: rash  . Penicillins     REACTION: rash  . Prednisone Other (See Comments)    Extreme weakness  . Trazodone And Nefazodone Other (See Comments)    Nightmares    Current Outpatient Prescriptions on File Prior to Visit  Medication Sig Dispense Refill  . aspirin 81 MG tablet Take 81 mg by mouth daily.    . Cholecalciferol (VITAMIN D3) 2000 UNITS TABS Take 1 capsule by mouth daily.    . [employer] SENNA 8.6 MG tablet TAKE 2 TABLETS BY MOUTH TWICE A DAY AT 8AM AND ALSP AT 5PM 120 tablet 11  . donepezil (ARICEPT) 10 MG tablet Take 1 tablet by mouth daily.    . hydrochlorothiazide (HYDRODIURIL) 25 MG tablet TAKE 1 TABLET BY MOUTH ONCE A DAY 30 tablet 11  . memantine (NAMENDA) 5 MG tablet Take 5 mg by mouth 2 (two) times daily.  3  . metoprolol tartrate (LOPRESSOR) 25 MG tablet Take 1 tablet (25 mg total) by mouth 2 (two) times daily. 180 tablet 3  . mirtazapine (REMERON) 7.5 MG tablet Take 1 tablet (7.5 mg total) by mouth at bedtime. 30 tablet 11  . Multiple Vitamin (MULTIVITAMIN) tablet Take 1 tablet by mouth daily.      Marland Kitchen omeprazole (PRILOSEC) 20 MG capsule TAKE 1 CAPSULE EVERY DAY 90 capsule 3  . sertraline (ZOLOFT) 100 MG tablet Take 1 tablet (100 mg total) by mouth daily. 90 tablet 3  . SUPER B COMPLEX/C PO Take 1 capsule by mouth daily.      No current facility-administered medications on file prior to visit.    Review of Systems Review of Systems  Constitutional: Negative for fever, appetite change, fatigue and unexpected weight change.  Eyes: Negative for pain and visual disturbance.  Respiratory: Negative for cough and pos  for sob on exertion  Cardiovascular: Negative for cp or palpitations   pos for bilateral pedal edema  Gastrointestinal: Negative for nausea, diarrhea and constipation.  Genitourinary: Negative for urgency and frequency.  Skin: Negative for pallor or rash   Neurological: Negative for weakness, light-headedness, numbness and headaches.  Hematological: Negative for adenopathy. Does not bruise/bleed easily.  Psychiatric/Behavioral: Negative for dysphoric mood. The patient is anxious , pos for short term memory loss         Objective:   Physical Exam  Constitutional: She appears well-developed and well-nourished. No distress.  Frail appearing elderly female -in good spirits   HENT:  Head: Normocephalic and atraumatic.  Mouth/Throat: Oropharynx is clear and moist.  Eyes: Conjunctivae and EOM are normal. Pupils are equal, round, and reactive to light.  Neck: Normal range of motion. Neck supple. No JVD present. Carotid bruit is not present. No thyromegaly present.  Cardiovascular: Normal rate, normal heart sounds and intact distal pulses.  Exam reveals no gallop.   Rate of 89 while resting   Pulmonary/Chest: Effort normal and breath sounds normal. No respiratory distress. She has no wheezes. She has no rales.  No crackles  Abdominal: Soft. Bowel sounds are normal. She exhibits no distension, no abdominal bruit and no mass. There is no tenderness.  Musculoskeletal: She exhibits edema.  One plus pitting edema of feet/ankles - 1/2 way to the knee No tenderness No palpable cords/erythema or warmth   Lymphadenopathy:    She has no cervical adenopathy.  Neurological: She is alert. She has normal  reflexes.  Skin: Skin is warm and dry. No rash noted.  Psychiatric: She has a normal mood and affect.  Pos for short term memory loss  Pleasant - answers questions appropriately but does repeat herself           Assessment & Plan:   Problem List Items Addressed This Visit      Cardiovascular and Mediastinum   Essential hypertension - Primary    bp in fair control at this time  BP Readings from Last 1 Encounters:  08/08/15 132/86   No changes needed Disc lifstyle change with low sodium diet and exercise  Reviewed last labs         Other   Dyspnea    Unsure if related to pedal edema (check BNP today) or if it is from deconditioning  Not severe  No crackles on pulm exam        Relevant Orders   Brain natriuretic peptide (Completed)   Edema    LE  Suspect multifactorial  (? Venous insuff) Dementia- looking for long term care Unable to remember to eat less sodium or drink more water No help to get supp hose on either  In light of sob- check BNP Has cardiac f/u in Feb      Relevant Orders   Brain natriuretic peptide (Completed)

## 2015-08-08 NOTE — Addendum Note (Signed)
Addended by: Roena MaladyEVONTENNO, Eyden Dobie Y on: 08/08/2015 11:39 AM   Modules accepted: Orders

## 2015-08-08 NOTE — Patient Instructions (Signed)
Keep legs elevated when able (the higher the better) Wear support hose if you have help to get them on  Reminders to drink water are helpful Lower sodium diet is helpful Lab today for BNP - a test for heart failure  Will update you  See cardiology in Feb as planned   Update me if any changes   I do not want to swap hctz to lasix yet - but it is a future option

## 2015-08-08 NOTE — Assessment & Plan Note (Signed)
LE  Suspect multifactorial  (? Venous insuff) Dementia- looking for long term care Unable to remember to eat less sodium or drink more water No help to get supp hose on either  In light of sob- check BNP Has cardiac f/u in Feb

## 2015-08-09 NOTE — Assessment & Plan Note (Signed)
bp in fair control at this time  BP Readings from Last 1 Encounters:  08/08/15 132/86   No changes needed Disc lifstyle change with low sodium diet and exercise  Reviewed last labs

## 2015-08-09 NOTE — Assessment & Plan Note (Signed)
Unsure if related to pedal edema (check BNP today) or if it is from deconditioning  Not severe  No crackles on pulm exam

## 2015-08-10 ENCOUNTER — Telehealth: Payer: Self-pay | Admitting: Family Medicine

## 2015-08-10 DIAGNOSIS — R600 Localized edema: Secondary | ICD-10-CM

## 2015-08-10 DIAGNOSIS — R0600 Dyspnea, unspecified: Secondary | ICD-10-CM

## 2015-08-10 NOTE — Telephone Encounter (Signed)
-----   Message from [place], [place] sent at 08/09/2015 12:45 PM EST ----- Spoke with pt's son and I advised him of pt's lab results and Dr. Royden Purlower's comments, son does want pt to be seen sooner then Feb so he said it's okay, not sure if we need to put a referral in or not to have her seen earlier?

## 2015-08-10 NOTE — Telephone Encounter (Signed)
I will do cardiology referral

## 2015-08-22 ENCOUNTER — Telehealth: Payer: Self-pay | Admitting: Family Medicine

## 2015-08-22 NOTE — Telephone Encounter (Signed)
[name] DustmanPaula [name] with [employer] long term benefits called and would like to schedule a peer-to-peer between Dr. Magdalene RiverMatthew Lundquist and Dr. Milinda Antisower re: pt's range of motion.  Please advise if you would like to schedule this review of pt symptoms.  Best number to call [name] [name] is [phone]

## 2015-08-22 NOTE — Telephone Encounter (Signed)
Can this be done with a form or do I have to talk to someone on the phone?

## 2015-08-23 NOTE — Telephone Encounter (Signed)
Left a message for [name] [name] asking if this information can be faxed to us per provider request.  Left fax number and my contact information / lt

## 2015-08-23 NOTE — Telephone Encounter (Signed)
Thanks

## 2015-08-29 NOTE — Telephone Encounter (Signed)
Called Kerri PerchesKathy Kinney and explained that we have been contacted by Dyane DustmanPaula Greenfield, however, she has not returned our calls.  She will get her husband to contact [employer] and call me 08/29/14 am.  / lt

## 2015-08-29 NOTE — Telephone Encounter (Signed)
Please request them to fax paperwork for me to fill out -if not, a phone number to call

## 2015-08-29 NOTE — Telephone Encounter (Signed)
[name] PerchesKathy [name] (Healthcare POA per daughter) pts daughter left v/m requesting Dr Milinda Antisower [facility]; [name] trying to get long term care ins to pay for some of pts care. Pt is saying she is eating but pt is not eating and pt is not bathing. [name] request not to call pt because pt will not remember call.[name] request cb.

## 2015-08-30 NOTE — Telephone Encounter (Signed)
Dyane DustmanPaula Greenfield from [employer] called and stated that Alger MemosNeil Williams from Dr. Chaya JanLundquists office will be calling to schedule a conference call with Dr. Milinda Antisower.  They will also be faxing additional questions.  Should hear from their office within the next 2 days.  If Lloyd Hugereil calls, check with Dr. Milinda Antisower about the best time for a peer to peer conversation (most likely Lunch hour)

## 2015-08-31 ENCOUNTER — Telehealth: Payer: Self-pay | Admitting: Family Medicine

## 2015-08-31 MED ORDER — MIRTAZAPINE 15 MG PO TABS: 15.0000 mg | ORAL_TABLET | Freq: Every day | ORAL | Status: DC

## 2015-08-31 MED ORDER — DOXYCYCLINE HYCLATE 100 MG PO TABS: 100.0000 mg | ORAL_TABLET | Freq: Two times a day (BID) | ORAL | Status: DC

## 2015-08-31 NOTE — Telephone Encounter (Signed)
[name] called to let you know that ms Kirchhoff ankles are swollen.  Pt stated right ankle is red but not painful and more swollen than left.  [name] stated pt is really nervious and cannot rest.  [name] stated pt is wanting to take more of her zolft for nervious.  Can you give her something for this?  They are trying to get her long term insurance to help pay to getsome help.  You will be getting some paperwork to fill out for this  Please advise   [name] work number [phone] till 3:30

## 2015-08-31 NOTE — Telephone Encounter (Signed)
Notified pt.'s daughter of Dr. Lucretia Roersowers directions and Rx sent to pharmacy.Maeola Sarah/tvw

## 2015-08-31 NOTE — Telephone Encounter (Signed)
Daughter [name] said they are willing to increase her mirtazapine to what ever dose you recommend. Daughter said she isn't SOB at all but she thinks pt's anxiety is worsening because pt is really concerned about her ankle. Pt is using support hose and she is elevating leg with pillows when she is sitting. Pt isn't able to do a lot of walking due to her ankle issues. Daughter said pt told her it doesn't hurt but her ankle is red, and swollen. Daughter called Dr. Wilmon ArmsKline's office but they can't get her in any earlier then her appt on 09/12/15

## 2015-08-31 NOTE — Telephone Encounter (Signed)
Because her ankle is red-let's cover with an abx in case of infx Keep appt on 1/17 also  If skin breakdown/ulceration is present-let me know and keep pressure off of it  Please call in doxycycline Also inc mirtazapine from 7.5 to 15 mg once daily to see if this helps with anxiety Update me if side effects or problems or if anything worsens

## 2015-08-31 NOTE — Telephone Encounter (Signed)
Would she be open to increasing her mirtazapine dose to help with anxiety?  Is she sob at all?   I know her swelling has been going on for a while and it is impossible to get her to wear support hose - correct?

## 2015-09-01 ENCOUNTER — Telehealth: Payer: Self-pay | Admitting: Family Medicine

## 2015-09-01 NOTE — Telephone Encounter (Signed)
Pt's daughter, [name], called to inform us that she has sent over a fax with some information on it for the pt that she would like Dr. Milinda Antisower to review before the [employer] paperwork is filled out. If you have any questions you can reach [facility] at work today until 3:30pm at [phone] or call her cell phone if after 3:30pm at [phone].

## 2015-09-01 NOTE — Telephone Encounter (Signed)
I got it -still waiting on [employer] paperwork Will do it when it arrives

## 2015-09-08 NOTE — Telephone Encounter (Signed)
No -I have not  Perhaps it needs to be called for

## 2015-09-08 NOTE — Telephone Encounter (Signed)
Juliann Pulse (Daughter) called to see if you received any paperwork from [employer] Please advise Best number [phone] Work # [phone]

## 2015-09-11 ENCOUNTER — Other Ambulatory Visit: Payer: Self-pay | Admitting: Family Medicine

## 2015-09-11 NOTE — Telephone Encounter (Signed)
Ok-please deny it

## 2015-09-11 NOTE — Telephone Encounter (Signed)
Received refill request electronically Last refill 08/31/15 #2o Last office visit 08/07/15 Is it okay to refill?

## 2015-09-11 NOTE — Telephone Encounter (Signed)
This was for ankle redness/suspected infection  Please ask family if it's better /improved

## 2015-09-11 NOTE — Telephone Encounter (Signed)
Spoke to patient's daughter [name]([name]) and was advised that patient has an appointment scheduled with her cardiologist tomorrow because of the swelling in her ankles. [name] stated that she does not know who requested the refill, but to hold off on it until patient sees the cardiologist tomorrow.

## 2015-09-12 ENCOUNTER — Encounter: Payer: Self-pay | Admitting: Family Medicine

## 2015-09-12 ENCOUNTER — Telehealth: Payer: Self-pay | Admitting: Internal Medicine

## 2015-09-12 ENCOUNTER — Other Ambulatory Visit: Payer: Self-pay | Admitting: Cardiovascular Disease

## 2015-09-12 ENCOUNTER — Encounter: Payer: Self-pay | Admitting: Internal Medicine

## 2015-09-12 ENCOUNTER — Ambulatory Visit (INDEPENDENT_AMBULATORY_CARE_PROVIDER_SITE_OTHER): Payer: Commercial Managed Care - HMO | Admitting: Internal Medicine

## 2015-09-12 VITALS — BP 116/62 | HR 145 | Ht 62.0 in | Wt 149.0 lb

## 2015-09-12 DIAGNOSIS — Z01812 Encounter for preprocedural laboratory examination: Secondary | ICD-10-CM

## 2015-09-12 DIAGNOSIS — R001 Bradycardia, unspecified: Secondary | ICD-10-CM | POA: Diagnosis not present

## 2015-09-12 DIAGNOSIS — I4819 Other persistent atrial fibrillation: Secondary | ICD-10-CM | POA: Insufficient documentation

## 2015-09-12 DIAGNOSIS — R32 Unspecified urinary incontinence: Secondary | ICD-10-CM | POA: Insufficient documentation

## 2015-09-12 DIAGNOSIS — I5041 Acute combined systolic (congestive) and diastolic (congestive) heart failure: Secondary | ICD-10-CM

## 2015-09-12 DIAGNOSIS — I481 Persistent atrial fibrillation: Secondary | ICD-10-CM

## 2015-09-12 DIAGNOSIS — I480 Paroxysmal atrial fibrillation: Secondary | ICD-10-CM

## 2015-09-12 LAB — CUP PACEART INCLINIC DEVICE CHECK
Battery Impedance: 1699 Ohm
Battery Voltage: 2.76 V
Brady Statistic AP VS Percent: 70 %
Brady Statistic AS VP Percent: 0 %
Date Time Interrogation Session: 20170117121502
Implantable Lead Implant Date: 20090514
Implantable Lead Location: 753860
Implantable Lead Model: 5076
Implantable Lead Model: 5076
Lead Channel Impedance Value: 361 Ohm
Lead Channel Impedance Value: 437 Ohm
Lead Channel Sensing Intrinsic Amplitude: 8 mV
Lead Channel Setting Pacing Amplitude: 2.5 V
Lead Channel Setting Sensing Sensitivity: 2.8 mV
MDC IDC LEAD IMPLANT DT: 20090514
MDC IDC LEAD LOCATION: 753859
MDC IDC MSMT BATTERY REMAINING LONGEVITY: 39 mo
MDC IDC MSMT LEADCHNL RA SENSING INTR AMPL: 0.35 mV
MDC IDC SET LEADCHNL RA PACING AMPLITUDE: 2 V
MDC IDC SET LEADCHNL RV PACING PULSEWIDTH: 0.4 ms
MDC IDC STAT BRADY AP VP PERCENT: 1 %
MDC IDC STAT BRADY AS VS PERCENT: 29 %

## 2015-09-12 MED ORDER — FUROSEMIDE 40 MG PO TABS: ORAL_TABLET | ORAL | Status: DC

## 2015-09-12 MED ORDER — AMIODARONE HCL 400 MG PO TABS: 400.0000 mg | ORAL_TABLET | Freq: Two times a day (BID) | ORAL | Status: DC

## 2015-09-12 MED ORDER — APIXABAN 2.5 MG PO TABS: 2.5000 mg | ORAL_TABLET | Freq: Two times a day (BID) | ORAL | Status: DC

## 2015-09-12 NOTE — Telephone Encounter (Signed)
Daughter notified we have not received fax, she will have [employer] fax it directly to my fax #

## 2015-09-12 NOTE — Progress Notes (Signed)
    Patient Care Team: Marne A Tower, MD as PCP - General   HPI  [name] [name] is a 82 y.o. female Seen in followup for pacemaker implantation for tachybradycardia syndrome. She has a history of paroxysmal atrial fibrillation. She has been intolerant of multiple anticoagulants; She has had prior hospitalization for GI bleeding. Anticoagulation was discontinued. This note was reviewed. Dr. Perry had done endoscopic evaluation. She had AVMs that were treated. She is intercurrently been treated with low-dose aspirin.  She comes in now with progressive weakness and swelling. This is been progressive over recent months. It is been associated with increasing exercise intolerance and fatigue . Most recently she was started on antibiotic.  She has not used remote monitoring.  Her major problem now is hip pain.this has caused her some concern regarding balance and stability. She is requesting a walker.   Past Medical History  Diagnosis Date  . Allergic rhinitis   . Anxiety   . Depression   . GERD (gastroesophageal reflux disease)   . Hypertension   . Osteopenia   . Bradycardia   . Hyperlipidemia   . Duodenal ulcer   . Other specified cardiac dysrhythmias(427.89)   . Diverticulosis   . GI bleed   . Atrial fibrillation (HCC)   . Anemia associated with acute blood loss   . Diverticulosis   . Shortness of breath dyspnea     with exertion  . Presence of permanent cardiac pacemaker     Past Surgical History  Procedure Laterality Date  . Appendectomy    . Total abdominal hysterectomy    . Tonsillectomy    . Ankle fracture surgery Right 05/2002  . Insert / replace / remove pacemaker    . Cardiac defibrillator placement    . Esophagogastroduodenoscopy N/A 06/24/2013    Procedure: ESOPHAGOGASTRODUODENOSCOPY (EGD);  Surgeon: John N Perry, MD;  Location: [facility];  Service: Endoscopy;  Laterality: N/A;  . Colonoscopy N/A 06/25/2013    Procedure: COLONOSCOPY;  Surgeon: John  N Perry, MD;  Location: [facility];  Service: Endoscopy;  Laterality: N/A;  . Total hip arthroplasty Left 10/11/2014    DR MURPHY  . Total hip arthroplasty Left 10/11/2014    Procedure: TOTAL HIP ARTHROPLASTY ANTERIOR APPROACH;  Surgeon: Timothy D Murphy, MD;  Location: [facility] OR;  Service: Orthopedics;  Laterality: Left;    Current Outpatient Prescriptions  Medication Sig Dispense Refill  . aspirin 81 MG tablet Take 81 mg by mouth daily.    . Cholecalciferol (VITAMIN D3) 2000 UNITS TABS Take 1 capsule by mouth daily.    . [employer] SENNA 8.6 MG tablet TAKE 2 TABLETS BY MOUTH TWICE A DAY AT 8AM AND ALSP AT 5PM 120 tablet 11  . donepezil (ARICEPT) 10 MG tablet Take 1 tablet by mouth daily.    . doxycycline (VIBRA-TABS) 100 MG tablet Take 1 tablet (100 mg total) by mouth 2 (two) times daily. 20 tablet 0  . hydrochlorothiazide (HYDRODIURIL) 25 MG tablet TAKE 1 TABLET BY MOUTH ONCE A DAY 30 tablet 11  . memantine (NAMENDA) 5 MG tablet Take 5 mg by mouth 2 (two) times daily.  3  . metoprolol tartrate (LOPRESSOR) 25 MG tablet Take 1 tablet (25 mg total) by mouth 2 (two) times daily. 180 tablet 3  . mirtazapine (REMERON) 15 MG tablet Take 1 tablet (15 mg total) by mouth at bedtime. 30 tablet 11  . Multiple Vitamin (MULTIVITAMIN) tablet Take 1 tablet by mouth daily.      .   omeprazole (PRILOSEC) 20 MG capsule TAKE 1 CAPSULE EVERY DAY 90 capsule 3  . sertraline (ZOLOFT) 100 MG tablet Take 1 tablet (100 mg total) by mouth daily. 90 tablet 3  . SUPER B COMPLEX/C PO Take 1 capsule by mouth daily.     No current facility-administered medications for this visit.    Allergies  Allergen Reactions  . Ace Inhibitors     REACTION: cough  . Alendronate Sodium     REACTION: aches  . Avapro [Irbesartan]     hypotension  . Codeine     Dizzy and nauseated   . Dabigatran Etexilate Mesylate Diarrhea    indigestion  . Nitrofurantoin     REACTION: rash  . Penicillins     REACTION: rash  . Prednisone Other (See  Comments)    Extreme weakness  . Trazodone And Nefazodone Other (See Comments)    Nightmares     Review of Systems negative except from HPI and PMH  Physical Exam Ht 5' 2" (1.575 m) Device pocket well healed; without hematoma or erythema.  There is no tethering  Well developed and nourished in no acute distress HENT normal Neck supple with 8-10 Clear Device pocket well healed; without hematoma or erythema.  There is no tethering  Regular rate and rhythm, no murmurs or gallops Abd-soft with active BS No Clubbing cyanosis 2+ brawny edema Skin-warm and dry A & Oriented  Grossly normal sensory and motor function  ECG demonstrates atrial fibrillation with a rapid rate at 152 Right axis deviation at 240 which is new but further around to the left   Assessment and plan  Sinus node dysfunction and chronotropic incompetence 100% atrial pacing  Atrial fibrillation sustainered persistent with RVR   Not on anticoagulation secondary to GI bleeding  Hypertension   Acute heart failure? diastolic  Pacemaker Medtronic The patient's device was interrogated.  The information was reviewed. No changes were made in the programming.       She is acutely decompensated heart failure  This may be related to the atrial fib and RVR which seen, by device interrogation to at least go back to early November and perhaps all the way back to August. With her relatively low blood pressure interval L efforts to slow her heart rate will be a challenge. Because of the persistence of atrial fibrillation and its association with her heart failure, I discussed with the family the role of antiarrhythmic therapy and we have elected to start her on amiodarone. We have discussed side effects.  I wonder whether we will see evidence of related cardiomyopathy at echo. Her last imaging was done in 2009 with normal LV function.  She is severely volume overloaded. We will hold her HCTZ for 7 days and begin her on  furosemide at 20-40 mg a day;  whichever it takes to get her to urinate.  We have discussed extensively the issue of anticoagulation. I reviewed with them the Avveroes data demonstrating equivalence of risk between aspirin and apixaban. We will need to keep track of her hemoglobin not withstanding. We have also discussed the dosing of her apixaban. Her dry weight is measured in November was less than 60 kg. She is significantly volume overloaded. Hence we will use 2.5 mg a day  More than 50% of 45 min was spent in counseling related to the above     

## 2015-09-12 NOTE — Telephone Encounter (Signed)
S/w pt daughter, [name], who asks for lasix clarification.  Instructed to take lasix  once daily for 7 days as printed in today's AVS. Daughter asks if someone should stay w/pt for 24 hours after Friday's TEE and DCCV. Advised daughter to have someone stay as instructed.  She verbalized understanding with no further questions.

## 2015-09-12 NOTE — Telephone Encounter (Signed)
Dr. Milinda Antis did receive forms and will fill out as much as she can tomorrow during call with [employer]

## 2015-09-12 NOTE — Telephone Encounter (Signed)
Called and spoke with [name] [name] with Dr. Scarlett Presto office is checking on appointment date and time for a peer-peer conference call [phone], date given which should work for Dr. Milinda Antis is 09/13/15 at 1:30 pm EST. Spoke to daughter and told her we are trying to get this call facilitated as soon as possible.   / lt

## 2015-09-12 NOTE — Telephone Encounter (Signed)
Patient having procedure and daughter has question about post op instructions.  Patient was instructed to have someone with her for 24 hrs after procedure.  Daughter and son will not be available to stay .  Do they need to find someone else or can she stay alone?  Patient was also instructed to take 7 days of fluid pill but was given 30.  Should she still continue to take until gone?   Please call daughter [name] to discuss.

## 2015-09-12 NOTE — Telephone Encounter (Signed)
Called and spoke with [name] [name] with Dr. Matthew Lundquists office is checking on appointment date and time for a peer-peer conference call [phone], date given which should work for Dr. Tower is 09/13/15 at 1:30 pm EST. Spoke to daughter and told her we are trying to get this call facilitated as soon as possible.   / lt ° °

## 2015-09-12 NOTE — Patient Instructions (Addendum)
Medication Instructions: 1) Stop aspirin 2) Stop HCTZ 3) Start Eliquis 2.5 mg one tablet by mouth twice daily 4) Start Amiodarone 400 mg one tablet by mouth twice daily 5) Start Lasix (furosemide) 40 mg one tablet by mouth once daily x 7 days  Labwork: - Your physician recommends that you have lab work today: BMP/ CBC  Procedures/Testing: - Your physician has requested that you have a TEE/Cardioversion. During a TEE, sound waves are used to create images of your heart. It provides your doctor with information about the size and shape of your heart and how well your heart's chambers and valves are working. In this test, a transducer is attached to the end of a flexible tube that is guided down you throat and into your esophagus (the tube leading from your mouth to your stomach) to get a more detailed image of your heart. Once the TEE has determined that a blood clot is not present, the cardioversion begins. Electrical Cardioversion uses a jolt of electricity to your heart either through paddles or wired patches attached to your chest. This is a controlled, usually prescheduled, procedure. This procedure is done at the hospital and you are not awake during the procedure. You usually go home the day of the procedure. Please see the instruction sheet given to you today for more information.  Follow-Up: - Your physician recommends that you schedule a follow-up appointment in: 3- 4 weeks with Dr. Graciela Husbands.  Any Additional Special Instructions Will Be Listed Below (If Applicable).   TEE/ Cardioversion: - You are scheduled for you TEE/ Cardioversion on Friday 09/15/15 at 7:30 am with Dr. Kirke Corin. - Please arrive at the [facility] at [facility] at 6:30 am - Nothing to eat or drink after midnight the night before your procedure - Please hold your lasix (fursemide) the morning of your procedure. You may take all of your other medications with enough water to get them down safely the morning of your procedure. -  Someone will need to drive you home from the procedure.

## 2015-09-12 NOTE — Telephone Encounter (Signed)
Do I need to dial them or are they gonna call me?   Thanks

## 2015-09-13 LAB — BASIC METABOLIC PANEL
BUN / CREAT RATIO: 22 (ref 11–26)
BUN: 25 mg/dL (ref 10–36)
CHLORIDE: 100 mmol/L (ref 96–106)
CO2: 29 mmol/L (ref 18–29)
Calcium: 8.8 mg/dL (ref 8.7–10.3)
Creatinine, Ser: 1.16 mg/dL — ABNORMAL HIGH (ref 0.57–1.00)
GFR calc non Af Amer: 42 mL/min/{1.73_m2} — ABNORMAL LOW (ref >59)
GFR, EST AFRICAN AMERICAN: 48 mL/min/{1.73_m2} — AB (ref >59)
Glucose: 96 mg/dL (ref 65–99)
Potassium: 4.1 mmol/L (ref 3.5–5.2)
SODIUM: 143 mmol/L (ref 134–144)

## 2015-09-13 LAB — CBC WITH DIFFERENTIAL/PLATELET
BASOS ABS: 0 10*3/uL (ref 0.0–0.2)
Basos: 0 %
EOS (ABSOLUTE): 0.1 10*3/uL (ref 0.0–0.4)
Eos: 1 %
Hematocrit: 42.4 % (ref 34.0–46.6)
Hemoglobin: 14 g/dL (ref 11.1–15.9)
IMMATURE GRANS (ABS): 0 10*3/uL (ref 0.0–0.1)
Immature Granulocytes: 0 %
LYMPHS ABS: 1.8 10*3/uL (ref 0.7–3.1)
LYMPHS: 26 %
MCH: 30.8 pg (ref 26.6–33.0)
MCHC: 33 g/dL (ref 31.5–35.7)
MCV: 93 fL (ref 79–97)
Monocytes Absolute: 0.8 10*3/uL (ref 0.1–0.9)
Monocytes: 12 %
NEUTROS ABS: 4.2 10*3/uL (ref 1.4–7.0)
Neutrophils: 61 %
PLATELETS: 291 10*3/uL (ref 150–379)
RBC: 4.55 x10E6/uL (ref 3.77–5.28)
RDW: 15.7 % — ABNORMAL HIGH (ref 12.3–15.4)
WBC: 6.9 10*3/uL (ref 3.4–10.8)

## 2015-09-14 NOTE — Telephone Encounter (Signed)
Called and spoke with pt's daughter to inform her that neither Alger Memos or Dr. Juliann Pulse called yesterday 09/13/15 at the appointed time of 1:30 pm EST.  She will be talking to [facility] and communicating any changes or concerns to our office.

## 2015-09-14 NOTE — Telephone Encounter (Signed)
Thanks

## 2015-09-14 NOTE — Telephone Encounter (Signed)
Bryn Gulling from [employer] called to set up appointment for Peer-to-peer review with Dr. Milinda Antis.  They wanted today, I explained that Dr. Milinda Antis is out of the office today.  Looked at Dr. Royden Purl schedule and thought that Tuesday 09/19/15 at 1:30 pm EST looks like it is a good time.  I will confer with Dr. Milinda Antis and confirm date and time to [id number] / lt

## 2015-09-14 NOTE — Telephone Encounter (Signed)
Received call from Dyane Dustman, told her we have confirmed peer-to-peer call with Dr. Juliann Pulse and Dr. Milinda Antis rescheduled for 09/19/15 1:30 pm EST.  I called Daughter Kerri Perches [phone] to inform that we have rescheduled and hope to have quick resolution after 09/19/15 / lt

## 2015-09-14 NOTE — Telephone Encounter (Signed)
Called and spoke with pt's daughter to inform her that neither Neil Williams or Dr. Lundquist called yesterday 09/13/15 at the appointed time of 1:30 pm EST.  She will be talking to [employer] and communicating any changes or concerns to our office.  °

## 2015-09-14 NOTE — Telephone Encounter (Signed)
That is fine 

## 2015-09-15 ENCOUNTER — Encounter: Payer: Self-pay | Admitting: *Deleted

## 2015-09-15 ENCOUNTER — Ambulatory Visit (HOSPITAL_COMMUNITY)
Admission: RE | Admit: 2015-09-15 | Discharge: 2015-09-15 | Disposition: A | Discharge status: Home / Self Care | Payer: Commercial Managed Care - HMO | Source: Ambulatory Visit | Attending: Cardiovascular Disease | Admitting: Cardiovascular Disease

## 2015-09-15 ENCOUNTER — Encounter
Admission: AD | Disposition: A | Discharge status: Hospice | Payer: Self-pay | Source: Ambulatory Visit | Attending: Internal Medicine

## 2015-09-15 ENCOUNTER — Inpatient Hospital Stay
Admission: AD | Admit: 2015-09-15 | Discharge: 2015-09-24 | DRG: 291 | Disposition: A | Discharge status: Hospice | Payer: Commercial Managed Care - HMO | Source: Ambulatory Visit | Attending: Internal Medicine | Admitting: Internal Medicine

## 2015-09-15 ENCOUNTER — Inpatient Hospital Stay: Payer: Commercial Managed Care - HMO

## 2015-09-15 ENCOUNTER — Ambulatory Visit: Payer: Commercial Managed Care - HMO | Admitting: Anesthesiology

## 2015-09-15 DIAGNOSIS — F0391 Unspecified dementia with behavioral disturbance: Secondary | ICD-10-CM | POA: Diagnosis present

## 2015-09-15 DIAGNOSIS — I480 Paroxysmal atrial fibrillation: Secondary | ICD-10-CM | POA: Diagnosis not present

## 2015-09-15 DIAGNOSIS — I130 Hypertensive heart and chronic kidney disease with heart failure and stage 1 through stage 4 chronic kidney disease, or unspecified chronic kidney disease: Secondary | ICD-10-CM | POA: Diagnosis present

## 2015-09-15 DIAGNOSIS — Z515 Encounter for palliative care: Secondary | ICD-10-CM | POA: Diagnosis present

## 2015-09-15 DIAGNOSIS — R000 Tachycardia, unspecified: Secondary | ICD-10-CM

## 2015-09-15 DIAGNOSIS — E785 Hyperlipidemia, unspecified: Secondary | ICD-10-CM | POA: Diagnosis present

## 2015-09-15 DIAGNOSIS — G9340 Encephalopathy, unspecified: Secondary | ICD-10-CM

## 2015-09-15 DIAGNOSIS — Z79899 Other long term (current) drug therapy: Secondary | ICD-10-CM

## 2015-09-15 DIAGNOSIS — E876 Hypokalemia: Secondary | ICD-10-CM | POA: Diagnosis present

## 2015-09-15 DIAGNOSIS — I481 Persistent atrial fibrillation: Secondary | ICD-10-CM | POA: Diagnosis present

## 2015-09-15 DIAGNOSIS — K219 Gastro-esophageal reflux disease without esophagitis: Secondary | ICD-10-CM | POA: Diagnosis present

## 2015-09-15 DIAGNOSIS — J9602 Acute respiratory failure with hypercapnia: Secondary | ICD-10-CM | POA: Diagnosis present

## 2015-09-15 DIAGNOSIS — I4891 Unspecified atrial fibrillation: Secondary | ICD-10-CM | POA: Diagnosis present

## 2015-09-15 DIAGNOSIS — R0602 Shortness of breath: Secondary | ICD-10-CM

## 2015-09-15 DIAGNOSIS — I5033 Acute on chronic diastolic (congestive) heart failure: Secondary | ICD-10-CM

## 2015-09-15 DIAGNOSIS — I340 Nonrheumatic mitral (valve) insufficiency: Secondary | ICD-10-CM

## 2015-09-15 DIAGNOSIS — N183 Chronic kidney disease, stage 3 (moderate): Secondary | ICD-10-CM | POA: Diagnosis present

## 2015-09-15 DIAGNOSIS — I959 Hypotension, unspecified: Secondary | ICD-10-CM | POA: Diagnosis present

## 2015-09-15 DIAGNOSIS — I495 Sick sinus syndrome: Secondary | ICD-10-CM | POA: Diagnosis not present

## 2015-09-15 DIAGNOSIS — E874 Mixed disorder of acid-base balance: Secondary | ICD-10-CM | POA: Diagnosis present

## 2015-09-15 DIAGNOSIS — Z950 Presence of cardiac pacemaker: Secondary | ICD-10-CM

## 2015-09-15 DIAGNOSIS — N179 Acute kidney failure, unspecified: Secondary | ICD-10-CM | POA: Diagnosis present

## 2015-09-15 DIAGNOSIS — R680 Hypothermia, not associated with low environmental temperature: Secondary | ICD-10-CM | POA: Diagnosis present

## 2015-09-15 DIAGNOSIS — I509 Heart failure, unspecified: Secondary | ICD-10-CM

## 2015-09-15 DIAGNOSIS — R0902 Hypoxemia: Secondary | ICD-10-CM | POA: Diagnosis not present

## 2015-09-15 DIAGNOSIS — Z66 Do not resuscitate: Secondary | ICD-10-CM | POA: Diagnosis present

## 2015-09-15 DIAGNOSIS — Z7982 Long term (current) use of aspirin: Secondary | ICD-10-CM | POA: Diagnosis not present

## 2015-09-15 DIAGNOSIS — R5383 Other fatigue: Secondary | ICD-10-CM | POA: Diagnosis not present

## 2015-09-15 DIAGNOSIS — J9601 Acute respiratory failure with hypoxia: Secondary | ICD-10-CM | POA: Diagnosis present

## 2015-09-15 DIAGNOSIS — F419 Anxiety disorder, unspecified: Secondary | ICD-10-CM | POA: Diagnosis present

## 2015-09-15 DIAGNOSIS — Z96642 Presence of left artificial hip joint: Secondary | ICD-10-CM | POA: Diagnosis present

## 2015-09-15 DIAGNOSIS — N390 Urinary tract infection, site not specified: Secondary | ICD-10-CM | POA: Diagnosis present

## 2015-09-15 DIAGNOSIS — J9690 Respiratory failure, unspecified, unspecified whether with hypoxia or hypercapnia: Secondary | ICD-10-CM

## 2015-09-15 DIAGNOSIS — J90 Pleural effusion, not elsewhere classified: Secondary | ICD-10-CM

## 2015-09-15 DIAGNOSIS — B964 Proteus (mirabilis) (morganii) as the cause of diseases classified elsewhere: Secondary | ICD-10-CM | POA: Diagnosis present

## 2015-09-15 DIAGNOSIS — J9621 Acute and chronic respiratory failure with hypoxia: Secondary | ICD-10-CM | POA: Diagnosis not present

## 2015-09-15 DIAGNOSIS — R609 Edema, unspecified: Secondary | ICD-10-CM

## 2015-09-15 DIAGNOSIS — F329 Major depressive disorder, single episode, unspecified: Secondary | ICD-10-CM | POA: Diagnosis present

## 2015-09-15 DIAGNOSIS — R300 Dysuria: Secondary | ICD-10-CM | POA: Diagnosis not present

## 2015-09-15 HISTORY — DX: Chronic diastolic (congestive) heart failure: I50.32

## 2015-09-15 HISTORY — PX: TEE WITHOUT CARDIOVERSION: SHX5443

## 2015-09-15 HISTORY — DX: Paroxysmal atrial fibrillation: I48.0

## 2015-09-15 HISTORY — PX: ELECTROPHYSIOLOGIC STUDY: SHX172A

## 2015-09-15 LAB — BASIC METABOLIC PANEL
ANION GAP: 6 (ref 5–15)
BUN: 29 mg/dL — AB (ref 6–20)
CO2: 36 mmol/L — ABNORMAL HIGH (ref 22–32)
Calcium: 8.6 mg/dL — ABNORMAL LOW (ref 8.9–10.3)
Chloride: 99 mmol/L — ABNORMAL LOW (ref 101–111)
Creatinine, Ser: 1.22 mg/dL — ABNORMAL HIGH (ref 0.44–1.00)
GFR calc Af Amer: 44 mL/min — ABNORMAL LOW (ref >60)
GFR, EST NON AFRICAN AMERICAN: 38 mL/min — AB (ref >60)
Glucose, Bld: 121 mg/dL — ABNORMAL HIGH (ref 65–99)
POTASSIUM: 3.1 mmol/L — AB (ref 3.5–5.1)
SODIUM: 141 mmol/L (ref 135–145)

## 2015-09-15 LAB — HEMOGLOBIN: HEMOGLOBIN: 13.8 g/dL (ref 12.0–16.0)

## 2015-09-15 LAB — POTASSIUM: Potassium: 3.9 mmol/L (ref 3.5–5.1)

## 2015-09-15 LAB — MAGNESIUM: MAGNESIUM: 1.7 mg/dL (ref 1.7–2.4)

## 2015-09-15 SURGERY — CARDIOVERSION (CATH LAB)
Anesthesia: General

## 2015-09-15 MED ORDER — DONEPEZIL HCL 5 MG PO TABS
10.0000 mg | ORAL_TABLET | Freq: Every day | ORAL | Status: DC
  Administered 2015-09-15 – 2015-09-21 (×7): 10 mg via ORAL
  Filled 2015-09-15 (×7): qty 2

## 2015-09-15 MED ORDER — LIDOCAINE VISCOUS 2 % MT SOLN
OROMUCOSAL | Status: AC
  Filled 2015-09-15: qty 15

## 2015-09-15 MED ORDER — FENTANYL CITRATE (PF) 100 MCG/2ML IJ SOLN
INTRAMUSCULAR | Status: AC
  Filled 2015-09-15: qty 4

## 2015-09-15 MED ORDER — APIXABAN 2.5 MG PO TABS
2.5000 mg | ORAL_TABLET | Freq: Two times a day (BID) | ORAL | Status: DC
  Administered 2015-09-15 – 2015-09-18 (×7): 2.5 mg via ORAL
  Filled 2015-09-15 (×7): qty 1

## 2015-09-15 MED ORDER — MIRTAZAPINE 15 MG PO TABS
15.0000 mg | ORAL_TABLET | Freq: Every day | ORAL | Status: DC
  Administered 2015-09-15 – 2015-09-21 (×6): 15 mg via ORAL
  Filled 2015-09-15 (×6): qty 1

## 2015-09-15 MED ORDER — ALPRAZOLAM 0.25 MG PO TABS
0.2500 mg | ORAL_TABLET | ORAL | Status: AC
  Administered 2015-09-15: 0.25 mg via ORAL
  Filled 2015-09-15: qty 1

## 2015-09-15 MED ORDER — MIDAZOLAM HCL 5 MG/5ML IJ SOLN
INTRAMUSCULAR | Status: AC
  Filled 2015-09-15: qty 10

## 2015-09-15 MED ORDER — PANTOPRAZOLE SODIUM 40 MG PO TBEC
40.0000 mg | DELAYED_RELEASE_TABLET | Freq: Every day | ORAL | Status: DC
  Administered 2015-09-15 – 2015-09-21 (×7): 40 mg via ORAL
  Filled 2015-09-15 (×7): qty 1

## 2015-09-15 MED ORDER — MEMANTINE HCL 5 MG PO TABS
5.0000 mg | ORAL_TABLET | Freq: Two times a day (BID) | ORAL | Status: DC
  Administered 2015-09-15 – 2015-09-21 (×12): 5 mg via ORAL
  Filled 2015-09-15 (×16): qty 1

## 2015-09-15 MED ORDER — PROPOFOL 10 MG/ML IV BOLUS
INTRAVENOUS | Status: DC | PRN
  Administered 2015-09-15: 40 mg via INTRAVENOUS
  Administered 2015-09-15: 60 mg via INTRAVENOUS

## 2015-09-15 MED ORDER — METOPROLOL TARTRATE 1 MG/ML IV SOLN: 5.0000 mg | Freq: Four times a day (QID) | INTRAVENOUS | Status: DC | PRN

## 2015-09-15 MED ORDER — VITAMIN D 1000 UNITS PO TABS
2000.0000 [IU] | ORAL_TABLET | Freq: Every day | ORAL | Status: DC
  Administered 2015-09-15 – 2015-09-21 (×7): 2000 [IU] via ORAL
  Filled 2015-09-15 (×7): qty 2

## 2015-09-15 MED ORDER — POTASSIUM CHLORIDE CRYS ER 20 MEQ PO TBCR
20.0000 meq | EXTENDED_RELEASE_TABLET | ORAL | Status: AC
  Administered 2015-09-15 (×2): 20 meq via ORAL
  Filled 2015-09-15: qty 1

## 2015-09-15 MED ORDER — SERTRALINE HCL 50 MG PO TABS
100.0000 mg | ORAL_TABLET | Freq: Every day | ORAL | Status: DC
  Administered 2015-09-15 – 2015-09-21 (×7): 100 mg via ORAL
  Filled 2015-09-15: qty 1
  Filled 2015-09-15: qty 2
  Filled 2015-09-15: qty 1
  Filled 2015-09-15: qty 2
  Filled 2015-09-15 (×3): qty 1

## 2015-09-15 MED ORDER — AMIODARONE HCL IN DEXTROSE 360-4.14 MG/200ML-% IV SOLN
30.0000 mg/h | INTRAVENOUS | Status: AC
  Administered 2015-09-15 – 2015-09-17 (×3): 30 mg/h via INTRAVENOUS
  Filled 2015-09-15 (×8): qty 200

## 2015-09-15 MED ORDER — ADULT MULTIVITAMIN W/MINERALS CH
1.0000 | ORAL_TABLET | Freq: Every day | ORAL | Status: DC
  Administered 2015-09-15 – 2015-09-21 (×7): 1 via ORAL
  Filled 2015-09-15 (×8): qty 1

## 2015-09-15 MED ORDER — SODIUM CHLORIDE 0.9 % IJ SOLN
INTRAMUSCULAR | Status: AC
  Administered 2015-09-15: 08:00:00
  Filled 2015-09-15: qty 3

## 2015-09-15 MED ORDER — SENNA 8.6 MG PO TABS
1.0000 | ORAL_TABLET | Freq: Every day | ORAL | Status: DC
  Administered 2015-09-15 – 2015-09-21 (×5): 8.6 mg via ORAL
  Filled 2015-09-15 (×5): qty 1

## 2015-09-15 MED ORDER — SODIUM CHLORIDE 0.9 % IV SOLN
INTRAVENOUS | Status: DC
  Administered 2015-09-15: 07:00:00 via INTRAVENOUS

## 2015-09-15 MED ORDER — POTASSIUM CHLORIDE CRYS ER 20 MEQ PO TBCR: 30.0000 meq | EXTENDED_RELEASE_TABLET | Freq: Every day | ORAL | Status: DC

## 2015-09-15 MED ORDER — METOPROLOL TARTRATE 25 MG PO TABS
25.0000 mg | ORAL_TABLET | Freq: Two times a day (BID) | ORAL | Status: DC
  Administered 2015-09-15 – 2015-09-17 (×5): 25 mg via ORAL
  Filled 2015-09-15 (×6): qty 1

## 2015-09-15 MED ORDER — BUTAMBEN-TETRACAINE-BENZOCAINE 2-2-14 % EX AERO
INHALATION_SPRAY | CUTANEOUS | Status: AC
  Filled 2015-09-15: qty 20

## 2015-09-15 MED ORDER — AMIODARONE HCL IN DEXTROSE 360-4.14 MG/200ML-% IV SOLN
60.0000 mg/h | INTRAVENOUS | Status: AC
  Administered 2015-09-15: 60 mg/h via INTRAVENOUS
  Filled 2015-09-15: qty 200

## 2015-09-15 MED ORDER — AMIODARONE HCL 200 MG PO TABS
400.0000 mg | ORAL_TABLET | Freq: Two times a day (BID) | ORAL | Status: DC
  Administered 2015-09-15: 400 mg via ORAL
  Filled 2015-09-15: qty 2

## 2015-09-15 MED ORDER — FUROSEMIDE 10 MG/ML IJ SOLN
20.0000 mg | Freq: Three times a day (TID) | INTRAMUSCULAR | Status: DC
  Administered 2015-09-15 – 2015-09-16 (×4): 20 mg via INTRAVENOUS
  Filled 2015-09-15 (×4): qty 2

## 2015-09-15 MED ORDER — ALPRAZOLAM 0.25 MG PO TABS
0.2500 mg | ORAL_TABLET | Freq: Three times a day (TID) | ORAL | Status: DC | PRN
  Administered 2015-09-15 – 2015-09-21 (×7): 0.25 mg via ORAL
  Filled 2015-09-15 (×9): qty 1

## 2015-09-15 MED ORDER — AMIODARONE LOAD VIA INFUSION
150.0000 mg | Freq: Once | INTRAVENOUS | Status: AC
  Administered 2015-09-15: 150 mg via INTRAVENOUS
  Filled 2015-09-15: qty 83.34

## 2015-09-15 NOTE — Consult Note (Addendum)
MEDICATION RELATED CONSULT NOTE - INITIAL   Pharmacy Consult for Potassium Replacement Indication: Hypokalemia  Allergies  Allergen Reactions  . Ace Inhibitors     REACTION: cough  . Alendronate Sodium     REACTION: aches  . Avapro [Irbesartan]     hypotension  . Codeine     Dizzy and nauseated   . Dabigatran Etexilate Mesylate Diarrhea    indigestion  . Nitrofurantoin     REACTION: rash  . Penicillins     REACTION: rash  . Prednisone Other (See Comments)    Extreme weakness  . Trazodone And Nefazodone Other (See Comments)    Nightmares     Patient Measurements: Height:  (165.1 cm) Weight: 148 lb 12.8 oz (67.495 kg) IBW/kg (Calculated) : 57  Vital Signs: Temp: 98.5 F (36.9 C) (01/20 1039) Temp Source: Oral (01/20 1039) BP: 100/70 mmHg (01/20 1231) Pulse Rate: 114 (01/20 1231) Intake/Output from previous day:   Intake/Output from this shift: Total I/O In: 440 [P.O.:240; I.V.:200] Out: 400 [Urine:400]  Labs:  Recent Labs  09/15/15 1156  HGB 13.8  CREATININE 1.22*  MG 1.7   Estimated Creatinine Clearance: 27.6 mL/min (by C-G formula based on Cr of 1.22).  Microbiology: No results found for this or any previous visit (from the past 720 hour(s)).  Medical History: Past Medical History  Diagnosis Date  . Allergic rhinitis   . Anxiety   . Depression   . GERD (gastroesophageal reflux disease)   . Hypertension   . Osteopenia   . Bradycardia   . Hyperlipidemia   . Duodenal ulcer   . Other specified cardiac dysrhythmias(427.89)   . Diverticulosis   . GI bleed   . Atrial fibrillation (HCC)   . Anemia associated with acute blood loss   . Diverticulosis   . Shortness of breath dyspnea     with exertion  . Presence of permanent cardiac pacemaker     Medications:  Scheduled:  . apixaban  2.5 mg Oral BID  . butamben-tetracaine-benzocaine      . cholecalciferol  2,000 Units Oral Daily  . donepezil  10 mg Oral Daily  . furosemide  20 mg  Intravenous 3 times per day  . memantine  5 mg Oral BID  . metoprolol tartrate  25 mg Oral BID  . mirtazapine  15 mg Oral QHS  . multivitamin with minerals  1 tablet Oral Daily  . pantoprazole  40 mg Oral Daily  . potassium chloride  20 mEq Oral Q2H  . senna  1 tablet Oral QHS  . sertraline  100 mg Oral Daily   Infusions:  . amiodarone 60 mg/hr (09/15/15 1145)   Followed by  . amiodarone 30 mg/hr (09/15/15 1237)    Assessment: VG is a 82yo female admitted with acute congestive heart failure and chronic afib. Pharmacy consulted to replete potassium in this patient.  Plan:  Initiate KCl PO q2hrs x2 doses.  Recheck K in 6 hours. Meg level 1.7 today, no adjustment needed. Recheck with AM labs.  Pharmacy will continue to monitor.  Cy Blamer 09/15/2015,4:35 PM

## 2015-09-15 NOTE — Progress Notes (Addendum)
Pt requesting something to help with her nerves and help her sleep.  MD paged.  [phone]- MD, Dr. Anne Hahn to place order for xanax 0.25 TID prn for anxiety and sleep.  Will continue to monitor [name] [name]

## 2015-09-15 NOTE — Telephone Encounter (Signed)
Dyane Dustman from [facility] [facility] called and wants Dr. Milinda Antis to answer the questions as fully as possible and she is going to try and facilitate the case without the peer-to-peer phone call if possible.

## 2015-09-15 NOTE — Telephone Encounter (Signed)
Bryn Gulling. From [employer] called and said that they need to reschedule Dr. Luther Hearing peer-to-peer call with Dr. Milinda Antis to 09/26/15 at 1:30.  Will check with Dr. Milinda Antis and confirm that this time will work.  Also returned Otho Ket call from [employer] concerning pt's case.  Had to leave message.  Updated [employer] to the new change in date.  Best number to call Bryn Gulling. From [employer] is [phone]

## 2015-09-15 NOTE — CV Procedure (Signed)
Cardioversion note: A standard informed consent was obtained. Timeout was performed. The pads were placed in the anterior posterior fashion. The patient was given propofol by the anesthesia team. TEE was performed which showed no evidence of intracardiac thrombus. Successful cardioversion was performed with a 120 J. The patient converted to sinus rhythm. Pre-and post EKGs were reviewed. The patient tolerated the procedure with no immediate complications. She was hypoxic before cardioversion but this improved after.  Recommendations: The patient has evidence of fluid overload. She has resting hypoxia. Will recheck before discharge. If hypoxia persists, we might need to admit her for diuresis. Otherwise she will be discharged to follow-up as an outpatient. Continue same medications and follow-up in 2-3 weeks.

## 2015-09-15 NOTE — Progress Notes (Signed)
MD, Dr. Elpidio Anis notified of pts BP- 110/68 H R 90.  Pt on amiod drip at 16.6.  BP has been running soft.  MD approved night time dose of metoprolol. Louis Meckel

## 2015-09-15 NOTE — Care Management Note (Addendum)
Case Management Note  Patient Details  Name: [name] [name] MRN: [id number] Date of Birth: 08/16/1923  Subjective/Objective:  RNCM assessment for discharge needs. Met with patient, son (Eddie Schafer- [phone]) and daughter in law. Son states patient lives at home alone and he lives next to her. He and his wife give patient her medications, prepare her meals and assist with her care needs. They have a friend that comes in the home for about 4  Hours a week to assist with her care needs as well.  Patient uses a rollator. Son is concerned about her increased care needs. He states patient has a long term care policy ([employer] ?) and they have been working with her PCP and the Son is interested in STR, long term care and assisted living, which ever is most appropriate. He is also willing to have CM set up home health services in the event she is discharged home. Provided him with a list of home health agencies. I explained the process of PT evaluation. CSW updated on son's concerns.  Patient wil most likely need home O2 if discharged home.               Action/Plan: PT evaluation and follow progression.   Expected Discharge Date:                  Expected Discharge Plan:     In-House Referral:  Clinical Social Work  Discharge planning Services  CM Consult  Post Acute Care Choice:    Choice offered to:  Adult Children  DME Arranged:    DME Agency:     HH Arranged:    HH Agency:     Status of Service:  In process, will continue to follow  Medicare Important Message Given:    Date Medicare IM Given:    Medicare IM give by:    Date Additional Medicare IM Given:    Additional Medicare Important Message give by:     If discussed at Long Length of Stay Meetings, dates discussed:    Additional Comments:   M , RN 09/15/2015, 11:30 AM  

## 2015-09-15 NOTE — Progress Notes (Signed)
Post TEE and cardioversion. NSR. 2 L of oxygen. A & O. Amio drip was drip initiated. Family at the bedside.HR is stable. No pain. Pt has no further concerns at this time.

## 2015-09-15 NOTE — Progress Notes (Signed)
   Amiodarone gtt decreased to 1/2 rate. Heart rate 1-teens, improved from 140's. Heart rate will be difficult to control while volume overloaded. May need 2nd bolus, though infused at a slower rate, perhaps over 1 hour.

## 2015-09-15 NOTE — Consult Note (Signed)
MEDICATION RELATED CONSULT NOTE - INITIAL   Pharmacy Consult for Potassium Replacement Indication: Hypokalemia  Allergies  Allergen Reactions  . Ace Inhibitors     REACTION: cough  . Alendronate Sodium     REACTION: aches  . Avapro [Irbesartan]     hypotension  . Codeine     Dizzy and nauseated   . Dabigatran Etexilate Mesylate Diarrhea    indigestion  . Nitrofurantoin     REACTION: rash  . Penicillins     REACTION: rash  . Prednisone Other (See Comments)    Extreme weakness  . Trazodone And Nefazodone Other (See Comments)    Nightmares     Patient Measurements: Height:  (165.1 cm) Weight: 148 lb 12.8 oz (67.495 kg) IBW/kg (Calculated) : 57  Vital Signs: Temp: 98 F (36.7 C) (01/20 1933) Temp Source: Oral (01/20 1933) BP: 110/68 mmHg (01/20 1933) Pulse Rate: 90 (01/20 1933) Intake/Output from previous day:   Intake/Output from this shift: Total I/O In: -  Out: 600 [Urine:600]  Labs:  Recent Labs  09/15/15 1156  HGB 13.8  CREATININE 1.22*  MG 1.7   Estimated Creatinine Clearance: 27.6 mL/min (by C-G formula based on Cr of 1.22).  Microbiology: No results found for this or any previous visit (from the past 720 hour(s)).  Medical History: Past Medical History  Diagnosis Date  . Allergic rhinitis   . Anxiety   . Depression   . GERD (gastroesophageal reflux disease)   . Hypertension   . Osteopenia   . Bradycardia   . Hyperlipidemia   . Duodenal ulcer   . Other specified cardiac dysrhythmias(427.89)   . Diverticulosis   . GI bleed   . Atrial fibrillation (HCC)   . Anemia associated with acute blood loss   . Diverticulosis   . Shortness of breath dyspnea     with exertion  . Presence of permanent cardiac pacemaker     Medications:  Scheduled:  . apixaban  2.5 mg Oral BID  . cholecalciferol  2,000 Units Oral Daily  . donepezil  10 mg Oral Daily  . furosemide  20 mg Intravenous 3 times per day  . memantine  5 mg Oral BID  .  metoprolol tartrate  25 mg Oral BID  . mirtazapine  15 mg Oral QHS  . multivitamin with minerals  1 tablet Oral Daily  . pantoprazole  40 mg Oral Daily  . senna  1 tablet Oral QHS  . sertraline  100 mg Oral Daily   Infusions:  . amiodarone 30 mg/hr (09/15/15 1731)    Assessment: VG is a 82yo female admitted with acute congestive heart failure and chronic afib. Pharmacy consulted to replete potassium in this patient.  Plan:  Potassium level at 2303 3.9. No additional supplement ordered at this time. Will recheck with AM labs.   Carola Frost, Pharm.D., BCPS Clinical Pharmacist 09/15/2015,11:58 PM

## 2015-09-15 NOTE — Progress Notes (Signed)
*  PRELIMINARY RESULTS* Echocardiogram 2D Echocardiogram has been performed.  [name] [name] Hege 09/15/2015, 8:11 AM

## 2015-09-15 NOTE — Anesthesia Postprocedure Evaluation (Signed)
Anesthesia Post Note  Patient: [name] [name]  Procedure(s) Performed: Procedure(s) (LRB): CARDIOVERSION (N/A) TRANSESOPHAGEAL ECHOCARDIOGRAM (TEE) (N/A)  Patient location during evaluation: PACU Anesthesia Type: General Level of consciousness: awake Pain management: pain level controlled Vital Signs Assessment: post-procedure vital signs reviewed and stable Respiratory status: spontaneous breathing Cardiovascular status: blood pressure returned to baseline Postop Assessment: no headache Anesthetic complications: no    Last Vitals:  Filed Vitals:   09/15/15 0830 09/15/15 0845  BP: 114/79 111/79  Pulse: 62 97  Temp:    Resp: 22 33    Last Pain: There were no vitals filed for this visit.               Stella Bortle M

## 2015-09-15 NOTE — Anesthesia Preprocedure Evaluation (Addendum)
Anesthesia Evaluation  Patient identified by MRN, date of birth, ID band Patient awake    Reviewed: Allergy & Precautions, NPO status , Patient's Chart, lab work & pertinent test results  Airway Mallampati: II       Dental  (+) Teeth Intact, Caps   Pulmonary shortness of breath and at rest,     + decreased breath sounds      Cardiovascular Exercise Tolerance: Poor hypertension, + dysrhythmias Atrial Fibrillation and Ventricular Fibrillation + pacemaker + Cardiac Defibrillator  Rhythm:Irregular Rate:Tachycardia     Neuro/Psych Anxiety Depression    GI/Hepatic PUD, GERD  Medicated and Controlled,  Endo/Other    Renal/GU      Musculoskeletal  (+) Arthritis ,   Abdominal (+)  Abdomen: soft.    Peds  Hematology Hb 14.   Anesthesia Other Findings   Reproductive/Obstetrics                            Anesthesia Physical Anesthesia Plan  ASA: IV  Anesthesia Plan: General   Post-op Pain Management:    Induction: Intravenous  Airway Management Planned: Simple Face Mask  Additional Equipment:   Intra-op Plan:   Post-operative Plan:   Informed Consent: I have reviewed the patients History and Physical, chart, labs and discussed the procedure including the risks, benefits and alternatives for the proposed anesthesia with the patient or authorized representative who has indicated his/her understanding and acceptance.     Plan Discussed with: CRNA  Anesthesia Plan Comments:         Anesthesia Quick Evaluation

## 2015-09-15 NOTE — Consult Note (Signed)
Cardiology Consultation Note  Patient ID: [name] [name], MRN: [id number], DOB/AGE: June 13, 1923 82 y.o. Admit date: 09/15/2015   Date of Consult: 09/15/2015 Primary Physician: Roxy Manns, MD Primary Cardiologist: Dr. Graciela Husbands, MD  Chief Complaint: SOB Reason for Consult: Acute on chronic CHF  HPI: 82 y.o. female with h/o PAF on Eliquis, tachy-brady syndrome s/p MDT PPM, prior GIB leading to prior d/c of anticoagulation s/p EGD showing AVM's s/p treatment with intercurrent treatment with low-dose aspirin, and prior intolerance to multiple anticoagulants who has been volume overloaded and underwent successful outpatient TEE/DCCV this morning for Afib with RVR and remained in an intermittent paced rhythm for a brief amount of time, but unfortunately went back into Afib with RVR was admitted for volume overload and hypoxia in the setting of the above.   She was recently seen by Dr. Graciela Husbands on 09/12/2015 and noted to be volume overloaded at that time. It was felt her decompensated heart failure may have been related to her Afib with RVR. Device interrogation showed Afib with RVR going back to early November and perhaps all the way back to August. She was started on amiodarone 400 mg bid and she was changed from HCTZ to Lasix 20-40 mg daily for 7 days at that time. She was also started on Eliquis. She was scheduled for outpatient TEE/DCCV that she underwent this morning. She did convert to a paced rhythm for a brief amount of time, but unfortunately she did go back into Afib with RVR with HR into the 120's. She has been admitted for the management of this as well as diuresis. CXR showed small bilateral effusions with bibasilar atelectasis or infiltrates.       Past Medical History  Diagnosis Date  . Allergic rhinitis   . Anxiety   . Depression   . GERD (gastroesophageal reflux disease)   . Hypertension   . Osteopenia   . Bradycardia   . Hyperlipidemia   . Duodenal ulcer   . Other specified cardiac  dysrhythmias(427.89)   . Diverticulosis   . GI bleed   . Atrial fibrillation (HCC)   . Anemia associated with acute blood loss   . Diverticulosis   . Shortness of breath dyspnea     with exertion  . Presence of permanent cardiac pacemaker       Most Recent Cardiac Studies: As above   Surgical History:  Past Surgical History  Procedure Laterality Date  . Appendectomy    . Total abdominal hysterectomy    . Tonsillectomy    . Ankle fracture surgery Right 05/2002  . Insert / replace / remove pacemaker    . Cardiac defibrillator placement    . Esophagogastroduodenoscopy N/A 06/24/2013    Procedure: ESOPHAGOGASTRODUODENOSCOPY (EGD);  Surgeon: Hilarie Fredrickson, MD;  Location: [facility];  Service: Endoscopy;  Laterality: N/A;  . Colonoscopy N/A 06/25/2013    Procedure: COLONOSCOPY;  Surgeon: Hilarie Fredrickson, MD;  Location: [facility];  Service: Endoscopy;  Laterality: N/A;  . Total hip arthroplasty Left 10/11/2014    DR MURPHY  . Total hip arthroplasty Left 10/11/2014    Procedure: TOTAL HIP ARTHROPLASTY ANTERIOR APPROACH;  Surgeon: Sheral Apley, MD;  Location: [facility] OR;  Service: Orthopedics;  Laterality: Left;     Home Meds: Prior to Admission medications   Medication Sig Start Date End Date Taking? Authorizing Provider  amiodarone (PACERONE) 400 MG tablet Take 1 tablet (400 mg total) by mouth 2 (two) times daily. 09/12/15  Yes Duke Salvia,  MD  apixaban (ELIQUIS) 2.5 MG TABS tablet Take 1 tablet (2.5 mg total) by mouth 2 (two) times daily. 09/12/15  Yes Duke Salvia, MD  Cholecalciferol (VITAMIN D3) 2000 UNITS TABS Take 1 capsule by mouth daily.   Yes Historical Provider, MD  [facility] SENNA 8.6 MG tablet TAKE 2 TABLETS BY MOUTH TWICE A DAY AT 8AM AND ALSP AT 5PM 12/19/14  Yes Marne A Tower, MD  donepezil (ARICEPT) 10 MG tablet Take 1 tablet by mouth daily. 03/21/15  Yes Historical Provider, MD  furosemide (LASIX) 40 MG tablet Take one tablet (40 mg) by mouth x 7 days as directed 09/12/15   Yes Duke Salvia, MD  memantine (NAMENDA) 5 MG tablet Take 5 mg by mouth 2 (two) times daily. 05/10/15  Yes Historical Provider, MD  metoprolol tartrate (LOPRESSOR) 25 MG tablet Take 1 tablet (25 mg total) by mouth 2 (two) times daily. 11/30/14  Yes Judy Pimple, MD  mirtazapine (REMERON) 15 MG tablet Take 1 tablet (15 mg total) by mouth at bedtime. 08/31/15  Yes Judy Pimple, MD  Multiple Vitamin (MULTIVITAMIN) tablet Take 1 tablet by mouth daily.     Yes Historical Provider, MD  omeprazole (PRILOSEC) 20 MG capsule TAKE 1 CAPSULE EVERY DAY 11/30/14  Yes Judy Pimple, MD  sertraline (ZOLOFT) 100 MG tablet Take 1 tablet (100 mg total) by mouth daily. 08/02/14  Yes Judy Pimple, MD  SUPER B COMPLEX/C PO Take 1 capsule by mouth daily.   Yes Historical Provider, MD  doxycycline (VIBRA-TABS) 100 MG tablet Take 1 tablet (100 mg total) by mouth 2 (two) times daily. 08/31/15   Judy Pimple, MD    Inpatient Medications:  . amiodarone  150 mg Intravenous Once  . apixaban  2.5 mg Oral BID  . butamben-tetracaine-benzocaine      . cholecalciferol  2,000 Units Oral Daily  . donepezil  10 mg Oral Daily  . furosemide  20 mg Intravenous 3 times per day  . memantine  5 mg Oral BID  . metoprolol tartrate  25 mg Oral BID  . mirtazapine  15 mg Oral QHS  . multivitamin with minerals  1 tablet Oral Daily  . pantoprazole  40 mg Oral Daily  . senna  1 tablet Oral QHS  . sertraline  100 mg Oral Daily   . amiodarone     Followed by  . amiodarone      Allergies:  Allergies  Allergen Reactions  . Ace Inhibitors     REACTION: cough  . Alendronate Sodium     REACTION: aches  . Avapro [Irbesartan]     hypotension  . Codeine     Dizzy and nauseated   . Dabigatran Etexilate Mesylate Diarrhea    indigestion  . Nitrofurantoin     REACTION: rash  . Penicillins     REACTION: rash  . Prednisone Other (See Comments)    Extreme weakness  . Trazodone And Nefazodone Other (See Comments)    Nightmares      Social History   Social History  . Marital Status: Widowed    Spouse Name: N/A  . Number of Children: N/A  . Years of Education: N/A   Occupational History  . Not on file.   Social History Main Topics  . Smoking status: Never Smoker   . Smokeless tobacco: Never Used  . Alcohol Use: No     Comment: infrequently-rare  . Drug Use: No  . Sexual Activity: Not  on file   Other Topics Concern  . Not on file   Social History Narrative     Family History  Problem Relation Age of Onset  . Heart attack Father   . Depression Mother   . Alzheimer's disease Mother   . Pneumonia Mother   . Heart disease Brother     hx of CABG     Review of Systems: Review of Systems  Constitutional: Positive for weight loss and malaise/fatigue. Negative for fever, chills and diaphoresis.  HENT: Negative for congestion.   Eyes: Negative for discharge and redness.  Respiratory: Positive for shortness of breath. Negative for cough, hemoptysis, sputum production and wheezing.   Cardiovascular: Positive for palpitations, orthopnea, leg swelling and PND. Negative for chest pain and claudication.  Gastrointestinal: Negative for nausea, vomiting and abdominal pain.  Musculoskeletal: Negative for falls.  Skin: Negative for rash.  Neurological: Positive for weakness. Negative for dizziness, tingling, tremors, sensory change, speech change and loss of consciousness.  Endo/Heme/Allergies: Does not bruise/bleed easily.  Psychiatric/Behavioral: The patient is not nervous/anxious.   All other systems reviewed and are negative.   Labs: No results for input(s): CKTOTAL, CKMB, TROPONINI in the last 72 hours. Lab Results  Component Value Date   WBC 6.9 09/12/2015   HGB 13.7 03/15/2015   HCT 42.4 09/12/2015   MCV 93 09/12/2015   PLT 291 09/12/2015    Recent Labs Lab 09/12/15 1046  NA 143  K 4.1  CL 100  CO2 29  BUN 25  CREATININE 1.16*  CALCIUM 8.8  GLUCOSE 96   Lab Results  Component  Value Date   CHOL 237* 11/30/2014   HDL 54.60 11/30/2014   LDLCALC 160* 11/30/2014   TRIG 113.0 11/30/2014   No results found for: DDIMER  Radiology/Studies:  No results found.  EKG: Afib with RVR  Weights: Filed Weights   09/15/15 0710 09/15/15 1039  Weight: 149 lb (67.586 kg) 148 lb 12.8 oz (67.495 kg)     Physical Exam: Blood pressure 128/86, pulse 141, temperature 98.5 F (36.9 C), temperature source Oral, resp. rate 22, height 5\' 5"  (1.651 m), weight 148 lb 12.8 oz (67.495 kg), SpO2 97 %. Body mass index is 24.76 kg/(m^2). General: Well developed, well nourished, in no acute distress. Head: Normocephalic, atraumatic, sclera non-icteric, no xanthomas, nares are without discharge.  Neck: Negative for carotid bruits. JVD elevated. Lungs: Decreased breath sounds bilaterally with crackles along the bilateral bases. Breathing is unlabored. Heart: Tachycardic, irregularly-irregular, with S1 S2. No murmurs, rubs, or gallops appreciated. Abdomen: Soft, non-tender, non-distended with normoactive bowel sounds. No hepatomegaly. No rebound/guarding. No obvious abdominal masses. Msk:  Strength and tone appear normal for age. Extremities: No clubbing or cyanosis. 2+ pitting edema along bilateral LE.  Distal pedal pulses are 2+ and equal bilaterally. Neuro: Alert and oriented X 3. No facial asymmetry. No focal deficit. Moves all extremities spontaneously. Psych:  Responds to questions appropriately with a normal affect.    Assessment and Plan:   1. PAF with RVR: -She remains with tachycardic rate in the 120's -Start amiodarone gtt with bolus at this time in an effort to rate and rhythm control her, though suspect she will need further diuresis prior to gaining better rate control at this time -Continue Lopressor 25 mg bid, she has been hypotensive at times with SBP in the 90's which precludes further titration currently -Eliquis 2.5 mg bid (age of 28 indicates this dose and her dry  weight is <60 kg making this  the correct dose) -CHADS2VASc 5 (CHF, HTN, age x 2, female)  2. Acute respiratory distress with acute decompensated heart failure: -Likely multifactorial including decompensated HF and Afib with RVR -IV Lasix 20 mg q 8 hours -Lopressor 25 mg bid -Wean O2 as able  3. Tachybrady syndrome: -Pacer appears to be functioning properly -Followed by EP   Signed, Eula Listen, PA-C Pager: [phone] 09/15/2015, 12:00 PM

## 2015-09-15 NOTE — Progress Notes (Signed)
Skin checked by Mya Woodard RN 

## 2015-09-15 NOTE — Progress Notes (Signed)
MD Allena Katz was notified of anxiety. Xanax 0.25mg  PO once was ordered.

## 2015-09-15 NOTE — Transfer of Care (Signed)
Immediate Anesthesia Transfer of Care Note  Patient: [name] [name]  Procedure(s) Performed: Procedure(s): CARDIOVERSION (N/A) TRANSESOPHAGEAL ECHOCARDIOGRAM (TEE) (N/A)  Patient Location: PACU and Cath Lab  Anesthesia Type:General  Level of Consciousness: awake, alert  and oriented  Airway & Oxygen Therapy: Patient Spontanous Breathing and Patient connected to nasal cannula oxygen  Post-op Assessment: Report given to RN and Post -op Vital signs reviewed and stable  Post vital signs: Reviewed and stable  Last Vitals:  Filed Vitals:   09/15/15 0815 09/15/15 0818  BP: 94/77 92/57  Pulse: 34   Temp:    Resp: 18     Complications: No apparent anesthesia complications

## 2015-09-15 NOTE — Interval H&P Note (Signed)
History and Physical Interval Note:  09/15/2015 9:24 AM  [name] [name]  has presented today for surgery, with the diagnosis of Cardioversion and TEE        AFIB  The various methods of treatment have been discussed with the patient and family. After consideration of risks, benefits and other options for treatment, the patient has consented to  Procedure(s): CARDIOVERSION (N/A) TRANSESOPHAGEAL ECHOCARDIOGRAM (TEE) (N/A) as a surgical intervention .  The patient's history has been reviewed, patient examined, no change in status, stable for surgery.  I have reviewed the patient's chart and labs.  Questions were answered to the patient's satisfaction.     Lorine Bears

## 2015-09-15 NOTE — H&P (Signed)
[facility] at [facility]   PATIENT NAME: [name] [name]    MR#:  [id number]  DATE OF BIRTH:  [birth year]  DATE OF ADMISSION:  09/15/2015  PRIMARY CARE PHYSICIAN: Roxy Manns, MD   REQUESTING/REFERRING PHYSICIAN: Dr Kirke Corin  CHIEF COMPLAINT:  Increasing shortness of breath leg edema for several days  HISTORY OF PRESENT ILLNESS:  [name] [name]  is a 81 y.o. female with a known history of dementia, tachybradycardia syndrome status post pacemaker placement, hypertension, history of chronic atrial fibrillation on anticoagulation with eliquis, history of GI bleed in the past underwent elective cardioversion this morning by Dr.arida Patient received cardioversion with 120 J after she was given anesthesia. Post procedure patient remained hypoxic with sats anywhere from 85-90% on room air  It was felt the patient was in congestive heart failure hence patient is being admitted for further evaluation and management. Her echo in the remote past showed EF of 55-60%. Echo 2 days pending Per family patient has been having LEG swelling/edema and increasing shortness of breath. She does not use any home oxygen. She lives alone next door to her son's house PAST MEDICAL HISTORY:   Past Medical History  Diagnosis Date  . Allergic rhinitis   . Anxiety   . Depression   . GERD (gastroesophageal reflux disease)   . Hypertension   . Osteopenia   . Bradycardia   . Hyperlipidemia   . Duodenal ulcer   . Other specified cardiac dysrhythmias(427.89)   . Diverticulosis   . GI bleed   . Atrial fibrillation (HCC)   . Anemia associated with acute blood loss   . Diverticulosis   . Shortness of breath dyspnea     with exertion  . Presence of permanent cardiac pacemaker     PAST SURGICAL HISTOIRY:   Past Surgical History  Procedure Laterality Date  . Appendectomy    . Total abdominal hysterectomy    . Tonsillectomy    . Ankle fracture surgery Right 05/2002   . Insert / replace / remove pacemaker    . Cardiac defibrillator placement    . Esophagogastroduodenoscopy N/A 06/24/2013    Procedure: ESOPHAGOGASTRODUODENOSCOPY (EGD);  Surgeon: Hilarie Fredrickson, MD;  Location: [facility];  Service: Endoscopy;  Laterality: N/A;  . Colonoscopy N/A 06/25/2013    Procedure: COLONOSCOPY;  Surgeon: Hilarie Fredrickson, MD;  Location: [facility];  Service: Endoscopy;  Laterality: N/A;  . Total hip arthroplasty Left 10/11/2014    DR MURPHY  . Total hip arthroplasty Left 10/11/2014    Procedure: TOTAL HIP ARTHROPLASTY ANTERIOR APPROACH;  Surgeon: Sheral Apley, MD;  Location: [facility];  Service: Orthopedics;  Laterality: Left;  . Electrophysiologic study N/A 09/15/2015    Procedure: CARDIOVERSION;  Surgeon: Iran Ouch, MD;  Location: [facility] ORS;  Service: Cardiovascular;  Laterality: N/A;  . Tee without cardioversion N/A 09/15/2015    Procedure: TRANSESOPHAGEAL ECHOCARDIOGRAM (TEE);  Surgeon: Iran Ouch, MD;  Location: [facility];  Service: Cardiovascular;  Laterality: N/A;    SOCIAL HISTORY:   Social History  Substance Use Topics  . Smoking status: Never Smoker   . Smokeless tobacco: Never Used  . Alcohol Use: No     Comment: infrequently-rare    FAMILY HISTORY:   Family History  Problem Relation Age of Onset  . Heart attack Father   . Depression Mother   . Alzheimer's disease Mother   . Pneumonia Mother   . Heart disease Brother     hx of  CABG    DRUG ALLERGIES:   Allergies  Allergen Reactions  . Ace Inhibitors     REACTION: cough  . Alendronate Sodium     REACTION: aches  . Avapro [Irbesartan]     hypotension  . Codeine     Dizzy and nauseated   . Dabigatran Etexilate Mesylate Diarrhea    indigestion  . Nitrofurantoin     REACTION: rash  . Penicillins     REACTION: rash  . Prednisone Other (See Comments)    Extreme weakness  . Trazodone And Nefazodone Other (See Comments)    Nightmares     REVIEW OF SYSTEMS:  Review of  Systems  Constitutional: Negative for fever, chills and diaphoresis.  HENT: Negative for congestion, ear pain, hearing loss, nosebleeds and sore throat.   Eyes: Negative for blurred vision, double vision, photophobia and pain.  Respiratory: Positive for shortness of breath. Negative for hemoptysis, sputum production, wheezing and stridor.   Cardiovascular: Positive for orthopnea, leg swelling and PND. Negative for claudication.  Gastrointestinal: Negative for heartburn and abdominal pain.  Genitourinary: Negative for dysuria and frequency.  Musculoskeletal: Negative for back pain, joint pain and neck pain.  Skin: Negative for rash.  Neurological: Positive for weakness. Negative for tingling, sensory change, speech change, focal weakness, seizures and headaches.  Endo/Heme/Allergies: Does not bruise/bleed easily.  Psychiatric/Behavioral: Negative for suicidal ideas, memory loss and substance abuse. The patient is not nervous/anxious.   All other systems reviewed and are negative.    MEDICATIONS AT HOME:   Prior to Admission medications   Medication Sig Start Date End Date Taking? Authorizing Provider  amiodarone (PACERONE) 400 MG tablet Take 1 tablet (400 mg total) by mouth 2 (two) times daily. 09/12/15  Yes Duke Salvia, MD  apixaban (ELIQUIS) 2.5 MG TABS tablet Take 1 tablet (2.5 mg total) by mouth 2 (two) times daily. 09/12/15  Yes Duke Salvia, MD  Cholecalciferol (VITAMIN D3) 2000 UNITS TABS Take 1 capsule by mouth daily.   Yes Historical Provider, MD  [employer] SENNA 8.6 MG tablet TAKE 2 TABLETS BY MOUTH TWICE A DAY AT 8AM AND ALSP AT 5PM 12/19/14  Yes Marne A Tower, MD  donepezil (ARICEPT) 10 MG tablet Take 1 tablet by mouth daily. 03/21/15  Yes Historical Provider, MD  furosemide (LASIX) 40 MG tablet Take one tablet (40 mg) by mouth x 7 days as directed 09/12/15  Yes Duke Salvia, MD  memantine (NAMENDA) 5 MG tablet Take 5 mg by mouth 2 (two) times daily. 05/10/15  Yes Historical Provider,  MD  metoprolol tartrate (LOPRESSOR) 25 MG tablet Take 1 tablet (25 mg total) by mouth 2 (two) times daily. 11/30/14  Yes Judy Pimple, MD  mirtazapine (REMERON) 15 MG tablet Take 1 tablet (15 mg total) by mouth at bedtime. 08/31/15  Yes Judy Pimple, MD  Multiple Vitamin (MULTIVITAMIN) tablet Take 1 tablet by mouth daily.     Yes Historical Provider, MD  omeprazole (PRILOSEC) 20 MG capsule TAKE 1 CAPSULE EVERY DAY 11/30/14  Yes Judy Pimple, MD  sertraline (ZOLOFT) 100 MG tablet Take 1 tablet (100 mg total) by mouth daily. 08/02/14  Yes Judy Pimple, MD  SUPER B COMPLEX/C PO Take 1 capsule by mouth daily.   Yes Historical Provider, MD  doxycycline (VIBRA-TABS) 100 MG tablet Take 1 tablet (100 mg total) by mouth 2 (two) times daily. 08/31/15   Judy Pimple, MD      VITAL SIGNS:  Blood pressure  100/70, pulse 114, temperature 98.5 F (36.9 C), temperature source Oral, resp. rate 18, height  (1.651 m), weight 67.495 kg (148 lb 12.8 oz), SpO2 91 %.  PHYSICAL EXAMINATION:  GENERAL:  82 y.o.-year-old patient lying in the bed with no acute distress.  EYES: Pupils equal, round, reactive to light and accommodation. No scleral icterus. Extraocular muscles intact.  HEENT: Head atraumatic, normocephalic. Oropharynx and nasopharynx clear.  NECK:  Supple, no jugular venous distention. No thyroid enlargement, no tenderness.  LUNGS:  Decreasedbreath sounds bilaterally, no wheezing, basilar rales present, no rhonchi or crepitation. No use of accessory muscles of respiration.  CARDIOVASCULAR: S1, S2  irregularly irregular. No murmurs, rubs, or gallops.  Tachyarrhythmia ABDOMEN: Soft, nontender, nondistended. Bowel sounds present. No organomegaly or mass.  EXTREMITIES:++ pedal edema, no cyanosis, or clubbing.  NEUROLOGIC: Cranial nerves II through XII are intact. Muscle strength 4/5 in all extremities. Sensation intact. Gait not checked.  Appears weak PSYCHIATRIC:  patient is alert . Some confusion due to  underlying dementia SKIN: No obvious rash, lesion, or ulcer.   LABORATORY PANEL:   CBC  Recent Labs Lab 09/12/15 1046 09/15/15 1156  WBC 6.9  --   HGB  --  13.8  HCT 42.4  --   PLT 291  --    ------------------------------------------------------------------------------------------------------------------  Chemistries   Recent Labs Lab 09/15/15 1156  NA 141  K 3.1*  CL 99*  CO2 36*  GLUCOSE 121*  BUN 29*  CREATININE 1.22*  CALCIUM 8.6*  MG 1.7   ------------------------------------------------------------------------------------------------------------------  Cardiac Enzymes No results for input(s): TROPONINI in the last 168 hours. ------------------------------------------------------------------------------------------------------------------  RADIOLOGY:  Dg Chest Port 1 View  09/15/2015  CLINICAL DATA:  CHF EXAM: PORTABLE CHEST 1 VIEW COMPARISON:  09/11/2011 FINDINGS: Left pacer remains in place, unchanged. Cardiomegaly. Vascular congestion. Small bilateral pleural effusions with bibasilar atelectasis or infiltrates. No acute bony abnormality. IMPRESSION: Cardiomegaly with vascular congestion. Small bilateral pleural effusions with bibasilar atelectasis or infiltrates. Electronically Signed   By: Charlett Nose M.D.   On: 09/15/2015 12:07    EKG:  A. fib with RVR on telemetry monitoring  IMPRESSION AND PLAN:   [name] [name]  is a 82 y.o. female with a known history of dementia, tachybradycardia syndrome status post pacemaker placement, hypertension, history of chronic atrial fibrillation on anticoagulation with eliquis, history of GI bleed in the past underwent elective cardioversion this morning by Dr.arida Patient received cardioversion with 120 J after she was given anesthesia. Post procedure patient remained hypoxic with sats anywhere from 85-90% on room air  1. acute congestive heart failure. EF unknown. Echo pending -Admit to telemetry -IV Lasix 20 mg  twice a day, monitor I's and O's, daily weight -Echo -Check mag and TSH  2. Acute on chronic atrial fibrillation. Patient is status post cardioversion this morning -Resume amiodarone, metoprolol -We'll resume apixaban for chronic anticoagulation  3. Severe dementia Continue Aricept, Namenda, Remeron and Seroquel  4. Hypokalemia replete  5. Acute on chronic kidney disease stage III Baseline creatinine 1.1 -Creatinine today is 1.2 to -Avoid nephrotoxins   All the records are reviewed and case discussed with ED provider. Management plans discussed with the patient, family (sonAnd Daughter-In-Law)  and they are in agreement.  CODE STATUS: DNR (per her son)  TOTAL TIME TAKING CARE OF THIS PATIENT: 45 minutes.    [name] [name] M.D on 09/15/2015 at 3:30 PM  Between 7am to 6pm - Pager - [phone]  After 6pm go to [email or web address] - password [email or web address]  [facility]  Office  [phone]  CC: Primary care physician; Roxy Manns, MD

## 2015-09-15 NOTE — Progress Notes (Signed)
Ryan PA was notified that after bolus bp dropped to 95/72 with recheck of 100/70. Order was to go to half dose 16.6 ml hr. Continue to monitor.

## 2015-09-15 NOTE — H&P (View-Only) (Signed)
Patient Care Team: Judy Pimple, MD as PCP - General   HPI  [name] [name] is a 82 y.o. female Seen in followup for pacemaker implantation for tachybradycardia syndrome. She has a history of paroxysmal atrial fibrillation. She has been intolerant of multiple anticoagulants; She has had prior hospitalization for GI bleeding. Anticoagulation was discontinued. This note was reviewed. Dr. Marina Goodell had done endoscopic evaluation. She had AVMs that were treated. She is intercurrently been treated with low-dose aspirin.  She comes in now with progressive weakness and swelling. This is been progressive over recent months. It is been associated with increasing exercise intolerance and fatigue . Most recently she was started on antibiotic.  She has not used remote monitoring.  Her major problem now is hip pain.this has caused her some concern regarding balance and stability. She is requesting a walker.   Past Medical History  Diagnosis Date  . Allergic rhinitis   . Anxiety   . Depression   . GERD (gastroesophageal reflux disease)   . Hypertension   . Osteopenia   . Bradycardia   . Hyperlipidemia   . Duodenal ulcer   . Other specified cardiac dysrhythmias(427.89)   . Diverticulosis   . GI bleed   . Atrial fibrillation (HCC)   . Anemia associated with acute blood loss   . Diverticulosis   . Shortness of breath dyspnea     with exertion  . Presence of permanent cardiac pacemaker     Past Surgical History  Procedure Laterality Date  . Appendectomy    . Total abdominal hysterectomy    . Tonsillectomy    . Ankle fracture surgery Right 05/2002  . Insert / replace / remove pacemaker    . Cardiac defibrillator placement    . Esophagogastroduodenoscopy N/A 06/24/2013    Procedure: ESOPHAGOGASTRODUODENOSCOPY (EGD);  Surgeon: Hilarie Fredrickson, MD;  Location: [facility];  Service: Endoscopy;  Laterality: N/A;  . Colonoscopy N/A 06/25/2013    Procedure: COLONOSCOPY;  Surgeon: Hilarie Fredrickson, MD;  Location: [facility];  Service: Endoscopy;  Laterality: N/A;  . Total hip arthroplasty Left 10/11/2014    DR MURPHY  . Total hip arthroplasty Left 10/11/2014    Procedure: TOTAL HIP ARTHROPLASTY ANTERIOR APPROACH;  Surgeon: Sheral Apley, MD;  Location: [facility] OR;  Service: Orthopedics;  Laterality: Left;    Current Outpatient Prescriptions  Medication Sig Dispense Refill  . aspirin 81 MG tablet Take 81 mg by mouth daily.    . Cholecalciferol (VITAMIN D3) 2000 UNITS TABS Take 1 capsule by mouth daily.    . CVS SENNA 8.6 MG tablet TAKE 2 TABLETS BY MOUTH TWICE A DAY AT 8AM AND ALSP AT 5PM 120 tablet 11  . donepezil (ARICEPT) 10 MG tablet Take 1 tablet by mouth daily.    Marland Kitchen doxycycline (VIBRA-TABS) 100 MG tablet Take 1 tablet (100 mg total) by mouth 2 (two) times daily. 20 tablet 0  . hydrochlorothiazide (HYDRODIURIL) 25 MG tablet TAKE 1 TABLET BY MOUTH ONCE A DAY 30 tablet 11  . memantine (NAMENDA) 5 MG tablet Take 5 mg by mouth 2 (two) times daily.  3  . metoprolol tartrate (LOPRESSOR) 25 MG tablet Take 1 tablet (25 mg total) by mouth 2 (two) times daily. 180 tablet 3  . mirtazapine (REMERON) 15 MG tablet Take 1 tablet (15 mg total) by mouth at bedtime. 30 tablet 11  . Multiple Vitamin (MULTIVITAMIN) tablet Take 1 tablet by mouth daily.      Marland Kitchen  omeprazole (PRILOSEC) 20 MG capsule TAKE 1 CAPSULE EVERY DAY 90 capsule 3  . sertraline (ZOLOFT) 100 MG tablet Take 1 tablet (100 mg total) by mouth daily. 90 tablet 3  . SUPER B COMPLEX/C PO Take 1 capsule by mouth daily.     No current facility-administered medications for this visit.    Allergies  Allergen Reactions  . Ace Inhibitors     REACTION: cough  . Alendronate Sodium     REACTION: aches  . Avapro [Irbesartan]     hypotension  . Codeine     Dizzy and nauseated   . Dabigatran Etexilate Mesylate Diarrhea    indigestion  . Nitrofurantoin     REACTION: rash  . Penicillins     REACTION: rash  . Prednisone Other (See  Comments)    Extreme weakness  . Trazodone And Nefazodone Other (See Comments)    Nightmares     Review of Systems negative except from HPI and PMH  Physical Exam Ht  (1.575 m) Device pocket well healed; without hematoma or erythema.  There is no tethering  Well developed and nourished in no acute distress HENT normal Neck supple with 8-10 Clear Device pocket well healed; without hematoma or erythema.  There is no tethering  Regular rate and rhythm, no murmurs or gallops Abd-soft with active BS No Clubbing cyanosis 2+ brawny edema Skin-warm and dry A & Oriented  Grossly normal sensory and motor function  ECG demonstrates atrial fibrillation with a rapid rate at 152 Right axis deviation at 240 which is new but further around to the left   Assessment and plan  Sinus node dysfunction and chronotropic incompetence 100% atrial pacing  Atrial fibrillation sustainered persistent with RVR   Not on anticoagulation secondary to GI bleeding  Hypertension   Acute heart failure? diastolic  Pacemaker Medtronic The patient's device was interrogated.  The information was reviewed. No changes were made in the programming.       She is acutely decompensated heart failure  This may be related to the atrial fib and RVR which seen, by device interrogation to at least go back to early November and perhaps all the way back to August. With her relatively low blood pressure interval L efforts to slow her heart rate will be a challenge. Because of the persistence of atrial fibrillation and its association with her heart failure, I discussed with the family the role of antiarrhythmic therapy and we have elected to start her on amiodarone. We have discussed side effects.  I wonder whether we will see evidence of related cardiomyopathy at echo. Her last imaging was done in 2009 with normal LV function.  She is severely volume overloaded. We will hold her HCTZ for 7 days and begin her on  furosemide at 20-40 mg a day;  whichever it takes to get her to urinate.  We have discussed extensively the issue of anticoagulation. I reviewed with them the Avveroes data demonstrating equivalence of risk between aspirin and apixaban. We will need to keep track of her hemoglobin not withstanding. We have also discussed the dosing of her apixaban. Her dry weight is measured in November was less than 60 kg. She is significantly volume overloaded. Hence we will use 2.5 mg a day  More than 50% of 45 min was spent in counseling related to the above

## 2015-09-16 ENCOUNTER — Inpatient Hospital Stay: Payer: Commercial Managed Care - HMO

## 2015-09-16 DIAGNOSIS — I4891 Unspecified atrial fibrillation: Secondary | ICD-10-CM

## 2015-09-16 DIAGNOSIS — I480 Paroxysmal atrial fibrillation: Secondary | ICD-10-CM

## 2015-09-16 LAB — RENAL FUNCTION PANEL
ALBUMIN: 2 g/dL — AB (ref 3.5–5.0)
ANION GAP: 4 — AB (ref 5–15)
BUN: 29 mg/dL — ABNORMAL HIGH (ref 6–20)
CALCIUM: 8.7 mg/dL — AB (ref 8.9–10.3)
CO2: 38 mmol/L — AB (ref 22–32)
Chloride: 99 mmol/L — ABNORMAL LOW (ref 101–111)
Creatinine, Ser: 1.42 mg/dL — ABNORMAL HIGH (ref 0.44–1.00)
GFR, EST AFRICAN AMERICAN: 36 mL/min — AB (ref >60)
GFR, EST NON AFRICAN AMERICAN: 31 mL/min — AB (ref >60)
Glucose, Bld: 103 mg/dL — ABNORMAL HIGH (ref 65–99)
PHOSPHORUS: 4.3 mg/dL (ref 2.5–4.6)
Potassium: 3.5 mmol/L (ref 3.5–5.1)
SODIUM: 141 mmol/L (ref 135–145)

## 2015-09-16 LAB — BASIC METABOLIC PANEL
Anion gap: 4 — ABNORMAL LOW (ref 5–15)
BUN: 29 mg/dL — ABNORMAL HIGH (ref 6–20)
CHLORIDE: 99 mmol/L — AB (ref 101–111)
CO2: 39 mmol/L — AB (ref 22–32)
CREATININE: 1.28 mg/dL — AB (ref 0.44–1.00)
Calcium: 8.7 mg/dL — ABNORMAL LOW (ref 8.9–10.3)
GFR calc non Af Amer: 36 mL/min — ABNORMAL LOW (ref >60)
GFR, EST AFRICAN AMERICAN: 41 mL/min — AB (ref >60)
Glucose, Bld: 103 mg/dL — ABNORMAL HIGH (ref 65–99)
POTASSIUM: 3.5 mmol/L (ref 3.5–5.1)
Sodium: 142 mmol/L (ref 135–145)

## 2015-09-16 LAB — MAGNESIUM: Magnesium: 1.6 mg/dL — ABNORMAL LOW (ref 1.7–2.4)

## 2015-09-16 MED ORDER — SODIUM CHLORIDE 0.9 % IJ SOLN
3.0000 mL | Freq: Two times a day (BID) | INTRAMUSCULAR | Status: DC
  Administered 2015-09-16 – 2015-09-21 (×10): 3 mL via INTRAVENOUS

## 2015-09-16 MED ORDER — SODIUM CHLORIDE 0.9 % IV SOLN
250.0000 mL | INTRAVENOUS | Status: DC
  Administered 2015-09-18: 09:00:00 via INTRAVENOUS

## 2015-09-16 MED ORDER — HALOPERIDOL LACTATE 5 MG/ML IJ SOLN: 1.0000 mg | Freq: Four times a day (QID) | INTRAMUSCULAR | Status: DC | PRN

## 2015-09-16 MED ORDER — SODIUM CHLORIDE 0.9 % IJ SOLN
3.0000 mL | INTRAMUSCULAR | Status: DC | PRN
  Administered 2015-09-18: 3 mL via INTRAVENOUS
  Filled 2015-09-16: qty 10

## 2015-09-16 MED ORDER — AMIODARONE HCL 200 MG PO TABS
400.0000 mg | ORAL_TABLET | Freq: Two times a day (BID) | ORAL | Status: DC
  Administered 2015-09-17 – 2015-09-19 (×5): 400 mg via ORAL
  Filled 2015-09-16 (×5): qty 2

## 2015-09-16 MED ORDER — FUROSEMIDE 10 MG/ML IJ SOLN
40.0000 mg | Freq: Two times a day (BID) | INTRAMUSCULAR | Status: DC
  Administered 2015-09-16: 40 mg via INTRAVENOUS
  Filled 2015-09-16: qty 4

## 2015-09-16 NOTE — Consult Note (Signed)
MEDICATION RELATED CONSULT NOTE - Follow Up  Pharmacy Consult for Potassium Replacement Indication: Hypokalemia  Allergies  Allergen Reactions  . Ace Inhibitors     REACTION: cough  . Alendronate Sodium     REACTION: aches  . Avapro [Irbesartan]     hypotension  . Codeine     Dizzy and nauseated   . Dabigatran Etexilate Mesylate Diarrhea    indigestion  . Nitrofurantoin     REACTION: rash  . Penicillins     REACTION: rash  . Prednisone Other (See Comments)    Extreme weakness  . Trazodone And Nefazodone Other (See Comments)    Nightmares     Patient Measurements: Height:  (165.1 cm) Weight: 145 lb 9.6 oz (66.044 kg) IBW/kg (Calculated) : 57  Vital Signs: BP: 122/66 mmHg (01/21 0731) Pulse Rate: 68 (01/21 0731) Intake/Output from previous day: 01/20 0701 - 01/21 0700 In: 907 [P.O.:240; I.V.:667] Out: 1450 [Urine:1450] Intake/Output from this shift:    Labs:  Recent Labs  09/15/15 1156 09/16/15 0748  HGB 13.8  --   CREATININE 1.22* 1.42*  1.28*  MG 1.7 1.6*  PHOS  --  4.3  ALBUMIN  --  2.0*   Estimated Creatinine Clearance: 26.3 mL/min (by C-G formula based on Cr of 1.28).  Microbiology: No results found for this or any previous visit (from the past 720 hour(s)).  Medical History: Past Medical History  Diagnosis Date  . Allergic rhinitis   . Anxiety   . Depression   . GERD (gastroesophageal reflux disease)   . Hypertension   . Osteopenia   . Bradycardia   . Hyperlipidemia   . Duodenal ulcer   . Other specified cardiac dysrhythmias(427.89)   . Diverticulosis   . GI bleed   . Atrial fibrillation (HCC)   . Anemia associated with acute blood loss   . Diverticulosis   . Shortness of breath dyspnea     with exertion  . Presence of permanent cardiac pacemaker     Medications:  Scheduled:  . apixaban  2.5 mg Oral BID  . cholecalciferol  2,000 Units Oral Daily  . donepezil  10 mg Oral Daily  . furosemide  20 mg Intravenous 3 times per  day  . memantine  5 mg Oral BID  . metoprolol tartrate  25 mg Oral BID  . mirtazapine  15 mg Oral QHS  . multivitamin with minerals  1 tablet Oral Daily  . pantoprazole  40 mg Oral Daily  . senna  1 tablet Oral QHS  . sertraline  100 mg Oral Daily   Infusions:  . amiodarone 30 mg/hr (09/16/15 0606)    Assessment: VG is a 82yo female admitted with acute congestive heart failure and chronic afib. Pharmacy consulted to replete potassium in this patient.  Plan:  Potassium level = 3.5. No additional supplement ordered at this time.  Will recheck with AM labs.   Stormy Card, RPh Clinical Pharmacist 09/16/2015,9:23 AM

## 2015-09-16 NOTE — Progress Notes (Signed)
SATURATION QUALIFICATIONS: (This note is used to comply with regulatory documentation for home oxygen)  Patient Saturations on Room Air at Rest = 95%  Patient Saturations on Room Air while Ambulating = 82%  Patient Saturations on 2 Liters of oxygen while Ambulating = 91%  Please briefly explain why patient needs home oxygen:

## 2015-09-16 NOTE — Progress Notes (Signed)
PATIENT ID: [name] [name] with persistent atrial fibrillation, tachy-brady syndrome s/p PPM, GIB, here with hypoxic respiratory failure and acute on chronic diastolic heart failure.  INTERVAL HISTORY: [name] [name] underwent DCCV yesterday. She remained in sinus rhythm for approximately one hour and then reverted back to atrial fibrillation.  Since that time she has remained on amiodarone IV.  She has been in and out of atrial fibrillation since that time.    SUBJECTIVE:  [name] [name] states that her breathing feels better this AM.   She denies chest pain or shortness of breah.   PHYSICAL EXAM Filed Vitals:   09/16/15 0136 09/16/15 0408 09/16/15 0731 09/16/15 1232  BP:  114/87 122/66 125/85  Pulse:  77 68 98  Temp:    97.8 F (36.6 C)  TempSrc:    Oral  Resp:  22  18  Height:      Weight:  66.044 kg (145 lb 9.6 oz)    SpO2: 95% 94%  95%   General: Frail, chronically ill-appearing elderly woman lying in bed in NAD. Neck: JVD 2 cm above clavicle at 45 degrees Lungs:  CTAB.  No crackles, rhonchi or wheezes Heart:  Irregularly irregular.  No m/r/g. Nl S1/S2.  Abdomen:  Soft, NT, ND.  +BS Extremities:  WWP.  2+ pitting edema to the upper tibia bilaterally.   LABS: No results found for: TROPONINI Results for orders placed or performed during the hospital encounter of 09/15/15 (from the past 24 hour(s))  Potassium     Status: None   Collection Time: 09/15/15 11:03 PM  Result Value Ref Range   Potassium 3.9 3.5 - 5.1 mmol/L  Basic metabolic panel     Status: Abnormal   Collection Time: 09/16/15  7:48 AM  Result Value Ref Range   Sodium 142 135 - 145 mmol/L   Potassium 3.5 3.5 - 5.1 mmol/L   Chloride 99 (L) 101 - 111 mmol/L   CO2 39 (H) 22 - 32 mmol/L   Glucose, Bld 103 (H) 65 - 99 mg/dL   BUN 29 (H) 6 - 20 mg/dL   Creatinine, Ser 1.61 (H) 0.44 - 1.00 mg/dL   Calcium 8.7 (L) 8.9 - 10.3 mg/dL   GFR calc non Af Amer 36 (L) >60 mL/min   GFR calc Af Amer 41 (L) >60 mL/min   Anion gap 4  (L) 5 - 15  Magnesium     Status: Abnormal   Collection Time: 09/16/15  7:48 AM  Result Value Ref Range   Magnesium 1.6 (L) 1.7 - 2.4 mg/dL  Renal function panel     Status: Abnormal   Collection Time: 09/16/15  7:48 AM  Result Value Ref Range   Sodium 141 135 - 145 mmol/L   Potassium 3.5 3.5 - 5.1 mmol/L   Chloride 99 (L) 101 - 111 mmol/L   CO2 38 (H) 22 - 32 mmol/L   Glucose, Bld 103 (H) 65 - 99 mg/dL   BUN 29 (H) 6 - 20 mg/dL   Creatinine, Ser 0.96 (H) 0.44 - 1.00 mg/dL   Calcium 8.7 (L) 8.9 - 10.3 mg/dL   Phosphorus 4.3 2.5 - 4.6 mg/dL   Albumin 2.0 (L) 3.5 - 5.0 g/dL   GFR calc non Af Amer 31 (L) >60 mL/min   GFR calc Af Amer 36 (L) >60 mL/min   Anion gap 4 (L) 5 - 15    Intake/Output Summary (Last 24 hours) at 09/16/15 1306 Last data filed at 09/16/15 1000  Gross per  24 hour  Intake    587 ml  Output   1050 ml  Net   -463 ml    EKG:  N/a  Telemetry: Atrial fibrillation.  Rates 60s-90s.  ASSESSMENT AND PLAN:  Active Problems:   Atrial fibrillation (HCC)   # Atrial fibrillation with RVR: Failed DCCV.  [name] [name] rates are much better controlled now, though she remains in atrial fibrillation.  By Monday AM she will have completed a 5g load.  Will plan for repeat DCCV on Monday. - Continue amiodarone infusion today. - Tomorrow start amiodarone 400 mg bid - Eliquis 2.5 mg bid - Continue metoprolol   # Acute on chronic diastolic heart failure: [name] [name] continues to be volume overloaded on exam.  Renal function stable to slightly worse today. - Continue lasix 40 mg iv bid.  Goal -1 to 1.5L/day  # Tachy-brady syndrome: S/p PPM.  Houda Brau C. Duke Salvia, MD, [facility] 09/16/2015 1:06 PM

## 2015-09-16 NOTE — Progress Notes (Signed)
High fall risk. Bedpan. Less anxious today. 2 L of oxygen. A fib/ paced. Takes meds ok. A& O but forgetful at times.Amio drip at 16.7. Sitter at the bedside. Pt has no further concerns at this time.

## 2015-09-16 NOTE — Evaluation (Signed)
Physical Therapy Evaluation Patient Details Name: [name] [name] MRN: [id number] DOB: 1923-03-28 Today's Date: 09/16/2015   History of Present Illness  82 yo female with onset of  cardioversion procedure with CHF afterward, has been in a-fib recently.  PMHx:  pacemaker, EF 55-60%, CKD 3, dementia  Clinical Impression  Pt was able to get up to walk with considerable detailed checking of vital signs, and tolerating gait well with respect to mechanical issues.  However, her medical issues are substantial. Her plan continues to be SNF for recovery of strength and endurance due to living at home alone.    Follow Up Recommendations SNF    Equipment Recommendations  Rolling walker with 5" wheels;Other (comment) (if pt does not have a functional walker)    Recommendations for Other Services       Precautions / Restrictions Precautions Precautions: Fall Restrictions Weight Bearing Restrictions: No      Mobility  Bed Mobility Overal bed mobility: Needs Assistance Bed Mobility: Sit to Supine;Supine to Sit     Supine to sit: Mod assist Sit to supine: Mod assist   General bed mobility comments: assist to pull legs and trunk up from bed, returning to bed with swing around from sitting  Transfers Overall transfer level: Needs assistance Equipment used: Rolling walker (2 wheeled);1 person hand held assist Transfers: Sit to/from [employer] Sit to Stand: Mod assist Stand pivot transfers: Min assist;Mod assist (pt has a tendencyt to sit unexpectedly)       General transfer comment: cued hand placemetn  Ambulation/Gait Ambulation/Gait assistance: Min assist;Mod assist Ambulation Distance (Feet): 14 Feet Assistive device: Rolling walker (2 wheeled);1 person hand held assist Gait Pattern/deviations: Step-through pattern;Step-to pattern;Trunk flexed;Narrow base of support;Shuffle;Decreased stride length Gait velocity: controlled by PT Gait velocity  interpretation: Below normal speed for age/gender General Gait Details: cued sequence as she is having to sidestep and for her safety on walker to control pacing  Stairs            Wheelchair Mobility    Modified Rankin (Stroke Patients Only)       Balance Overall balance assessment: Needs assistance Sitting-balance support: Feet supported Sitting balance-Leahy Scale: Fair   Postural control: Posterior lean Standing balance support: Bilateral upper extremity supported Standing balance-Leahy Scale: Poor                               Pertinent Vitals/Pain Pain Assessment: No/denies pain    Home Living Family/patient expects to be discharged to:: Skilled nursing facility                      Prior Function Level of Independence: Independent with assistive device(s)         Comments: used cane after her R hip surgery, now using RW at hospital     Hand Dominance   Dominant Hand: Right    Extremity/Trunk Assessment   Upper Extremity Assessment: Generalized weakness           Lower Extremity Assessment: Generalized weakness      Cervical / Trunk Assessment: Kyphotic  Communication   Communication: No difficulties  Cognition Arousal/Alertness: Awake/alert Behavior During Therapy: WFL for tasks assessed/performed Overall Cognitive Status: Within Functional Limits for tasks assessed       Memory: Decreased short-term memory              General Comments General comments (skin integrity, edema, etc.): Pt demonstrates  dizziness with sitting, did try to move with no supplemental O2, with pregait O2 sat 95%, post 90% then 82% even after replacing O2.      Exercises        Assessment/Plan    PT Assessment Patient needs continued PT services  PT Diagnosis Generalized weakness;Difficulty walking   PT Problem List Decreased strength;Decreased range of motion;Decreased activity tolerance;Decreased balance;Decreased  mobility;Decreased coordination;Decreased knowledge of use of DME;Decreased safety awareness;Cardiopulmonary status limiting activity  PT Treatment Interventions DME instruction;Gait training;Functional mobility training;Therapeutic activities;Therapeutic exercise;Balance training;Neuromuscular re-education;Patient/family education;Cognitive remediation   PT Goals (Current goals can be found in the Care Plan section) Acute Rehab PT Goals Patient Stated Goal: none stated PT Goal Formulation: With patient/family Time For Goal Achievement: 09/30/15 Potential to Achieve Goals: Good    Frequency Min 2X/week   Barriers to discharge        Co-evaluation               End of Session Equipment Utilized During Treatment: Gait belt Activity Tolerance: Patient tolerated treatment well;Treatment limited secondary to medical complications (Comment);Other (comment) (dropped O2 sats) Patient left: in bed;with call bell/phone within reach;with family/visitor present Nurse Communication: Mobility status;Other (comment) (O2 sats with and without O2 for short gati)         Time: 8119-1478 PT Time Calculation (min) (ACUTE ONLY): 24 min   Charges:   PT Evaluation $PT Eval Moderate Complexity: 1 Procedure PT Treatments $Gait Training: 8-22 mins   PT G Codes:        Ivar Drape Sep 30, 2015, 2:35 PM   Samul Dada, PT MS Acute Rehab Dept. Number: [facility] [id number] and [facility] [phone]

## 2015-09-16 NOTE — Progress Notes (Signed)
Pt having slight expiratory wheeze.  Pt says his breathing feels fine- does not want a breathing treatment. Will continue to monitor- oxygen sats in mid-high 90's. Louis Meckel

## 2015-09-16 NOTE — Progress Notes (Signed)
[facility] [place] at [facility]   PATIENT NAME: [name] [name]    MR#:  [id number]  DATE OF BIRTH:  03-13-1923  SUBJECTIVE: I feel good  CHIEF COMPLAINT:  No chief complaint on file.  patient is a 82 year old Caucasian female with past medical history significant for history of atrial fibrillation for which she underwent elective cardioversion with Dr. Kirke Corin presents to the hospital hypoxic and in CHF. She was diuresed approximately 500 cc over the past 24 hours, she feels better, denies any significant shortness of breath, however, remains on oxygen therapy with oxygen saturations of  95% on 3 L of oxygen. Lower extremity swelling seems to be subsiding. Oral intake is good. Denies any pain  Review of Systems  Constitutional: Negative for fever, chills and weight loss.  HENT: Negative for congestion.   Eyes: Negative for blurred vision and double vision.  Respiratory: Positive for cough and shortness of breath. Negative for sputum production and wheezing.   Cardiovascular: Negative for chest pain, palpitations, orthopnea, leg swelling and PND.  Gastrointestinal: Negative for nausea, vomiting, abdominal pain, diarrhea, constipation and blood in stool.  Genitourinary: Negative for dysuria, urgency, frequency and hematuria.  Musculoskeletal: Negative for falls.  Neurological: Negative for dizziness, tremors, focal weakness and headaches.  Endo/Heme/Allergies: Does not bruise/bleed easily.  Psychiatric/Behavioral: Negative for depression. The patient does not have insomnia.     VITAL SIGNS: Blood pressure 125/85, pulse 98, temperature 97.8 F (36.6 C), temperature source Oral, resp. rate 18, height  (1.651 m), weight 66.044 kg (145 lb 9.6 oz), SpO2 95 %.  PHYSICAL EXAMINATION:   GENERAL:  82 y.o.-year-old patient lying in the bed with no acute distress.  EYES: Pupils equal, round, reactive to light and accommodation. No scleral icterus. Extraocular  muscles intact.  HEENT: Head atraumatic, normocephalic. Oropharynx and nasopharynx clear.  NECK:  Supple, no jugular venous distention. No thyroid enlargement, no tenderness.  LUNGS: Normal breath sounds bilaterally, no wheezing, bilateral crepitations anteriorly as well as posteriorly. Not using accessory muscles of respiration.  CARDIOVASCULAR: S1, S2 normal. No murmurs, rubs, or gallops.  ABDOMEN: Soft, nontender, nondistended. Bowel sounds present. No organomegaly or mass.  EXTREMITIES: 2+ lower extremity and pedal edema, no cyanosis, or clubbing.  NEUROLOGIC: Cranial nerves II through XII are intact. Muscle strength 5/5 in all extremities. Sensation intact. Gait not checked.  PSYCHIATRIC: The patient is alert , but demented SKIN: No obvious rash, lesion, or ulcer.   ORDERS/RESULTS REVIEWED:   CBC  Recent Labs Lab 09/12/15 1046 09/15/15 1156  WBC 6.9  --   HGB  --  13.8  HCT 42.4  --   PLT 291  --   MCV 93  --   MCH 30.8  --   MCHC 33.0  --   RDW 15.7*  --   LYMPHSABS 1.8  --   EOSABS 0.1  --   BASOSABS 0.0  --    ------------------------------------------------------------------------------------------------------------------  Chemistries   Recent Labs Lab 09/12/15 1046 09/15/15 1156 09/15/15 2303 09/16/15 0748  NA 143 141  --  141  142  K 4.1 3.1* 3.9 3.5  3.5  CL 100 99*  --  99*  99*  CO2 29 36*  --  38*  39*  GLUCOSE 96 121*  --  103*  103*  BUN 25 29*  --  29*  29*  CREATININE 1.16* 1.22*  --  1.42*  1.28*  CALCIUM 8.8 8.6*  --  8.7*  8.7*  MG  --  1.7  --  1.6*   ------------------------------------------------------------------------------------------------------------------ estimated creatinine clearance is 26.3 mL/min (by C-G formula based on Cr of 1.28). ------------------------------------------------------------------------------------------------------------------ No results for input(s): TSH, T4TOTAL, T3FREE, THYROIDAB in the last 72  hours.  Invalid input(s): FREET3  Cardiac Enzymes No results for input(s): CKMB, TROPONINI, MYOGLOBIN in the last 168 hours.  Invalid input(s): CK ------------------------------------------------------------------------------------------------------------------ Invalid input(s): POCBNP ---------------------------------------------------------------------------------------------------------------  RADIOLOGY: US Venous Img Lower Bilateral  09/16/2015  CLINICAL DATA:  82 year old female with bilateral lower extremity swelling and edema as well as shortness of breath EXAM: BILATERAL LOWER EXTREMITY VENOUS DOPPLER ULTRASOUND TECHNIQUE: Gray-scale sonography with graded compression, as well as color Doppler and duplex ultrasound were performed to evaluate the lower extremity deep venous systems from the level of the common femoral vein and including the common femoral, femoral, profunda femoral, popliteal and calf veins including the posterior tibial, peroneal and gastrocnemius veins when visible. The superficial great saphenous vein was also interrogated. Spectral Doppler was utilized to evaluate flow at rest and with distal augmentation maneuvers in the common femoral, femoral and popliteal veins. COMPARISON:  None. FINDINGS: RIGHT LOWER EXTREMITY Common Femoral Vein: No evidence of thrombus. Normal compressibility, respiratory phasicity and response to augmentation. Saphenofemoral Junction: No evidence of thrombus. Normal compressibility and flow on color Doppler imaging. Profunda Femoral Vein: No evidence of thrombus. Normal compressibility and flow on color Doppler imaging. Femoral Vein: No evidence of thrombus. Normal compressibility, respiratory phasicity and response to augmentation. Popliteal Vein: No evidence of thrombus. Normal compressibility, respiratory phasicity and response to augmentation. Calf Veins: No evidence of thrombus. Normal compressibility and flow on color Doppler imaging.  Superficial Great Saphenous Vein: No evidence of thrombus. Normal compressibility and flow on color Doppler imaging. Venous Reflux:  None. Other Findings:  None. LEFT LOWER EXTREMITY Common Femoral Vein: No evidence of thrombus. Normal compressibility, respiratory phasicity and response to augmentation. Saphenofemoral Junction: No evidence of thrombus. Normal compressibility and flow on color Doppler imaging. Profunda Femoral Vein: No evidence of thrombus. Normal compressibility and flow on color Doppler imaging. Femoral Vein: No evidence of thrombus. Normal compressibility, respiratory phasicity and response to augmentation. Popliteal Vein: No evidence of thrombus. Normal compressibility, respiratory phasicity and response to augmentation. Calf Veins: No evidence of thrombus. Normal compressibility and flow on color Doppler imaging. Superficial Great Saphenous Vein: No evidence of thrombus. Normal compressibility and flow on color Doppler imaging. Venous Reflux:  None. Other Findings:  None. IMPRESSION: No evidence of deep venous thrombosis. Electronically Signed   By: Malachy Moan M.D.   On: 09/16/2015 12:28   Dg Chest Port 1 View  09/15/2015  CLINICAL DATA:  CHF EXAM: PORTABLE CHEST 1 VIEW COMPARISON:  09/11/2011 FINDINGS: Left pacer remains in place, unchanged. Cardiomegaly. Vascular congestion. Small bilateral pleural effusions with bibasilar atelectasis or infiltrates. No acute bony abnormality. IMPRESSION: Cardiomegaly with vascular congestion. Small bilateral pleural effusions with bibasilar atelectasis or infiltrates. Electronically Signed   By: Charlett Nose M.D.   On: 09/15/2015 12:07    EKG:  Orders placed or performed during the hospital encounter of 09/15/15  . EKG 12-Lead pre-cardioversion  . EKG 12-Lead  . EKG 12-Lead pre-cardioversion  . EKG 12-Lead    ASSESSMENT AND PLAN:  Active Problems:   Atrial fibrillation (HCC)  #1 atrial fibrillation status post TEE and selected  cardioversion, now on amiodarone IV drip. Patient's heart rate fluctuates between sinus and atrial fibrillation and V paced, awaiting for cardiologist input . Continue amiodarone intravenously #2. Acute on chronic diastolic CHF due to  atrial fibrillation, now on Lasix intravenously, following clinically and weaning off oxygen as tolerated #3. Lower extremity edema, getting Doppler ultrasound, results just received revealed no evidence of DVT #4. Renal insufficiency, chronic, follow with diuretics #5. Dementia with behavioral problems. Initiate patient on Haldol as needed, Getting EKG,  following QTc interval #6 generalized weakness, getting physical therapy involved   Management plans discussed with the patient, family and they are in agreement.   DRUG ALLERGIES:  Allergies  Allergen Reactions  . Ace Inhibitors     REACTION: cough  . Alendronate Sodium     REACTION: aches  . Avapro [Irbesartan]     hypotension  . Codeine     Dizzy and nauseated   . Dabigatran Etexilate Mesylate Diarrhea    indigestion  . Nitrofurantoin     REACTION: rash  . Penicillins     REACTION: rash  . Prednisone Other (See Comments)    Extreme weakness  . Trazodone And Nefazodone Other (See Comments)    Nightmares     CODE STATUS:     Code Status Orders        Start     Ordered   09/15/15 1415  Do not attempt resuscitation (DNR)   Continuous    Question Answer Comment  In the event of cardiac or respiratory ARREST Do not call a "code blue"   In the event of cardiac or respiratory ARREST Do not perform Intubation, CPR, defibrillation or ACLS   In the event of cardiac or respiratory ARREST Use medication by any route, position, wound care, and other measures to relive pain and suffering. May use oxygen, suction and manual treatment of airway obstruction as needed for comfort.      09/15/15 1414    Code Status History    Date Active Date Inactive Code Status Order ID Comments User Context    10/11/2014  1:12 PM 10/12/2014 11:05 PM Full Code [id number]  Sheral Apley, MD Inpatient   06/23/2013  4:37 PM 06/26/2013  7:15 PM Full Code [id number]  Esperanza Sheets, MD Inpatient    Advance Directive Documentation        Most Recent Value   Type of Advance Directive  Healthcare Power of Attorney, Living will   Pre-existing out of facility DNR order (yellow form or pink MOST form)     "MOST" Form in Place?        TOTAL TIME TAKING CARE OF THIS PATIENT: 40 minutes.  This is patient's caregiver extensively   Merrit Friesen M.D on 09/16/2015 at 12:56 PM  Between 7am to 6pm - Pager - [phone]  After 6pm go to [email or web address] - password [email or web address]  [facility]  Office  [phone]  CC: Primary care physician; Roxy Manns, MD

## 2015-09-17 ENCOUNTER — Inpatient Hospital Stay: Payer: Commercial Managed Care - HMO

## 2015-09-17 DIAGNOSIS — R300 Dysuria: Secondary | ICD-10-CM

## 2015-09-17 DIAGNOSIS — I495 Sick sinus syndrome: Secondary | ICD-10-CM

## 2015-09-17 LAB — BASIC METABOLIC PANEL
Anion gap: 6 (ref 5–15)
Anion gap: 5 (ref 5–15)
BUN: 34 mg/dL — AB (ref 6–20)
BUN: 34 mg/dL — AB (ref 6–20)
CHLORIDE: 98 mmol/L — AB (ref 101–111)
CHLORIDE: 100 mmol/L — AB (ref 101–111)
CO2: 37 mmol/L — AB (ref 22–32)
CO2: 34 mmol/L — AB (ref 22–32)
CREATININE: 1.46 mg/dL — AB (ref 0.44–1.00)
CREATININE: 1.5 mg/dL — AB (ref 0.44–1.00)
Calcium: 9 mg/dL (ref 8.9–10.3)
Calcium: 8.6 mg/dL — ABNORMAL LOW (ref 8.9–10.3)
GFR calc Af Amer: 35 mL/min — ABNORMAL LOW (ref >60)
GFR calc Af Amer: 34 mL/min — ABNORMAL LOW (ref >60)
GFR calc non Af Amer: 30 mL/min — ABNORMAL LOW (ref >60)
GFR calc non Af Amer: 29 mL/min — ABNORMAL LOW (ref >60)
Glucose, Bld: 114 mg/dL — ABNORMAL HIGH (ref 65–99)
Glucose, Bld: 109 mg/dL — ABNORMAL HIGH (ref 65–99)
Potassium: 3.6 mmol/L (ref 3.5–5.1)
Potassium: 4.2 mmol/L (ref 3.5–5.1)
Sodium: 141 mmol/L (ref 135–145)
Sodium: 139 mmol/L (ref 135–145)

## 2015-09-17 LAB — URINALYSIS COMPLETE WITH MICROSCOPIC (ARMC ONLY)
SPECIFIC GRAVITY, URINE: 1.022 (ref 1.005–1.030)
Squamous Epithelial / LPF: NONE SEEN

## 2015-09-17 MED ORDER — CEPHALEXIN 250 MG/5ML PO SUSR
500.0000 mg | Freq: Two times a day (BID) | ORAL | Status: DC
  Administered 2015-09-17 – 2015-09-18 (×2): 500 mg via ORAL
  Filled 2015-09-17 (×3): qty 10

## 2015-09-17 MED ORDER — FUROSEMIDE 10 MG/ML IJ SOLN
80.0000 mg | Freq: Two times a day (BID) | INTRAMUSCULAR | Status: DC
  Administered 2015-09-17: 80 mg via INTRAVENOUS
  Filled 2015-09-17: qty 8

## 2015-09-17 NOTE — Progress Notes (Signed)
MD Winona Legato was made aware of bp 93/55 and pt states she does not feel good. Order to hold metoprolol and lasix.

## 2015-09-17 NOTE — Clinical Social Work Placement (Signed)
   CLINICAL SOCIAL WORK PLACEMENT  NOTE  Date:  09/17/2015  Patient Details  Name: [name] [name] MRN: [id number] Date of Birth: 12/19/1922  Clinical Social Work is seeking post-discharge placement for this patient at the Skilled  Nursing Facility level of care (*CSW will initial, date and re-position this form in  chart as items are completed):  Yes   Patient/family provided with [facility] Clinical Social Work Department's list of facilities offering this level of care within the geographic area requested by the patient (or if unable, by the patient's family).  Yes   Patient/family informed of their freedom to choose among providers that offer the needed level of care, that participate in Medicare, Medicaid or managed care program needed by the patient, have an available bed and are willing to accept the patient.  Yes   Patient/family informed of [facility]'s ownership interest in [facility], as well as of the fact that they are under no obligation to receive care at these facilities.  PASRR submitted to EDS on       PASRR number received on       Existing PASRR number confirmed on 09/17/15     FL2 transmitted to all facilities in geographic area requested by pt/family on 09/17/15     FL2 transmitted to all facilities within larger geographic area on       Patient informed that his/her managed care company has contracts with or will negotiate with certain facilities, including the following:            Patient/family informed of bed offers received.  Patient chooses bed at       Physician recommends and patient chooses bed at      Patient to be transferred to   on  .  Patient to be transferred to facility by       Patient family notified on   of transfer.  Name of family member notified:        PHYSICIAN       Additional Comment:    _______________________________________________ Haig Prophet, LCSW 09/17/2015, 4:59 PM

## 2015-09-17 NOTE — Progress Notes (Signed)
[facility] - [place] at [facility]   PATIENT NAME: [name] [name]    MR#:  [id number]  DATE OF BIRTH:  [birth year]  SUBJECTIVE: I feel good  CHIEF COMPLAINT:  No chief complaint on file.  patient is a 82 year old Caucasian female with past medical history significant for history of atrial fibrillation for which she underwent elective cardioversion with Dr. Kirke Corin. She remained in sinus rhythm for about one hour and then converted back into atrial fibrillation, she became short of breath and presented  to the hospital hypoxic and in CHF. She was diuresed approximately 600 cc since admission, she denies feeling better, feels weak. He remains in atrial fibrillation despite amiodarone running intravenously. Now also on amiodarone orally . Denies any pain Review of Systems  Constitutional: Negative for fever, chills and weight loss.  HENT: Negative for congestion.   Eyes: Negative for blurred vision and double vision.  Respiratory: Positive for cough and shortness of breath. Negative for sputum production and wheezing.   Cardiovascular: Negative for chest pain, palpitations, orthopnea, leg swelling and PND.  Gastrointestinal: Negative for nausea, vomiting, abdominal pain, diarrhea, constipation and blood in stool.  Genitourinary: Negative for dysuria, urgency, frequency and hematuria.  Musculoskeletal: Negative for falls.  Neurological: Negative for dizziness, tremors, focal weakness and headaches.  Endo/Heme/Allergies: Does not bruise/bleed easily.  Psychiatric/Behavioral: Negative for depression. The patient does not have insomnia.     VITAL SIGNS: Blood pressure 116/65, pulse 99, temperature 98.2 F (36.8 C), temperature source Oral, resp. rate 18, height  (1.651 m), weight 66.271 kg (146 lb 1.6 oz), SpO2 94 %.  PHYSICAL EXAMINATION:   GENERAL:  82 y.o.-year-old patient lying in the bed with no acute distress.  EYES: Pupils equal, round, reactive to light  and accommodation. No scleral icterus. Extraocular muscles intact.  HEENT: Head atraumatic, normocephalic. Oropharynx and nasopharynx clear.  NECK:  Supple, no jugular venous distention. No thyroid enlargement, no tenderness.  LUNGS: Normal breath sounds bilaterally, no wheezing, bilateral crepitations anteriorly as well as posteriorly. Not using accessory muscles of respiration.  CARDIOVASCULAR: S1, S2 , irregularly irregular. No murmurs, rubs, or gallops.  ABDOMEN: Soft, nontender, nondistended. Bowel sounds present. No organomegaly or mass.  EXTREMITIES: 2+ lower extremity and pedal edema, no cyanosis, or clubbing.  NEUROLOGIC: Cranial nerves II through XII are intact. Muscle strength 5/5 in all extremities. Sensation intact. Gait not checked.  PSYCHIATRIC: The patient is alert , but demented SKIN: No obvious rash, lesion, or ulcer.   ORDERS/RESULTS REVIEWED:   CBC  Recent Labs Lab 09/12/15 1046 09/15/15 1156  WBC 6.9  --   HGB  --  13.8  HCT 42.4  --   PLT 291  --   MCV 93  --   MCH 30.8  --   MCHC 33.0  --   RDW 15.7*  --   LYMPHSABS 1.8  --   EOSABS 0.1  --   BASOSABS 0.0  --    ------------------------------------------------------------------------------------------------------------------  Chemistries   Recent Labs Lab 09/12/15 1046 09/15/15 1156 09/15/15 2303 09/16/15 0748 09/17/15 0451 09/17/15 0747  NA 143 141  --  141  142 141 139  K 4.1 3.1* 3.9 3.5  3.5 3.6 4.2  CL 100 99*  --  99*  99* 98* 100*  CO2 29 36*  --  38*  39* 37* 34*  GLUCOSE 96 121*  --  103*  103* 114* 109*  BUN 25 29*  --  29*  29* 34* 34*  CREATININE 1.16* 1.22*  --  1.42*  1.28* 1.46* 1.50*  CALCIUM 8.8 8.6*  --  8.7*  8.7* 9.0 8.6*  MG  --  1.7  --  1.6*  --   --    ------------------------------------------------------------------------------------------------------------------ estimated creatinine clearance is 22.4 mL/min (by C-G formula based on Cr of  1.5). ------------------------------------------------------------------------------------------------------------------ No results for input(s): TSH, T4TOTAL, T3FREE, THYROIDAB in the last 72 hours.  Invalid input(s): FREET3  Cardiac Enzymes No results for input(s): CKMB, TROPONINI, MYOGLOBIN in the last 168 hours.  Invalid input(s): CK ------------------------------------------------------------------------------------------------------------------ Invalid input(s): POCBNP ---------------------------------------------------------------------------------------------------------------  RADIOLOGY: US Venous Img Lower Bilateral  09/16/2015  CLINICAL DATA:  82 year old female with bilateral lower extremity swelling and edema as well as shortness of breath EXAM: BILATERAL LOWER EXTREMITY VENOUS DOPPLER ULTRASOUND TECHNIQUE: Gray-scale sonography with graded compression, as well as color Doppler and duplex ultrasound were performed to evaluate the lower extremity deep venous systems from the level of the common femoral vein and including the common femoral, femoral, profunda femoral, popliteal and calf veins including the posterior tibial, peroneal and gastrocnemius veins when visible. The superficial great saphenous vein was also interrogated. Spectral Doppler was utilized to evaluate flow at rest and with distal augmentation maneuvers in the common femoral, femoral and popliteal veins. COMPARISON:  None. FINDINGS: RIGHT LOWER EXTREMITY Common Femoral Vein: No evidence of thrombus. Normal compressibility, respiratory phasicity and response to augmentation. Saphenofemoral Junction: No evidence of thrombus. Normal compressibility and flow on color Doppler imaging. Profunda Femoral Vein: No evidence of thrombus. Normal compressibility and flow on color Doppler imaging. Femoral Vein: No evidence of thrombus. Normal compressibility, respiratory phasicity and response to augmentation. Popliteal Vein: No  evidence of thrombus. Normal compressibility, respiratory phasicity and response to augmentation. Calf Veins: No evidence of thrombus. Normal compressibility and flow on color Doppler imaging. Superficial Great Saphenous Vein: No evidence of thrombus. Normal compressibility and flow on color Doppler imaging. Venous Reflux:  None. Other Findings:  None. LEFT LOWER EXTREMITY Common Femoral Vein: No evidence of thrombus. Normal compressibility, respiratory phasicity and response to augmentation. Saphenofemoral Junction: No evidence of thrombus. Normal compressibility and flow on color Doppler imaging. Profunda Femoral Vein: No evidence of thrombus. Normal compressibility and flow on color Doppler imaging. Femoral Vein: No evidence of thrombus. Normal compressibility, respiratory phasicity and response to augmentation. Popliteal Vein: No evidence of thrombus. Normal compressibility, respiratory phasicity and response to augmentation. Calf Veins: No evidence of thrombus. Normal compressibility and flow on color Doppler imaging. Superficial Great Saphenous Vein: No evidence of thrombus. Normal compressibility and flow on color Doppler imaging. Venous Reflux:  None. Other Findings:  None. IMPRESSION: No evidence of deep venous thrombosis. Electronically Signed   By: Malachy Moan M.D.   On: 09/16/2015 12:28   Dg Chest Port 1 View  09/17/2015  CLINICAL DATA:  Hypoxia EXAM: PORTABLE CHEST 1 VIEW COMPARISON:  09/15/2015 FINDINGS: Cardiomegaly again noted. Dual lead cardiac pacemaker is unchanged position. Central mild vascular congestion without convincing pulmonary edema. Small to moderate bilateral pleural effusion. Slight increase in right effusion. Bilateral basilar atelectasis or infiltrate. IMPRESSION: Central mild vascular congestion without convincing pulmonary edema. Small to moderate bilateral pleural effusion. Slight increase in right effusion. Bilateral basilar atelectasis or infiltrate. Electronically  Signed   By: Natasha Mead M.D.   On: 09/17/2015 09:04    EKG:  Orders placed or performed during the hospital encounter of 09/15/15  . EKG 12-Lead pre-cardioversion  . EKG 12-Lead  . EKG 12-Lead pre-cardioversion  . EKG 12-Lead  .  EKG 12-Lead  . EKG 12-Lead    ASSESSMENT AND PLAN:  Active Problems:   Atrial fibrillation (HCC)  #1 atrial fibrillation status post TEE and selective cardioversion, patient was in sinus rhythm for about an hour then converted back into atrial fibrillation, now on amiodarone IV drip and amiodarone orally. Patient's heart rate fluctuates between 90s and 80s, and remains in  atrial fibrillation vs V paced. Continue Eliquis. Awaiting for cardiology decisions #2. Acute on chronic diastolic CHF due to atrial fibrillation, was on Lasix intravenously, but became hypotensive , Lasix as well as metoprolol on hold. Repeat the chest x-ray today revealed vascular congestion without convincing pulmonary edema, bilateral pleural effusions were also seen.  Getting VQ scan to rule out PE #3. Lower extremity edema, Doppler ultrasound revealed no evidence of DVT #4. Renal insufficiency, chronic, stage III,  relatively stable with diuretics #5. Dementia with behavioral problems. Continue patient on Haldol as needed, Getting EKG intermittently,  QTc interval is about 480 ms #6 generalized weakness, physical therapy recommends skilled nursing facility rehabilitation placement, get social work involved   Management plans discussed with the patient, family and they are in agreement.   DRUG ALLERGIES:  Allergies  Allergen Reactions  . Ace Inhibitors     REACTION: cough  . Alendronate Sodium     REACTION: aches  . Avapro [Irbesartan]     hypotension  . Codeine     Dizzy and nauseated   . Dabigatran Etexilate Mesylate Diarrhea    indigestion  . Nitrofurantoin     REACTION: rash  . Penicillins     REACTION: rash  . Prednisone Other (See Comments)    Extreme weakness  .  Trazodone And Nefazodone Other (See Comments)    Nightmares     CODE STATUS:     Code Status Orders        Start     Ordered   09/15/15 1415  Do not attempt resuscitation (DNR)   Continuous    Question Answer Comment  In the event of cardiac or respiratory ARREST Do not call a "code blue"   In the event of cardiac or respiratory ARREST Do not perform Intubation, CPR, defibrillation or ACLS   In the event of cardiac or respiratory ARREST Use medication by any route, position, wound care, and other measures to relive pain and suffering. May use oxygen, suction and manual treatment of airway obstruction as needed for comfort.      09/15/15 1414    Code Status History    Date Active Date Inactive Code Status Order ID Comments User Context   10/11/2014  1:12 PM 10/12/2014 11:05 PM Full Code [id number]  Sheral Apley, MD Inpatient   06/23/2013  4:37 PM 06/26/2013  7:15 PM Full Code [id number]  Esperanza Sheets, MD Inpatient    Advance Directive Documentation        Most Recent Value   Type of Advance Directive  Healthcare Power of Attorney, Living will   Pre-existing out of facility DNR order (yellow form or pink MOST form)     "MOST" Form in Place?        TOTAL TIME TAKING CARE OF THIS PATIENT: 40 minutes.    Katharina Caper M.D on 09/17/2015 at 1:10 PM  Between 7am to 6pm - Pager - [phone]  After 6pm go to [email or web address] - password [email or web address] [facility]  [place] [facility]  Office  [phone]  CC: Primary care physician; Roxy Manns, MD

## 2015-09-17 NOTE — Progress Notes (Signed)
Pt has been very sleepy today. Up to chair and tolerated it well. Takes 1-1. No pain. 2 L of oxygen. A fib. Amio drip was d/c. Pt has no further concerns at this time.

## 2015-09-17 NOTE — Clinical Social Work Note (Signed)
Clinical Social Work Assessment  Patient Details  Name: [name] [name] MRN: [id number] Date of Birth: 1923-03-22  Date of referral:  09/17/15               Reason for consult:  Facility Placement                Permission sought to share information with:  Chartered certified accountant granted to share information::  Yes, Verbal Permission Granted  Name::      [place]::   [place]   Relationship::     Contact Information:     Housing/Transportation Living arrangements for the past 2 months:  Grant of Information:  Adult Children Patient Interpreter Needed:  None Criminal Activity/Legal Involvement Pertinent to Current Situation/Hospitalization:  No - Comment as needed Significant Relationships:  Adult Children Lives with:  Self Do you feel safe going back to the place where you live?  Yes Need for family participation in patient care:  Yes (Comment)  Care giving concerns:  Patient lives in [place] alone, however her son [name] [name] [phone] lives next door to her.    Social Worker assessment / plan:  Holiday representative (CSW) received SNF consult. PT is recommending SNF. CSW met with patient and her son [name] [name] was at bedside. CSW introduced self and explained role of CSW department. Patient was sitting up in the bed and answered questions appropriately. Per son he lives next door to patient. Son reported that patient's daughter [name] [name] [phone] also provides support and they share HPOA. CSW explained that PT is recommending SNF. Per son patient had a knee replacement in Feb. 2016 at [facility] and went to [facility]. Patient and son are agreeable to SNF search and prefer [facility] CSW explained that patient's insurance [facility] requires authorization for SNF.   FL2 complete and faxed out.   Employment status:  Disabled (Comment on whether or not currently receiving Disability),  Retired Nurse, adult PT Recommendations:  [place] / Referral to community resources:  [place]  Patient/Family's Response to care:  Patient and son are agreeable to SNF search in [place].   Patient/Family's Understanding of and Emotional Response to Diagnosis, Current Treatment, and Prognosis:  Patient and son were pleasant throughout assessment.   Emotional Assessment Appearance:  Appears stated age Attitude/Demeanor/Rapport:    Affect (typically observed):  Accepting, Adaptable, Pleasant Orientation:  Oriented to Self, Fluctuating Orientation (Suspected and/or reported Sundowners) Alcohol / Substance use:  Not Applicable Psych involvement (Current and /or in the community):  No (Comment)  Discharge Needs  Concerns to be addressed:  Discharge Planning Concerns Readmission within the last 30 days:  No Current discharge risk:  Dependent with Mobility Barriers to Discharge:  Continued Medical Work up   [name], LCSW 09/17/2015, 5:00 PM

## 2015-09-17 NOTE — Progress Notes (Signed)
PATIENT ID: [id number] with persistent atrial fibrillation, tachy-brady syndrome s/p PPM, GIB, here with hypoxic respiratory failure and acute on chronic diastolic heart failure.  INTERVAL HISTORY: No events overnight.  She remains in atrial fibrillation.  SUBJECTIVE:  [name] is feeling well.  She denies any chest pain or shortness of breath.   PHYSICAL EXAM Filed Vitals:   09/17/15 0002 09/17/15 0552 09/17/15 0730 09/17/15 1238  BP: 92/66 104/72 93/55 116/65  Pulse: 90 79 86 99  Temp: 98.2 F (36.8 C) 98 F (36.7 C)  98.2 F (36.8 C)  TempSrc: Oral Oral  Oral  Resp: Height:      Weight:  66.271 kg (146 lb 1.6 oz)    SpO2: 93% 93%  94%   General: Frail, chronically ill-appearing elderly woman lying in bed in NAD. Neck: JVD 1 cm above clavicle at 45 degrees Lungs:  CTAB.  No crackles, rhonchi or wheezes Heart:  Irregularly irregular.  No m/r/g. Nl S1/S2.  Abdomen:  Soft, NT, ND.  +BS Extremities:  WWP.  2+ pitting edema to the upper tibia bilaterally.   LABS: No results found for: TROPONINI Results for orders placed or performed during the hospital encounter of 09/15/15 (from the past 24 hour(s))  Basic metabolic panel     Status: Abnormal   Collection Time: 09/17/15  4:51 AM  Result Value Ref Range   Sodium 141 135 - 145 mmol/L   Potassium 3.6 3.5 - 5.1 mmol/L   Chloride 98 (L) 101 - 111 mmol/L   CO2 37 (H) 22 - 32 mmol/L   Glucose, Bld 114 (H) 65 - 99 mg/dL   BUN 34 (H) 6 - 20 mg/dL   Creatinine, Ser 1.61 (H) 0.44 - 1.00 mg/dL   Calcium 9.0 8.9 - 09.6 mg/dL   GFR calc non Af Amer 30 (L) >60 mL/min   GFR calc Af Amer 35 (L) >60 mL/min   Anion gap 6 5 - 15  Basic metabolic panel     Status: Abnormal   Collection Time: 09/17/15  7:47 AM  Result Value Ref Range   Sodium 139 135 - 145 mmol/L   Potassium 4.2 3.5 - 5.1 mmol/L   Chloride 100 (L) 101 - 111 mmol/L   CO2 34 (H) 22 - 32 mmol/L   Glucose, Bld 109 (H) 65 - 99 mg/dL   BUN 34 (H) 6 - 20 mg/dL   Creatinine, Ser 0.45 (H) 0.44 - 1.00 mg/dL   Calcium 8.6 (L) 8.9 - 10.3 mg/dL   GFR calc non Af Amer 29 (L) >60 mL/min   GFR calc Af Amer 34 (L) >60 mL/min   Anion gap 5 5 - 15    Intake/Output Summary (Last 24 hours) at 09/17/15 1314 Last data filed at 09/17/15 1004  Gross per 24 hour  Intake    360 ml  Output    460 ml  Net   -100 ml    EKG:  N/a  Telemetry: Atrial fibrillation.  Rates 60s-90s.  ASSESSMENT AND PLAN:  Active Problems:   Atrial fibrillation (HCC)   # Atrial fibrillation with RVR: Failed DCCV.  [name] rates are much better controlled now, though she remains in atrial fibrillation.  By Monday AM she will have completed a 5g load.  Will plan for repeat DCCV on Monday. - Amiodarone infusion discontinued today - Started amiodarone 400 mg bid.  Plan to switch to 200 mg daily on 09/19/15, as 5g load will  be completed after her  dose tomorrow AM - Eliquis 2.5 mg bid - Continue metoprolol  - DCCV tomorrow  # Acute on chronic diastolic heart failure: [name] continues to be volume overloaded on exam.  Renal function continues to decline.  It is unclear whether in/outs are being accurately collected.  It appears that she only had  out yesterday.  She has not lost any weight since admission.   - Increase lasix to  IV bid Goal -1 to 1.5L/day - Will order Ted hose  # Tachy-brady syndrome: S/p PPM.  # Dark urine: [name] daughter-in-law notes that her urine is dark and foul-smelling.  Will obtain a urinalysis.  Emmajo Bennette C. Duke Salvia, MD, [facility] 09/17/2015 1:14 PM

## 2015-09-17 NOTE — Consult Note (Signed)
MEDICATION RELATED CONSULT NOTE - Follow Up  Pharmacy Consult for Potassium Replacement Indication: Hypokalemia  Allergies  Allergen Reactions  . Ace Inhibitors     REACTION: cough  . Alendronate Sodium     REACTION: aches  . Avapro [Irbesartan]     hypotension  . Codeine     Dizzy and nauseated   . Dabigatran Etexilate Mesylate Diarrhea    indigestion  . Nitrofurantoin     REACTION: rash  . Penicillins     REACTION: rash  . Prednisone Other (See Comments)    Extreme weakness  . Trazodone And Nefazodone Other (See Comments)    Nightmares     Patient Measurements: Height:  (165.1 cm) Weight: 146 lb 1.6 oz (66.271 kg) IBW/kg (Calculated) : 57  Vital Signs: Temp: 98 F (36.7 C) (01/22 0552) Temp Source: Oral (01/22 0552) BP: 93/55 mmHg (01/22 0730) Pulse Rate: 86 (01/22 0730) Intake/Output from previous day: 01/21 0701 - 01/22 0700 In: 360 [P.O.:360] Out: 460 [Urine:460] Intake/Output from this shift: Total I/O In: 120 [P.O.:120] Out: 0   Labs:  Recent Labs  09/15/15 1156 09/16/15 0748 09/17/15 0451 09/17/15 0747  HGB 13.8  --   --   --   CREATININE 1.22* 1.42*  1.28* 1.46* 1.50*  MG 1.7 1.6*  --   --   PHOS  --  4.3  --   --   ALBUMIN  --  2.0*  --   --    Estimated Creatinine Clearance: 22.4 mL/min (by C-G formula based on Cr of 1.5).  Microbiology: No results found for this or any previous visit (from the past 720 hour(s)).  Medical History: Past Medical History  Diagnosis Date  . Allergic rhinitis   . Anxiety   . Depression   . GERD (gastroesophageal reflux disease)   . Hypertension   . Osteopenia   . Bradycardia   . Hyperlipidemia   . Duodenal ulcer   . Other specified cardiac dysrhythmias(427.89)   . Diverticulosis   . GI bleed   . Atrial fibrillation (HCC)   . Anemia associated with acute blood loss   . Diverticulosis   . Shortness of breath dyspnea     with exertion  . Presence of permanent cardiac pacemaker      Medications:  Scheduled:  . amiodarone  400 mg Oral BID  . apixaban  2.5 mg Oral BID  . cholecalciferol  2,000 Units Oral Daily  . donepezil  10 mg Oral Daily  . furosemide  40 mg Intravenous BID  . memantine  5 mg Oral BID  . metoprolol tartrate  25 mg Oral BID  . mirtazapine  15 mg Oral QHS  . multivitamin with minerals  1 tablet Oral Daily  . pantoprazole  40 mg Oral Daily  . senna  1 tablet Oral QHS  . sertraline  100 mg Oral Daily  . sodium chloride  3 mL Intravenous Q12H   Infusions:  . sodium chloride      Assessment: [name] [name] is a 82yo female admitted with acute congestive heart failure and chronic afib. Pharmacy consulted to replete potassium in this patient.  Plan:  Potassium level = 4.2. No additional supplement ordered at this time.  Will recheck with AM labs.   Stormy Card, RPh Clinical Pharmacist 09/17/2015,10:35 AM

## 2015-09-17 NOTE — NC FL2 (Signed)
[place] MEDICAID FL2 LEVEL OF CARE SCREENING TOOL     IDENTIFICATION  Patient Name: [name] Birthdate: [birth year] Sex: female Admission Date (Current Location): 09/15/2015  [place] and [place] Number:  Chiropodist and Address:  [facility], [address] [place], [place] [address]      Provider Number: [id number]  Attending Physician Name and Address:  Katharina Caper, MD  Relative Name and Phone Number:       Current Level of Care: Hospital Recommended Level of Care: Skilled Nursing Facility Prior Approval Number:    Date Approved/Denied:   PASRR Number:  ( [id number] )  Discharge Plan: SNF    Current Diagnoses: Patient Active Problem List   Diagnosis Date Noted  . Persistent atrial fibrillation (HCC) 09/12/2015  . Acute combined systolic and diastolic heart failure (HCC) 09/12/2015  . Urinary incontinence 09/12/2015  . Dyspnea 08/08/2015  . Edema 07/29/2015  . Dementia 05/30/2015  . DJD (degenerative joint disease) 10/11/2014  . Memory loss 10/05/2014  . Fatigue 10/05/2014  . B12 deficiency 10/05/2014  . Preoperative examination 09/13/2014  . Insomnia 08/02/2014  . Hot flashes 08/02/2014  . Malaise and fatigue 04/06/2014  . Rectal bleeding 03/24/2014  . Personal history of arteriovenous malformation (AVM) 03/24/2014  . Lesion of throat 01/18/2014  . Left hip pain 01/10/2014  . Hip osteoarthritis 01/10/2014  . Osteopenia 11/11/2013  . Encounter for Medicare annual wellness exam 11/10/2013  . Hand pain, left 11/10/2013  . Fall due to stumbling 11/01/2013  . Contusion 11/01/2013  . Pacemaker -[employer]tr[employer] 07/20/2013  . Angiodysplasia of colon with hemorrhage 06/25/2013  . Atrial fibrillation (HCC) 06/06/2011  . Anxiety state 06/16/2007  . Adjustment disorder with mixed anxiety and depressed mood 06/16/2007  . Essential hypertension 06/16/2007  . GERD 06/16/2007    Orientation RESPIRATION BLADDER Height &  Weight    Self  O2 (2 Liters Oxygen ) Continent  (165.1 cm) 146 lbs.  BEHAVIORAL SYMPTOMS/MOOD NEUROLOGICAL BOWEL NUTRITION STATUS   (none )  (none ) Continent Diet (NPO to be advanced )  AMBULATORY STATUS COMMUNICATION OF NEEDS Skin   Extensive Assist Verbally Normal                       Personal Care Assistance Level of Assistance  Bathing, Feeding, Dressing Bathing Assistance: Limited assistance Feeding assistance: Independent Dressing Assistance: Limited assistance     Functional Limitations Info  Sight, Hearing, Speech Sight Info: Adequate Hearing Info: Adequate Speech Info: Adequate    SPECIAL CARE FACTORS FREQUENCY  PT (By licensed PT), OT (By licensed OT)     PT Frequency:  (5) OT Frequency:  (5)            Contractures      Additional Factors Info  Code Status, Allergies Code Status Info:  (DNR ) Allergies Info:  (Ace Inhibitors, Alendronate Sodium, Dabigatran Etexilate Mesylate, Nitrofurantoin, Penicillins, Prednisone, Trazodone And Nefazodone, Avapro Irbesartan, Codeine. )           Current Medications (09/17/2015):  This is the current hospital active medication list Current Facility-Administered Medications  Medication Dose Route Frequency Provider Last Rate Last Dose  . 0.9 %  sodium chloride infusion  250 mL Intravenous Continuous Chilton Si, MD      . ALPRAZolam Prudy Feeler) tablet 0.25 mg  0.25 mg Oral TID PRN Oralia Manis, MD   0.25 mg at 09/17/15 0759  . amiodarone (PACERONE) tablet 400 mg  400 mg  Oral BID Chilton Si, MD   400 mg at 09/17/15 0800  . apixaban (ELIQUIS) tablet 2.5 mg  2.5 mg Oral BID Enedina Finner, MD   2.5 mg at 09/17/15 1000  . cholecalciferol (VITAMIN D) tablet 2,000 Units  2,000 Units Oral Daily Enedina Finner, MD   2,000 Units at 09/17/15 0759  . donepezil (ARICEPT) tablet 10 mg  10 mg Oral Daily Enedina Finner, MD   10 mg at 09/17/15 0800  . furosemide (LASIX) injection 80 mg  80 mg Intravenous BID Chilton Si,  MD      . haloperidol lactate (HALDOL) injection 1 mg  1 mg Intravenous Q6H PRN Katharina Caper, MD      . memantine (NAMENDA) tablet 5 mg  5 mg Oral BID Enedina Finner, MD   5 mg at 09/17/15 0800  . metoprolol (LOPRESSOR) injection 5 mg  5 mg Intravenous Q6H PRN Enedina Finner, MD      . metoprolol tartrate (LOPRESSOR) tablet 25 mg  25 mg Oral BID Enedina Finner, MD   Stopped at 09/17/15 1000  . mirtazapine (REMERON) tablet 15 mg  15 mg Oral QHS Enedina Finner, MD   15 mg at 09/16/15 2112  . multivitamin with minerals tablet 1 tablet  1 tablet Oral Daily Enedina Finner, MD   1 tablet at 09/17/15 0759  . pantoprazole (PROTONIX) EC tablet 40 mg  40 mg Oral Daily Enedina Finner, MD   40 mg at 09/17/15 0800  . senna (SENOKOT) tablet 8.6 mg  1 tablet Oral QHS Enedina Finner, MD   8.6 mg at 09/16/15 2112  . sertraline (ZOLOFT) tablet 100 mg  100 mg Oral Daily Enedina Finner, MD   100 mg at 09/17/15 0800  . sodium chloride 0.9 % injection 3 mL  3 mL Intravenous Q12H Chilton Si, MD   3 mL at 09/17/15 1000  . sodium chloride 0.9 % injection 3 mL  3 mL Intravenous PRN Chilton Si, MD         Discharge Medications: Please see discharge summary for a list of discharge medications.  Relevant Imaging Results:  Relevant Lab Results:   Additional Information  (SSN: [id number])  Haig Prophet, LCSW

## 2015-09-17 NOTE — Telephone Encounter (Signed)
Thanks will do

## 2015-09-18 ENCOUNTER — Inpatient Hospital Stay: Payer: Commercial Managed Care - HMO | Admitting: Anesthesiology

## 2015-09-18 ENCOUNTER — Ambulatory Visit: Payer: Commercial Managed Care - HMO

## 2015-09-18 ENCOUNTER — Inpatient Hospital Stay: Payer: Commercial Managed Care - HMO

## 2015-09-18 ENCOUNTER — Encounter
Admission: RE | Admit: 2015-09-18 | Discharge: 2015-09-18 | Disposition: A | Discharge status: Home / Self Care | Payer: Commercial Managed Care - HMO | Source: Ambulatory Visit | Attending: Internal Medicine | Admitting: Internal Medicine

## 2015-09-18 ENCOUNTER — Encounter: Payer: Self-pay | Admitting: *Deleted

## 2015-09-18 ENCOUNTER — Telehealth: Payer: Self-pay | Admitting: Family Medicine

## 2015-09-18 ENCOUNTER — Encounter
Admission: AD | Disposition: A | Discharge status: Hospice | Payer: Self-pay | Source: Ambulatory Visit | Attending: Internal Medicine

## 2015-09-18 DIAGNOSIS — I5033 Acute on chronic diastolic (congestive) heart failure: Secondary | ICD-10-CM

## 2015-09-18 DIAGNOSIS — I4891 Unspecified atrial fibrillation: Secondary | ICD-10-CM

## 2015-09-18 HISTORY — PX: ELECTROPHYSIOLOGIC STUDY: SHX172A

## 2015-09-18 LAB — BASIC METABOLIC PANEL
ANION GAP: 5 (ref 5–15)
BUN: 41 mg/dL — AB (ref 6–20)
CHLORIDE: 97 mmol/L — AB (ref 101–111)
CO2: 38 mmol/L — AB (ref 22–32)
Calcium: 8.8 mg/dL — ABNORMAL LOW (ref 8.9–10.3)
Creatinine, Ser: 1.89 mg/dL — ABNORMAL HIGH (ref 0.44–1.00)
GFR calc Af Amer: 26 mL/min — ABNORMAL LOW (ref >60)
GFR calc non Af Amer: 22 mL/min — ABNORMAL LOW (ref >60)
GLUCOSE: 111 mg/dL — AB (ref 65–99)
POTASSIUM: 3.5 mmol/L (ref 3.5–5.1)
Sodium: 140 mmol/L (ref 135–145)

## 2015-09-18 SURGERY — CARDIOVERSION (CATH LAB)
Anesthesia: Monitor Anesthesia Care

## 2015-09-18 MED ORDER — APIXABAN 2.5 MG PO TABS
2.5000 mg | ORAL_TABLET | Freq: Two times a day (BID) | ORAL | Status: DC
  Administered 2015-09-18 – 2015-09-19 (×2): 2.5 mg via ORAL
  Filled 2015-09-18 (×2): qty 1

## 2015-09-18 MED ORDER — TECHNETIUM TO 99M ALBUMIN AGGREGATED
4.2950 | Freq: Once | INTRAVENOUS | Status: AC | PRN
  Administered 2015-09-18: 4.295 via INTRAVENOUS

## 2015-09-18 MED ORDER — FUROSEMIDE 10 MG/ML IJ SOLN
40.0000 mg | Freq: Two times a day (BID) | INTRAMUSCULAR | Status: DC
  Administered 2015-09-18: 40 mg via INTRAVENOUS
  Filled 2015-09-18: qty 4

## 2015-09-18 MED ORDER — CEPHALEXIN 500 MG PO CAPS
500.0000 mg | ORAL_CAPSULE | Freq: Two times a day (BID) | ORAL | Status: DC
  Administered 2015-09-18 – 2015-09-20 (×3): 500 mg via ORAL
  Filled 2015-09-18 (×6): qty 1

## 2015-09-18 MED ORDER — PROPOFOL 10 MG/ML IV BOLUS
INTRAVENOUS | Status: DC | PRN
  Administered 2015-09-18: 30 mg via INTRAVENOUS

## 2015-09-18 MED ORDER — PHENYLEPHRINE HCL 10 MG/ML IJ SOLN
INTRAMUSCULAR | Status: DC | PRN
  Administered 2015-09-18: 100 ug via INTRAVENOUS

## 2015-09-18 MED ORDER — TECHNETIUM TC 99M DIETHYLENETRIAME-PENTAACETIC ACID
32.3620 | Freq: Once | INTRAVENOUS | Status: AC | PRN
  Administered 2015-09-18: 32.362 via INTRAVENOUS

## 2015-09-18 NOTE — Progress Notes (Signed)
Patient has rested quietly tonight. No complaints of pain and no signs of discomfort or distress noted. Patient did have one complaint of shortness of breath and was up in the chair for part of the night; now is resting in bed. NPO since midnight for procedure scheduled for today; consent in chart. Nursing staff will continue to monitor. Lamonte Richer, RN

## 2015-09-18 NOTE — Anesthesia Preprocedure Evaluation (Signed)
Anesthesia Evaluation  Patient identified by MRN, date of birth, ID band Patient awake    Reviewed: Allergy & Precautions, NPO status   Airway Mallampati: II  TM Distance: >3 FB Neck ROM: Limited    Dental  (+) Teeth Intact   Pulmonary shortness of breath, at rest and Long-Term Oxygen Therapy,     + decreased breath sounds      Cardiovascular Exercise Tolerance: Poor hypertension, Pt. on medications and Pt. on home beta blockers + DVT  + dysrhythmias Atrial Fibrillation + pacemaker + Cardiac Defibrillator  Rhythm:Irregular  Paced, 95 BPM.   Neuro/Psych Anxiety Depression    GI/Hepatic GERD  Medicated and Controlled,  Endo/Other    Renal/GU      Musculoskeletal  (+) Arthritis ,   Abdominal (+)  Abdomen: soft.    Peds  Hematology   Anesthesia Other Findings   Reproductive/Obstetrics                             Anesthesia Physical Anesthesia Plan  ASA: IV  Anesthesia Plan: MAC   Post-op Pain Management:    Induction: Intravenous  Airway Management Planned: Nasal Cannula  Additional Equipment:   Intra-op Plan:   Post-operative Plan:   Informed Consent: I have reviewed the patients History and Physical, chart, labs and discussed the procedure including the risks, benefits and alternatives for the proposed anesthesia with the patient or authorized representative who has indicated his/her understanding and acceptance.     Plan Discussed with: CRNA  Anesthesia Plan Comments: (Very short TIVA (low dose Propofol) for cardioversion.)        Anesthesia Quick Evaluation

## 2015-09-18 NOTE — Progress Notes (Signed)
CSW met with patient and patient's daughter, Juliann Pulse at bedside. CW infomred patient and her daughter of bed offers. Accepted bed offer at [facility].   CSW contacted patient's son Ludwig Clarks by phone and informed him of patient's bed offers. He reports he's in agreement with his sister and patient.   CSW contacted Maudie Mercury, admission coordinator at [facility] to accept bed offer.  CSW will continue to follow and assist.  Ernest Pine, MSW, [place] Work Department [phone]

## 2015-09-18 NOTE — Progress Notes (Signed)
[facility] [place] at [facility]   PATIENT NAME: [name] [name]    MR#:  [id number]  DATE OF BIRTH:  [birth year]  SUBJECTIVE: I feel good  CHIEF COMPLAINT:  No chief complaint on file.  patient is a 82 year old Caucasian female with past medical history significant for history of atrial fibrillation for which she underwent elective cardioversion with Dr. Kirke Corin. She remained in sinus rhythm for about one hour and then converted back into atrial fibrillation, became short of breath and presented  to the hospital hypoxic and in CHF. She was diuresed approximately 900 cc since admission, she denies feeling better, feels weak, short of breath. Patient was loaded with amiodarone and underwent elective cardioversion today again, converted into sinus rhythm and remains in sinus rhythm. Remains very short of breath. Kidney function is worsening on Lasix.     Review of Systems  Constitutional: Negative for fever, chills and weight loss.  HENT: Negative for congestion.   Eyes: Negative for blurred vision and double vision.  Respiratory: Positive for shortness of breath. Negative for cough, sputum production and wheezing.   Cardiovascular: Negative for chest pain, palpitations, orthopnea, leg swelling and PND.  Gastrointestinal: Negative for nausea, vomiting, abdominal pain, diarrhea, constipation and blood in stool.  Genitourinary: Negative for dysuria, urgency, frequency and hematuria.  Musculoskeletal: Negative for falls.  Neurological: Positive for weakness. Negative for dizziness, tremors, focal weakness and headaches.  Endo/Heme/Allergies: Does not bruise/bleed easily.  Psychiatric/Behavioral: Negative for depression. The patient does not have insomnia.     VITAL SIGNS: Blood pressure 113/64, pulse 62, temperature 98.3 F (36.8 C), temperature source Oral, resp. rate 18, height  (1.651 m), weight 68.856 kg (151 lb 12.8 oz), SpO2 92 %.  PHYSICAL EXAMINATION:    GENERAL:  82 y.o.-year-old patient lying in the bed in moderate respiratory distress.  EYES: Pupils equal, round, reactive to light and accommodation. No scleral icterus. Extraocular muscles intact.  HEENT: Head atraumatic, normocephalic. Oropharynx and nasopharynx clear.  NECK:  Supple, no jugular venous distention. No thyroid enlargement, no tenderness.  LUNGS: Diminished breath sounds bilaterally, no wheezing, bilateral crepitations anteriorly as well as posteriorly. Using accessory muscles of respiration.  CARDIOVASCULAR: S1, S2 , regular. No murmurs, rubs, or gallops.  ABDOMEN: Soft, nontender, nondistended. Bowel sounds present. No organomegaly or mass.  EXTREMITIES: Trace to 1+ lower extremity and pedal edema, no cyanosis, or clubbing.  NEUROLOGIC: Cranial nerves II through XII are intact. Muscle strength 5/5 in all extremities. Sensation intact. Gait not checked.  PSYCHIATRIC: The patient is alert , but demented, very uncomfortable today. Dyspneic SKIN: No obvious rash, lesion, or ulcer.   ORDERS/RESULTS REVIEWED:   CBC  Recent Labs Lab 09/12/15 1046 09/15/15 1156  WBC 6.9  --   HGB  --  13.8  HCT 42.4  --   PLT 291  --   MCV 93  --   MCH 30.8  --   MCHC 33.0  --   RDW 15.7*  --   LYMPHSABS 1.8  --   EOSABS 0.1  --   BASOSABS 0.0  --    ------------------------------------------------------------------------------------------------------------------  Chemistries   Recent Labs Lab 09/15/15 1156 09/15/15 2303 09/16/15 0748 09/17/15 0451 09/17/15 0747 09/18/15 0535  NA 141  --  141  142 141 139 140  K 3.1* 3.9 3.5  3.5 3.6 4.2 3.5  CL 99*  --  99*  99* 98* 100* 97*  CO2 36*  --  38*  39* 37* 34*  38*  GLUCOSE 121*  --  103*  103* 114* 109* 111*  BUN 29*  --  29*  29* 34* 34* 41*  CREATININE 1.22*  --  1.42*  1.28* 1.46* 1.50* 1.89*  CALCIUM 8.6*  --  8.7*  8.7* 9.0 8.6* 8.8*  MG 1.7  --  1.6*  --   --   --     ------------------------------------------------------------------------------------------------------------------ estimated creatinine clearance is 19.3 mL/min (by C-G formula based on Cr of 1.89). ------------------------------------------------------------------------------------------------------------------ No results for input(s): TSH, T4TOTAL, T3FREE, THYROIDAB in the last 72 hours.  Invalid input(s): FREET3  Cardiac Enzymes No results for input(s): CKMB, TROPONINI, MYOGLOBIN in the last 168 hours.  Invalid input(s): CK ------------------------------------------------------------------------------------------------------------------ Invalid input(s): POCBNP ---------------------------------------------------------------------------------------------------------------  RADIOLOGY: Dg Chest 1 View  09/18/2015  CLINICAL DATA:  Shortness of breath . EXAM: CHEST 1 VIEW COMPARISON:  09/17/2015. FINDINGS: Cardiac pacer noted with lead tips in right atrium and right ventricle. Cardiomegaly with pulmonary vascular prominence and bilateral pulmonary interstitial prominence bilateral effusions consistent congestive heart failure. IMPRESSION: 1. Cardiac pacer in stable position. 2. Congestive heart failure with bilateral pulmonary edema and bilateral pleural effusions. No change from prior exam. Electronically Signed   By: Maisie Fus  Register   On: 09/18/2015 09:08   Nm Pulmonary Perf And Vent  09/18/2015  CLINICAL DATA:  82 year old with tachycardia and acute CHF. Patient underwent cardioversion for atrial fibrillation earlier today. Personal history of pulmonary embolism and DVT. EXAM: NUCLEAR MEDICINE VENTILATION - PERFUSION LUNG SCAN TECHNIQUE: Ventilation images were obtained in multiple projections using inhaled aerosol Tc-66m DTPA. Perfusion images were obtained in multiple projections after intravenous injection of Tc-43m MAA. RADIOPHARMACEUTICALS:  32.4 Technetium-60m DTPA aerosol inhalation  and 4.3 Technetium-46m MAA IV COMPARISON:  No prior nuclear imaging. Portable chest x-ray performed earlier same date is correlated. FINDINGS: Ventilation: Deposition of aerosol in the central bronchi. Patchy ventilation in both lungs, with diminished ventilation in the left lung relative to the right. Perfusion: Triple matched large segmental perfusion defects involving the lower lung zones bilaterally, correlating with the large bilateral pleural effusions, left greater than right, noted on the chest x-ray same day. IMPRESSION: Intermediate probability of pulmonary embolism based on PIOPED II criteria (triple matched large segmental perfusion defects involving the lower lung zones bilaterally). These defects correspond to the large bilateral pleural effusions, left greater than right on the chest x-ray earlier today. Electronically Signed   By: Hulan Saas M.D.   On: 09/18/2015 14:52   Dg Chest Port 1 View  09/17/2015  CLINICAL DATA:  Hypoxia EXAM: PORTABLE CHEST 1 VIEW COMPARISON:  09/15/2015 FINDINGS: Cardiomegaly again noted. Dual lead cardiac pacemaker is unchanged position. Central mild vascular congestion without convincing pulmonary edema. Small to moderate bilateral pleural effusion. Slight increase in right effusion. Bilateral basilar atelectasis or infiltrate. IMPRESSION: Central mild vascular congestion without convincing pulmonary edema. Small to moderate bilateral pleural effusion. Slight increase in right effusion. Bilateral basilar atelectasis or infiltrate. Electronically Signed   By: Natasha Mead M.D.   On: 09/17/2015 09:04    EKG:  Orders placed or performed during the hospital encounter of 09/15/15  . EKG 12-Lead pre-cardioversion  . EKG 12-Lead  . EKG 12-Lead pre-cardioversion  . EKG 12-Lead  . EKG 12-Lead  . EKG 12-Lead  . EKG 12-Lead  . EKG 12-Lead    ASSESSMENT AND PLAN:  Principal Problem:   Atrial fibrillation with RVR (HCC) Active Problems:   Acute on chronic  diastolic (congestive) heart failure (HCC)  #1 atrial fibrillation  status post TEE and selective cardioversion 2,  patient is in sinus rhythm now, has been loaded with  amiodarone , continue amiodarone orally. Patient's heart rate is in 60s . Marland Kitchen Discontinue Eliquis, initiate heparin intravenously due to concerns of pulmonary embolism . Venous ultrasound was negative for DVT. Appreciate cardiologist input  #2. Acute on chronic diastolic CHF due to atrial fibrillation, continue Lasix  intravenously, but lower doses since patient is hypotensive , metoprolol is on hold. Repeated chest x-ray reveals pulmonary edema, bilateral pleural effusions.  VQ scan intermediate probability for pulmonary embolism  #3. Lower extremity edema, Doppler ultrasound revealed no evidence of DVT #4. Renal insufficiency, chronic, stage III,worsened with hypotension and diuretics #5. Dementia with behavioral problems. No concerns at present. Discontinue Haldol  #6 generalized weakness, physical therapy recommends skilled nursing facility rehabilitation placement, getting social work  involved #7. Pyuria, concerning for urinary tract infection , . Patient is initiated on Keflex orally, urine cultures showed 80,000 colony-forming units of gram-negative rods. ID and susceptibilities to follow   Management plans discussed with the patient, family and they are in agreement.   DRUG ALLERGIES:  Allergies  Allergen Reactions  . Ace Inhibitors     REACTION: cough  . Alendronate Sodium     REACTION: aches  . Avapro [Irbesartan]     hypotension  . Codeine     Dizzy and nauseated   . Dabigatran Etexilate Mesylate Diarrhea    indigestion  . Nitrofurantoin     REACTION: rash  . Penicillins     REACTION: rash  . Prednisone Other (See Comments)    Extreme weakness  . Trazodone And Nefazodone Other (See Comments)    Nightmares     CODE STATUS:     Code Status Orders        Start     Ordered   09/15/15 1415  Do not  attempt resuscitation (DNR)   Continuous    Question Answer Comment  In the event of cardiac or respiratory ARREST Do not call a "code blue"   In the event of cardiac or respiratory ARREST Do not perform Intubation, CPR, defibrillation or ACLS   In the event of cardiac or respiratory ARREST Use medication by any route, position, wound care, and other measures to relive pain and suffering. May use oxygen, suction and manual treatment of airway obstruction as needed for comfort.      09/15/15 1414    Code Status History    Date Active Date Inactive Code Status Order ID Comments User Context   10/11/2014  1:12 PM 10/12/2014 11:05 PM Full Code [id number]  Sheral Apley, MD Inpatient   06/23/2013  4:37 PM 06/26/2013  7:15 PM Full Code [id number]  Esperanza Sheets, MD Inpatient    Advance Directive Documentation        Most Recent Value   Type of Advance Directive  Healthcare Power of Attorney, Living will   Pre-existing out of facility DNR order (yellow form or pink MOST form)     "MOST" Form in Place?        TOTAL TIME TAKING CARE OF THIS PATIENT: 40 minutes.   discussed this patient's daughter   Katharina Caper M.D on 09/18/2015 at 3:21 PM  Between 7am to 6pm - Pager - [phone]  After 6pm go to [email or web address] - password [email or web address] [facility]  [facility]  Office  [phone]  CC: Primary care physician; Roxy Manns, MD

## 2015-09-18 NOTE — Anesthesia Postprocedure Evaluation (Signed)
Anesthesia Post Note  Patient: [name] [name]  Procedure(s) Performed: Procedure(s) (LRB): CARDIOVERSION (N/A)  Patient location during evaluation: PACU Anesthesia Type: General Level of consciousness: awake Pain management: pain level controlled Vital Signs Assessment: post-procedure vital signs reviewed and stable Respiratory status: spontaneous breathing Cardiovascular status: blood pressure returned to baseline Postop Assessment: no headache Anesthetic complications: no    Last Vitals:  Filed Vitals:   09/18/15 0437 09/18/15 0859  BP: 96/66 104/73  Pulse: 82 94  Temp: 36.5 C   Resp: 18 27    Last Pain: There were no vitals filed for this visit.               Jamario Colina M

## 2015-09-18 NOTE — Telephone Encounter (Signed)
[name] [name] pt daughter called to let you know She saw cardiology Serita Grit was in Afib  Tried procedure Friday didn't work  Pt is in hospital @ [facility]  Re did procedure today Pacemaker is ok today Still has Fluid around lung they decreased lasxis due to problems with kidney Pt also has uti  And will be discharged to [facility]

## 2015-09-18 NOTE — Consult Note (Signed)
MEDICATION RELATED CONSULT NOTE - Follow Up  Pharmacy Consult for Potassium Replacement Indication: Hypokalemia  Patient Measurements: Height:  (165.1 cm) Weight: 151 lb 12.8 oz (68.856 kg) IBW/kg (Calculated) : 57  Vital Signs: Temp: 98.3 F (36.8 C) (01/23 1108) Temp Source: Oral (01/23 1108) BP: 113/64 mmHg (01/23 1108) Pulse Rate: 62 (01/23 1108) Intake/Output from previous day: 01/22 0701 - 01/23 0700 In: 240 [P.O.:240] Out: 425 [Urine:425] Intake/Output from this shift: Total I/O In: 100 [I.V.:100] Out: 150 [Urine:150]  Labs:  Recent Labs  09/16/15 0748 09/17/15 0451 09/17/15 0747 09/18/15 0535  CREATININE 1.42*  1.28* 1.46* 1.50* 1.89*  MG 1.6*  --   --   --   PHOS 4.3  --   --   --   ALBUMIN 2.0*  --   --   --    Estimated Creatinine Clearance: 19.3 mL/min (by C-G formula based on Cr of 1.89).  Assessment: VG is a 82yo female admitted with acute congestive heart failure and chronic afib. Pharmacy consulted to replete potassium in this patient.  Potassium this morning is within normal limits at 3.5  Plan:  No additional supplement ordered at this time.  Will recheck with AM labs.   Cindi Carbon, PharmD Clinical Pharmacist 09/18/2015,3:20 PM

## 2015-09-18 NOTE — Progress Notes (Signed)
Dr. Winona Legato aware that pt is back from cardioversion. Ok to give diet if she does not need to be NPO for lung VQ scan (confirmed by Nuc Med). Instructed to hold am metoprolol for current BP, but amio is fine to give.

## 2015-09-18 NOTE — CV Procedure (Signed)
Cardioversion note: A standard informed consent was obtained. Timeout was performed. The pads were placed in the anterior posterior fashion. The patient was given propofol by the anesthesia team.  Successful cardioversion was performed with a 200 J. The patient converted to sinus and paced rhythm. Pre-and post EKGs were reviewed. The patient tolerated the procedure . There was mild hypotension which improved with 100 mcg of IV Neo-Synephrine  Recommendations: Return to inpatient unit. Continue same medications for now.

## 2015-09-18 NOTE — Progress Notes (Signed)
PT Cancellation Note  Patient Details Name: [name] [name] MRN: [id number] DOB: [birth year]   Cancelled Treatment:    Reason Eval/Treat Not Completed: Medical issues which prohibited therapy. PT reviewed patient's chart and noted patient had V/Q scan for PE, and per impression "was at intermediate risk for PE". PT will hold mobility until further management/recommendations provided. PT will continue to follow and progress mobility as able/appropriate.    Kerin Ransom, PT, DPT    09/18/2015, 4:38 PM

## 2015-09-18 NOTE — Transfer of Care (Signed)
Immediate Anesthesia Transfer of Care Note  Patient: [name] [name]  Procedure(s) Performed: Procedure(s): CARDIOVERSION (N/A)  Patient Location: PACU  Anesthesia Type:General  Level of Consciousness: sedated  Airway & Oxygen Therapy: Patient Spontanous Breathing and Patient connected to nasal cannula oxygen  Post-op Assessment: Report given to RN and Post -op Vital signs reviewed and stable  Post vital signs: Reviewed and stable  Last Vitals:  Filed Vitals:   09/18/15 0437 09/18/15 0859  BP: 96/66 104/73  Pulse: 82 94  Temp: 36.5 C   Resp: 18 27    Complications: No apparent anesthesia complications

## 2015-09-18 NOTE — Care Management Important Message (Signed)
Important Message  Patient Details  Name: [name] [name] MRN: [id number] Date of Birth: 07-Jan-1923   Medicare Important Message Given:  Yes    Ivyonna Hoelzel A, RN 09/18/2015, 8:29 AM

## 2015-09-18 NOTE — Progress Notes (Signed)
SATURATION QUALIFICATIONS: (This note is used to comply with regulatory documentation for home oxygen)  Patient Saturations on Room Air at Rest = 77%  Patient Saturations on Room Air while Ambulating = n/a%  Patient Saturations on n/a Liters of oxygen while Ambulating = n/a%  Please briefly explain why patient needs home oxygen: desats quickly on room air at rest

## 2015-09-18 NOTE — Telephone Encounter (Signed)
Thanks for letting me know -I will keep my eye out for records

## 2015-09-18 NOTE — Progress Notes (Signed)
Patient Name: [name] [name] Date of Encounter: 09/18/2015  Active Problems:   Atrial fibrillation [facility])   Primary Cardiologist: Dr. Graciela Husbands Patient Profile:18F w/ PMH of persistent atrial fibrillation, tachy-brady syndrome (s/p PPM), GIB, admitted on 09/15/2015 with hypoxic respiratory failure and acute on chronic diastolic heart failure.  SUBJECTIVE: Denies any dyspnea, chest pain, or palpitations. ON 2 L [place]. Having dysuria and started on Abx treatment for UTI yesterday. NPO since midnight for DCCV.  OBJECTIVE Filed Vitals:   09/17/15 0730 09/17/15 1238 09/17/15 2048 09/18/15 0437  BP: 93/55 116/65 118/82 96/66  Pulse: 86 99 95 82  Temp:  98.2 F (36.8 C) 98.7 F (37.1 C) 97.7 F (36.5 C)  TempSrc:  Oral Oral Oral  Resp:  Height:      Weight:    151 lb 12.8 oz (68.856 kg)  SpO2:  94% 95% 96%    Intake/Output Summary (Last 24 hours) at 09/18/15 0741 Last data filed at 09/18/15 0230  Gross per 24 hour  Intake    240 ml  Output    425 ml  Net   -185 ml   Filed Weights   09/16/15 0408 09/17/15 0552 09/18/15 0437  Weight: 145 lb 9.6 oz (66.044 kg) 146 lb 1.6 oz (66.271 kg) 151 lb 12.8 oz (68.856 kg)    PHYSICAL EXAM General: Well developed, well nourished, female in no acute distress. Head: Normocephalic, atraumatic.  Neck: Supple without bruits, JVD not elevated. Lungs:  Resp regular and unlabored, CTA without wheezing or rales. Heart: Irregularly irregular, S1, S2, no S3, S4, or murmur; no rub. Abdomen: Soft, non-tender, non-distended with normoactive bowel sounds. No hepatomegaly. No rebound/guarding. No obvious abdominal masses. Extremities: No clubbing or cyanosis. 1+  Edema bilaterally. Distal pedal pulses are 2+ bilaterally. Neuro: Alert and oriented X 3. Moves all extremities spontaneously. Psych: Normal affect.   LABS: CBC: Recent Labs  09/15/15 1156  HGB 13.8   Basic Metabolic Panel: Recent Labs  09/15/15 1156  09/16/15 0748   09/17/15 0747 09/18/15 0535  NA 141  --  141  142  < > 139 140  K 3.1*  < > 3.5  3.5  < > 4.2 3.5  CL 99*  --  99*  99*  < > 100* 97*  CO2 36*  --  38*  39*  < > 34* 38*  GLUCOSE 121*  --  103*  103*  < > 109* 111*  BUN 29*  --  29*  29*  < > 34* 41*  CREATININE 1.22*  --  1.42*  1.28*  < > 1.50* 1.89*  CALCIUM 8.6*  --  8.7*  8.7*  < > 8.6* 8.8*  MG 1.7  --  1.6*  --   --   --   PHOS  --   --  4.3  --   --   --   < > = values in this interval not displayed. Liver Function Tests: Recent Labs  09/16/15 0748  ALBUMIN 2.0*   BNP: No results found for: BNP PRO B NATRIURETIC PEPTIDE (BNP)  Date/Time Value Ref Range Status  08/08/2015 10:54 AM 946.0* 0.0 - 100.0 pg/mL Final    TELE:  Atrial fibrillation, HR better controlled in 80's - 90's.       TEE: 09/15/2015 Study Conclusions  - Left ventricle: Hypertrophy was noted. Systolic function was normal. The estimated ejection fraction was in the range of 50% to 55%. - Mitral  valve: Mildly calcified annulus. Moderately thickened leaflets . There was mild regurgitation. - Left atrium: The atrium was moderately dilated. No evidence of thrombus in the atrial cavity or appendage. There was severecontinuous spontaneous echo contrast (&quot;smoke&quot;) in the cavity and the appendage. The appendage was morphologically a left appendage, multilobulated, and moderately dilated. Emptying velocity was severely reduced. - Right atrium: No evidence of thrombus in the atrial cavity or appendage.  Radiology/Studies: US Venous Img Lower Bilateral: 09/16/2015  CLINICAL DATA:  82 year old female with bilateral lower extremity swelling and edema as well as shortness of breath EXAM: BILATERAL LOWER EXTREMITY VENOUS DOPPLER ULTRASOUND TECHNIQUE: Gray-scale sonography with graded compression, as well as color Doppler and duplex ultrasound were performed to evaluate the lower extremity deep venous systems from the level of the  common femoral vein and including the common femoral, femoral, profunda femoral, popliteal and calf veins including the posterior tibial, peroneal and gastrocnemius veins when visible. The superficial great saphenous vein was also interrogated. Spectral Doppler was utilized to evaluate flow at rest and with distal augmentation maneuvers in the common femoral, femoral and popliteal veins. COMPARISON:  None. FINDINGS: RIGHT LOWER EXTREMITY Common Femoral Vein: No evidence of thrombus. Normal compressibility, respiratory phasicity and response to augmentation. Saphenofemoral Junction: No evidence of thrombus. Normal compressibility and flow on color Doppler imaging. Profunda Femoral Vein: No evidence of thrombus. Normal compressibility and flow on color Doppler imaging. Femoral Vein: No evidence of thrombus. Normal compressibility, respiratory phasicity and response to augmentation. Popliteal Vein: No evidence of thrombus. Normal compressibility, respiratory phasicity and response to augmentation. Calf Veins: No evidence of thrombus. Normal compressibility and flow on color Doppler imaging. Superficial Great Saphenous Vein: No evidence of thrombus. Normal compressibility and flow on color Doppler imaging. Venous Reflux:  None. Other Findings:  None. LEFT LOWER EXTREMITY Common Femoral Vein: No evidence of thrombus. Normal compressibility, respiratory phasicity and response to augmentation. Saphenofemoral Junction: No evidence of thrombus. Normal compressibility and flow on color Doppler imaging. Profunda Femoral Vein: No evidence of thrombus. Normal compressibility and flow on color Doppler imaging. Femoral Vein: No evidence of thrombus. Normal compressibility, respiratory phasicity and response to augmentation. Popliteal Vein: No evidence of thrombus. Normal compressibility, respiratory phasicity and response to augmentation. Calf Veins: No evidence of thrombus. Normal compressibility and flow on color Doppler  imaging. Superficial Great Saphenous Vein: No evidence of thrombus. Normal compressibility and flow on color Doppler imaging. Venous Reflux:  None. Other Findings:  None. IMPRESSION: No evidence of deep venous thrombosis. Electronically Signed   By: Malachy Moan M.D.   On: 09/16/2015 12:28   Dg Chest Port 1 View: 09/17/2015  CLINICAL DATA:  Hypoxia EXAM: PORTABLE CHEST 1 VIEW COMPARISON:  09/15/2015 FINDINGS: Cardiomegaly again noted. Dual lead cardiac pacemaker is unchanged position. Central mild vascular congestion without convincing pulmonary edema. Small to moderate bilateral pleural effusion. Slight increase in right effusion. Bilateral basilar atelectasis or infiltrate. IMPRESSION: Central mild vascular congestion without convincing pulmonary edema. Small to moderate bilateral pleural effusion. Slight increase in right effusion. Bilateral basilar atelectasis or infiltrate. Electronically Signed   By: Natasha Mead M.D.   On: 09/17/2015 09:04     Current Medications:  . amiodarone  400 mg Oral BID  . apixaban  2.5 mg Oral BID  . cephALEXin  500 mg Oral Q12H  . cholecalciferol  2,000 Units Oral Daily  . donepezil  10 mg Oral Daily  . memantine  5 mg Oral BID  . metoprolol tartrate  25 mg Oral  BID  . mirtazapine  15 mg Oral QHS  . multivitamin with minerals  1 tablet Oral Daily  . pantoprazole  40 mg Oral Daily  . senna  1 tablet Oral QHS  . sertraline  100 mg Oral Daily  . sodium chloride  3 mL Intravenous Q12H   . sodium chloride      ASSESSMENT AND PLAN:  1. Paroxysmal Atrial fibrillation with RVR - underwent TEE/DCCV on 09/15/2015 with return to NSR for approximately one hour but unfortunately went back into atrial fibrillation with RVR later that day. - This patients CHA2DS2-VASc Score and unadjusted Ischemic Stroke Rate (% per year) is equal to 7.2 % stroke rate/year from a score of 5 (CHF, HTN, Age (2), Female). Continue Eliquis for anticoagulation. - on Amiodarone  BID,  with anticipation of switching to  daily on 09/19/2015. - Rate more well-controlled in the 80's - 90's. Continue Metoprolol tartrate  BID. - for DCCV today. Was not on schedule, therefore unsure of exact time of her procedure. Specials is coordinating with anesthesia for availability.  2. Acute on Chronic Diastolic CHF  - appears volume-overloaded on exam. Net output this admission is -425. Unsure if I&O's are accurate, for her recorded intake yesterday was only 240cc. - daily weights were down-trending from 149 on admission to 146 lbs on 09/17/2015. Her weight today is recorded as 151 lbs. - Lasix has been discontinued with elevating creatinine and in setting of dysuria with UTI. According to the patient's daughter, her edema is significantly improved when compared to prior to admission.  3. Tachy-brady Syndrome - s/p PPM placement.  4. Stage 3 CKD - creatinine 1.22 on admission. - elevated to 1.89 on 09/18/2015.    Signed, Ellsworth Lennox , PA-C 7:41 AM 09/18/2015 Pager: [phone]

## 2015-09-19 DIAGNOSIS — I4891 Unspecified atrial fibrillation: Secondary | ICD-10-CM | POA: Diagnosis not present

## 2015-09-19 DIAGNOSIS — J9621 Acute and chronic respiratory failure with hypoxia: Secondary | ICD-10-CM

## 2015-09-19 DIAGNOSIS — R0902 Hypoxemia: Secondary | ICD-10-CM

## 2015-09-19 DIAGNOSIS — J90 Pleural effusion, not elsewhere classified: Secondary | ICD-10-CM

## 2015-09-19 DIAGNOSIS — J9622 Acute and chronic respiratory failure with hypercapnia: Secondary | ICD-10-CM

## 2015-09-19 DIAGNOSIS — R5383 Other fatigue: Secondary | ICD-10-CM

## 2015-09-19 DIAGNOSIS — G9340 Encephalopathy, unspecified: Secondary | ICD-10-CM

## 2015-09-19 DIAGNOSIS — I5033 Acute on chronic diastolic (congestive) heart failure: Secondary | ICD-10-CM | POA: Diagnosis not present

## 2015-09-19 LAB — COMPREHENSIVE METABOLIC PANEL
ALK PHOS: 117 U/L (ref 38–126)
ALT: 31 U/L (ref 14–54)
ANION GAP: 8 (ref 5–15)
AST: 60 U/L — ABNORMAL HIGH (ref 15–41)
Albumin: 2.1 g/dL — ABNORMAL LOW (ref 3.5–5.0)
BILIRUBIN TOTAL: 0.5 mg/dL (ref 0.3–1.2)
BUN: 53 mg/dL — ABNORMAL HIGH (ref 6–20)
CALCIUM: 8.9 mg/dL (ref 8.9–10.3)
CO2: 35 mmol/L — ABNORMAL HIGH (ref 22–32)
CREATININE: 3 mg/dL — AB (ref 0.44–1.00)
Chloride: 95 mmol/L — ABNORMAL LOW (ref 101–111)
GFR, EST AFRICAN AMERICAN: 15 mL/min — AB (ref >60)
GFR, EST NON AFRICAN AMERICAN: 13 mL/min — AB (ref >60)
Glucose, Bld: 117 mg/dL — ABNORMAL HIGH (ref 65–99)
Potassium: 4.5 mmol/L (ref 3.5–5.1)
Sodium: 138 mmol/L (ref 135–145)
TOTAL PROTEIN: 5.7 g/dL — AB (ref 6.5–8.1)

## 2015-09-19 LAB — BLOOD GAS, ARTERIAL
ALLENS TEST (PASS/FAIL): POSITIVE — AB
ALLENS TEST (PASS/FAIL): POSITIVE — AB
Acid-Base Excess: 3.2 mmol/L — ABNORMAL HIGH (ref 0.0–3.0)
Acid-Base Excess: 5.8 mmol/L — ABNORMAL HIGH (ref 0.0–3.0)
Allens test (pass/fail): POSITIVE — AB
BICARBONATE: 36 meq/L — AB (ref 21.0–28.0)
BICARBONATE: 37.8 meq/L — AB (ref 21.0–28.0)
Bicarbonate: 36 mEq/L — ABNORMAL HIGH (ref 21.0–28.0)
DELIVERY SYSTEMS: POSITIVE
DELIVERY SYSTEMS: POSITIVE
EXPIRATORY PAP: 6
EXPIRATORY PAP: 10
FIO2: 100
FIO2: 1
FIO2: 0.65
INSPIRATORY PAP: 16
Inspiratory PAP: 18
O2 SAT: 95.1 %
O2 Saturation: 90.6 %
PATIENT TEMPERATURE: 37
PCO2 ART: 101 mmHg — AB (ref 32.0–48.0)
PH ART: 7.14 — AB (ref 7.350–7.450)
PH ART: 7.25 — AB (ref 7.350–7.450)
PO2 ART: 76 mmHg — AB (ref 83.0–108.0)
Patient temperature: 37
Patient temperature: 37
pCO2 arterial: 111 mmHg (ref 32.0–48.0)
pCO2 arterial: 82 mmHg (ref 32.0–48.0)
pH, Arterial: 7.16 — CL (ref 7.350–7.450)
pO2, Arterial: 119 mmHg — ABNORMAL HIGH (ref 83.0–108.0)
pO2, Arterial: 88 mmHg (ref 83.0–108.0)

## 2015-09-19 LAB — URINE CULTURE
Culture: 80000
Special Requests: NORMAL

## 2015-09-19 LAB — BASIC METABOLIC PANEL
Anion gap: 7 (ref 5–15)
BUN: 49 mg/dL — AB (ref 6–20)
CHLORIDE: 95 mmol/L — AB (ref 101–111)
CO2: 35 mmol/L — AB (ref 22–32)
CREATININE: 2.53 mg/dL — AB (ref 0.44–1.00)
Calcium: 9 mg/dL (ref 8.9–10.3)
GFR calc Af Amer: 18 mL/min — ABNORMAL LOW (ref >60)
GFR, EST NON AFRICAN AMERICAN: 16 mL/min — AB (ref >60)
GLUCOSE: 138 mg/dL — AB (ref 65–99)
Potassium: 3.9 mmol/L (ref 3.5–5.1)
Sodium: 137 mmol/L (ref 135–145)

## 2015-09-19 LAB — CBC
HEMATOCRIT: 45.2 % (ref 35.0–47.0)
HEMOGLOBIN: 14.1 g/dL (ref 12.0–16.0)
MCH: 30.7 pg (ref 26.0–34.0)
MCHC: 31.3 g/dL — AB (ref 32.0–36.0)
MCV: 98 fL (ref 80.0–100.0)
Platelets: 232 10*3/uL (ref 150–440)
RBC: 4.61 MIL/uL (ref 3.80–5.20)
RDW: 17.2 % — ABNORMAL HIGH (ref 11.5–14.5)
WBC: 8.8 10*3/uL (ref 3.6–11.0)

## 2015-09-19 LAB — APTT: APTT: 35 s (ref 24–36)

## 2015-09-19 LAB — HEPARIN LEVEL (UNFRACTIONATED): Heparin Unfractionated: 2 IU/mL — ABNORMAL HIGH (ref 0.30–0.70)

## 2015-09-19 LAB — PROTIME-INR
INR: 1.51
Prothrombin Time: 18.3 seconds — ABNORMAL HIGH (ref 11.4–15.0)

## 2015-09-19 LAB — MAGNESIUM: Magnesium: 1.9 mg/dL (ref 1.7–2.4)

## 2015-09-19 MED ORDER — ONDANSETRON HCL 4 MG/2ML IJ SOLN
4.0000 mg | Freq: Four times a day (QID) | INTRAMUSCULAR | Status: DC | PRN
  Administered 2015-09-19: 4 mg via INTRAVENOUS
  Filled 2015-09-19: qty 2

## 2015-09-19 MED ORDER — AMIODARONE HCL IN DEXTROSE 360-4.14 MG/200ML-% IV SOLN
30.0000 mg/h | INTRAVENOUS | Status: DC
  Administered 2015-09-20 (×2): 30 mg/h via INTRAVENOUS
  Filled 2015-09-19 (×4): qty 200

## 2015-09-19 MED ORDER — ONDANSETRON HCL 4 MG PO TABS: 4.0000 mg | ORAL_TABLET | Freq: Four times a day (QID) | ORAL | Status: DC | PRN

## 2015-09-19 MED ORDER — HEPARIN (PORCINE) IN NACL 100-0.45 UNIT/ML-% IJ SOLN
1050.0000 [IU]/h | INTRAMUSCULAR | Status: DC
  Administered 2015-09-19: 950 [IU]/h via INTRAVENOUS
  Filled 2015-09-19 (×2): qty 250

## 2015-09-19 NOTE — Progress Notes (Signed)
Called [facility] and Dr. Enedina Finner responded. Stated to hold PO meds for now since patient is on Bipap and still lethargic. Also states to repeat blood gas now since last gas was at 1305.

## 2015-09-19 NOTE — Telephone Encounter (Signed)
[name] called to give you an update  She still at [facility] Moving her to icu because she is having a hard time breath o2 keeps dropping They did lung xray yesterday afternoon   It showed possibly a clot around her lung and started her on a heprin drip Last night hard time breathing did not eat dinner would not wake up to eat breakfast.

## 2015-09-19 NOTE — Telephone Encounter (Signed)
Thanks for the update-I will continue to follow in epic

## 2015-09-19 NOTE — Progress Notes (Signed)
Asked by family to look in  Records reviewed 1) acute hypercarbic respiratory failure 2) B large peural effusions,  i suspect NOT PE 3) Holding sinus on amio 4) hypotension\ 5) oliguric UTI 6) hypothermia  SPoke with  Daughter  BP 149/82 mmHg  Pulse 35  Temp(Src) 98.2 F (36.8 C) (Oral)  Resp 13  Ht  (1.651 m)  Wt 148 lb 14.4 oz (67.541 kg)  BMI 24.78 kg/m2  SpO2 99% BBP 98 decrdase BS rrr Soft Edema Poorly responsive  Labs reviewed  REC Is she septic  IV heparin and Amio Throacentesis may be of benefit, apixovban t1/2 is 12 hrs so  But will be worsened with renal failure I wonder if therapeutic inutbation for acute hypercarbia would be of benefit notwithstanding DNR  I have told her daughter that prognosis is tenuous at best with multiorgan fialure and hypotension

## 2015-09-19 NOTE — Progress Notes (Signed)
ANTICOAGULATION CONSULT NOTE - Initial Consult  Pharmacy Consult for Heparin  Indication: atrial fibrillation  Allergies  Allergen Reactions  . Ace Inhibitors     REACTION: cough  . Alendronate Sodium     REACTION: aches  . Avapro [Irbesartan]     hypotension  . Codeine     Dizzy and nauseated   . Dabigatran Etexilate Mesylate Diarrhea    indigestion  . Nitrofurantoin     REACTION: rash  . Penicillins     REACTION: rash  . Prednisone Other (See Comments)    Extreme weakness  . Trazodone And Nefazodone Other (See Comments)    Nightmares     Patient Measurements: Height:  (165.1 cm) Weight: 148 lb 14.4 oz (67.541 kg) IBW/kg (Calculated) : 57 Heparin Dosing Weight:   Vital Signs: Temp: 96.8 F (36 C) (01/24 1900) BP: 102/62 mmHg (01/24 1900) Pulse Rate: 58 (01/24 1900)  Labs:  Recent Labs  09/17/15 0747 09/18/15 0535 09/19/15 0449  CREATININE 1.50* 1.89* 2.53*    Estimated Creatinine Clearance: 13.3 mL/min (by C-G formula based on Cr of 2.53).   Medical History: Past Medical History  Diagnosis Date  . Allergic rhinitis   . Anxiety   . Depression   . GERD (gastroesophageal reflux disease)   . Hypertension   . Osteopenia   . Bradycardia     a. s/p PPM  . Hyperlipidemia   . Duodenal ulcer   . Diverticulosis   . GI bleed   . PAF (paroxysmal atrial fibrillation) (HCC)     a. s/p TEE/DCCV 08/2015; On Eliquis  . Anemia associated with acute blood loss   . Diverticulosis   . Chronic diastolic CHF (congestive heart failure) (HCC)     a. 08/2015: EF 50-55%    Medications:  Prescriptions prior to admission  Medication Sig Dispense Refill Last Dose  . amiodarone (PACERONE) 400 MG tablet Take 1 tablet (400 mg total) by mouth 2 (two) times daily. 60 tablet 1 09/14/2015 at Unknown time  . apixaban (ELIQUIS) 2.5 MG TABS tablet Take 1 tablet (2.5 mg total) by mouth 2 (two) times daily. 60 tablet 11 09/14/2015 at Unknown time  . Cholecalciferol (VITAMIN  D3) 2000 UNITS TABS Take 1 capsule by mouth daily.   09/14/2015 at Unknown time  . [facility] SENNA 8.6 MG tablet TAKE 2 TABLETS BY MOUTH TWICE A DAY AT 8AM AND ALSP AT 5PM 120 tablet 11 09/14/2015 at Unknown time  . donepezil (ARICEPT) 10 MG tablet Take 1 tablet by mouth daily.   09/14/2015 at Unknown time  . furosemide (LASIX) 40 MG tablet Take one tablet (40 mg) by mouth x 7 days as directed 30 tablet 0 09/14/2015 at Unknown time  . memantine (NAMENDA) 5 MG tablet Take 5 mg by mouth 2 (two) times daily.  3 09/14/2015 at Unknown time  . metoprolol tartrate (LOPRESSOR) 25 MG tablet Take 1 tablet (25 mg total) by mouth 2 (two) times daily. 180 tablet 3 09/14/2015 at Unknown time  . mirtazapine (REMERON) 15 MG tablet Take 1 tablet (15 mg total) by mouth at bedtime. 30 tablet 11 09/14/2015 at Unknown time  . Multiple Vitamin (MULTIVITAMIN) tablet Take 1 tablet by mouth daily.     09/14/2015 at Unknown time  . omeprazole (PRILOSEC) 20 MG capsule TAKE 1 CAPSULE EVERY DAY 90 capsule 3 09/14/2015 at Unknown time  . sertraline (ZOLOFT) 100 MG tablet Take 1 tablet (100 mg total) by mouth daily. 90 tablet 3 09/14/2015 at Unknown  time  . SUPER B COMPLEX/C PO Take 1 capsule by mouth daily.   09/14/2015 at Unknown time  . doxycycline (VIBRA-TABS) 100 MG tablet Take 1 tablet (100 mg total) by mouth 2 (two) times daily. 20 tablet 0    Scheduled:  . cephALEXin  500 mg Oral Q12H  . cholecalciferol  2,000 Units Oral Daily  . donepezil  10 mg Oral Daily  . memantine  5 mg Oral BID  . mirtazapine  15 mg Oral QHS  . multivitamin with minerals  1 tablet Oral Daily  . pantoprazole  40 mg Oral Daily  . senna  1 tablet Oral QHS  . sertraline  100 mg Oral Daily  . sodium chloride  3 mL Intravenous Q12H    Assessment: Pharmacy consulted to assist with the dosing and monitoring of heparin therapy in this 82 year old female being anticoagulated for her atrial fibrillation. Patient is chronically on apixaban and received her last dose  of apixaban on 1/24 @ 09:38.   Per protocol, will need to start heparin gtt 12 hours from now.   Heparin level, APTT, PT/INR, and CBC pending.    Goal of Therapy:  Heparin level 0.3-0.7 units/ml Monitor platelets by anticoagulation protocol: Yes   Plan:  Will start heparin gtt @ 950 units/hr. Will need to order heparin levels and APTT unitl heparn level and APTT correlated. Once correlation occurs, can begin using heparin levels only.  First heparin level and APTT ordered to be drawn @ 06:00 on 1/25 (8 hours after the initiation of the heparin gtt).    Pharmacy to follow  Zamantha Strebel D 09/19/2015,7:15 PM

## 2015-09-19 NOTE — Consult Note (Signed)
MEDICATION RELATED CONSULT NOTE - Follow Up  Pharmacy Consult for Potassium Replacement Indication: Hypokalemia  Patient Measurements: Height:  (165.1 cm) Weight: 148 lb 14.4 oz (67.541 kg) IBW/kg (Calculated) : 57  Vital Signs: Temp: 98.2 F (36.8 C) (01/24 0425) Temp Source: Oral (01/24 0425) BP: 149/82 mmHg (01/24 1305) Pulse Rate: 35 (01/24 1305) Intake/Output from previous day: 01/23 0701 - 01/24 0700 In: 103 [I.V.:103] Out: 200 [Urine:200] Intake/Output from this shift: Total I/O In: 240 [P.O.:240] Out: -   Labs:  Recent Labs  09/17/15 0747 09/18/15 0535 09/19/15 0449  CREATININE 1.50* 1.89* 2.53*  MG  --   --  1.9   Estimated Creatinine Clearance: 13.3 mL/min (by C-G formula based on Cr of 2.53).  Assessment: VG is a 82yo female admitted with acute congestive heart failure and chronic afib. Pharmacy consulted to replete potassium in this patient.    Plan:  No additional supplement ordered at this time.   Will recheck with AM labs.   Deyra Perdomo L, PharmD Clinical Pharmacist 09/19/2015,3:54 PM

## 2015-09-19 NOTE — Progress Notes (Signed)
Pt feeling nauseous, no orders for nausea medication, MD paged, Dr. Anne Hahn to put in orders. Will continue to monitor. Shirley Friar, RN

## 2015-09-19 NOTE — Progress Notes (Signed)
Report called to [place] in CCU/ verbalized an understanding/ transported in bed

## 2015-09-19 NOTE — Progress Notes (Addendum)
[facility] -  at [facility]   PATIENT NAME: [name] [name]    MR#:  [id number]  DATE OF BIRTH:  01/07/1923  SUBJECTIVE: I feel good  CHIEF COMPLAINT:  No chief complaint on file.  patient is a 82 year old Caucasian female with past medical history significant for history of atrial fibrillation for which she underwent elective cardioversion with Dr. Kirke Corin. She remained in sinus rhythm for about one hour and then converted back into atrial fibrillation, became short of breath and presented  to the hospital hypoxic and in CHF. She was diuresed approximately 700 cc since admission. Patient was loaded with amiodarone and underwent elective cardioversion 23rd of January again, converted into sinus rhythm and remains in sinus rhythm. Poorly responsive today, moans, dyspneic, not able to contribute to review of systems   Review of Systems  Unable to perform ROS: acuity of condition    VITAL SIGNS: Blood pressure 149/82, pulse 35, temperature 98.2 F (36.8 C), temperature source Oral, resp. rate 13, height  (1.651 m), weight 67.541 kg (148 lb 14.4 oz), SpO2 99 %.  PHYSICAL EXAMINATION:   GENERAL:  82 y.o.-year-old patient lying in the bed in moderate to severe respiratory distress. Tachypneic, monos EYES: Pupils equal, round, reactive to light and accommodation. No scleral icterus. Extraocular muscles intact.  HEENT: Head atraumatic, normocephalic. Oropharynx and nasopharynx clear.  NECK:  Supple, no jugular venous distention. No thyroid enlargement, no tenderness.  LUNGS: Diminished breath sounds bilaterally at bases, no wheezing, bilateral crepitations anteriorly as well as posteriorly. Using accessory muscles of respiration at rest. Tachypneic CARDIOVASCULAR: S1, S2 , regular. No murmurs, rubs, or gallops.  ABDOMEN: Soft, nontender, nondistended. Bowel sounds present. No organomegaly or mass.  EXTREMITIES: Trace to 1+ lower extremity and pedal edema, no  cyanosis, or clubbing.  NEUROLOGIC: Cranial nerves II through XII are intact. Muscle strength 5/5 in all extremities. Sensation intact. Gait not checked.  PSYCHIATRIC: The patient is alert , but demented, very uncomfortable today. Very Dyspneic SKIN: No obvious rash, lesion, or ulcer.   ORDERS/RESULTS REVIEWED:   CBC  Recent Labs Lab 09/15/15 1156  HGB 13.8   ------------------------------------------------------------------------------------------------------------------  Chemistries   Recent Labs Lab 09/15/15 1156  09/16/15 0748 09/17/15 0451 09/17/15 0747 09/18/15 0535 09/19/15 0449  NA 141  --  141  142 141 139 140 137  K 3.1*  < > 3.5  3.5 3.6 4.2 3.5 3.9  CL 99*  --  99*  99* 98* 100* 97* 95*  CO2 36*  --  38*  39* 37* 34* 38* 35*  GLUCOSE 121*  --  103*  103* 114* 109* 111* 138*  BUN 29*  --  29*  29* 34* 34* 41* 49*  CREATININE 1.22*  --  1.42*  1.28* 1.46* 1.50* 1.89* 2.53*  CALCIUM 8.6*  --  8.7*  8.7* 9.0 8.6* 8.8* 9.0  MG 1.7  --  1.6*  --   --   --  1.9  < > = values in this interval not displayed. ------------------------------------------------------------------------------------------------------------------ estimated creatinine clearance is 13.3 mL/min (by C-G formula based on Cr of 2.53). ------------------------------------------------------------------------------------------------------------------ No results for input(s): TSH, T4TOTAL, T3FREE, THYROIDAB in the last 72 hours.  Invalid input(s): FREET3  Cardiac Enzymes No results for input(s): CKMB, TROPONINI, MYOGLOBIN in the last 168 hours.  Invalid input(s): CK ------------------------------------------------------------------------------------------------------------------ Invalid input(s): POCBNP ---------------------------------------------------------------------------------------------------------------  RADIOLOGY: Dg Chest 1 View  09/18/2015  CLINICAL DATA:  Shortness of breath  . EXAM: CHEST 1 VIEW COMPARISON:  09/17/2015. FINDINGS: Cardiac pacer noted with lead tips in right atrium and right ventricle. Cardiomegaly with pulmonary vascular prominence and bilateral pulmonary interstitial prominence bilateral effusions consistent congestive heart failure. IMPRESSION: 1. Cardiac pacer in stable position. 2. Congestive heart failure with bilateral pulmonary edema and bilateral pleural effusions. No change from prior exam. Electronically Signed   By: Maisie Fus  Register   On: 09/18/2015 09:08   Nm Pulmonary Perf And Vent  09/18/2015  CLINICAL DATA:  82 year old with tachycardia and acute CHF. Patient underwent cardioversion for atrial fibrillation earlier today. Personal history of pulmonary embolism and DVT. EXAM: NUCLEAR MEDICINE VENTILATION - PERFUSION LUNG SCAN TECHNIQUE: Ventilation images were obtained in multiple projections using inhaled aerosol Tc-34m DTPA. Perfusion images were obtained in multiple projections after intravenous injection of Tc-57m MAA. RADIOPHARMACEUTICALS:  32.4 Technetium-59m DTPA aerosol inhalation and 4.3 Technetium-61m MAA IV COMPARISON:  No prior nuclear imaging. Portable chest x-ray performed earlier same date is correlated. FINDINGS: Ventilation: Deposition of aerosol in the central bronchi. Patchy ventilation in both lungs, with diminished ventilation in the left lung relative to the right. Perfusion: Triple matched large segmental perfusion defects involving the lower lung zones bilaterally, correlating with the large bilateral pleural effusions, left greater than right, noted on the chest x-ray same day. IMPRESSION: Intermediate probability of pulmonary embolism based on PIOPED II criteria (triple matched large segmental perfusion defects involving the lower lung zones bilaterally). These defects correspond to the large bilateral pleural effusions, left greater than right on the chest x-ray earlier today. Electronically Signed   By: Hulan Saas M.D.    On: 09/18/2015 14:52    EKG:  Orders placed or performed during the hospital encounter of 09/15/15  . EKG 12-Lead pre-cardioversion  . EKG 12-Lead  . EKG 12-Lead pre-cardioversion  . EKG 12-Lead  . EKG 12-Lead  . EKG 12-Lead  . EKG 12-Lead  . EKG 12-Lead  . EKG 12-Lead  . EKG 12-Lead    ASSESSMENT AND PLAN:  Principal Problem:   Atrial fibrillation with RVR (HCC) Active Problems:   Acute on chronic diastolic (congestive) heart failure (HCC)   Hypoxia   Encephalopathy acute   Lethargic  #1 atrial fibrillation status post TEE and elective cardioversion 2,  patient is in sinus rhythm now, has been loaded with  amiodarone , continue amiodarone orally, cardiologist is following. Patient's heart rate is in 50s to 60s . Marland Kitchen Being continued on Eliquis. Venous ultrasound was negative for DVT.  #2. Acute on chronic diastolic CHF due to atrial fibrillation, of Lasix due to acute on chronic renal failure,  metoprolol is on hold, resume her blood pressure remains stable. Repeated chest x-ray reveals pulmonary edema, bilateral pleural effusions.  VQ scan intermediate probability for pulmonary embolism. May need to have thoracentesis done. If remains very uncomfortable, patient was transferred to CCU today for BiPAP initiation.   #3. Lower extremity edema, Doppler ultrasound revealed no evidence of DVT #4. Acute on chronic renal failure on underlying chronic stage III renal insufficiency, patient is off diuretics now. Following urinary output and creatinine in the morning #5. Dementia with behavioral problems. Supportive therapy  #6 generalized weakness, physical therapy recommends skilled nursing facility rehabilitation placement, getting social work  involved #7. Pyuria, concerning for urinary tract infection , . Patient is initiated on Keflex orally, urine cultures showed 80,000 colony-forming units of this mirabilis, pansensitive, except nitrofurantoin, continue Keflex #8. Acute respiratory  failure with hypoxia due to acute on chronic diastolic CHF, now on BiPAP and in intensive  care unit, not on diuretics due to worsening renal failure, following clinically and weaning of BiPAP as tolerated  Management plans discussed with the patient, family and they are in agreement.   DRUG ALLERGIES:  Allergies  Allergen Reactions  . Ace Inhibitors     REACTION: cough  . Alendronate Sodium     REACTION: aches  . Avapro [Irbesartan]     hypotension  . Codeine     Dizzy and nauseated   . Dabigatran Etexilate Mesylate Diarrhea    indigestion  . Nitrofurantoin     REACTION: rash  . Penicillins     REACTION: rash  . Prednisone Other (See Comments)    Extreme weakness  . Trazodone And Nefazodone Other (See Comments)    Nightmares     CODE STATUS:     Code Status Orders        Start     Ordered   09/15/15 1415  Do not attempt resuscitation (DNR)   Continuous    Question Answer Comment  In the event of cardiac or respiratory ARREST Do not call a "code blue"   In the event of cardiac or respiratory ARREST Do not perform Intubation, CPR, defibrillation or ACLS   In the event of cardiac or respiratory ARREST Use medication by any route, position, wound care, and other measures to relive pain and suffering. May use oxygen, suction and manual treatment of airway obstruction as needed for comfort.      09/15/15 1414    Code Status History    Date Active Date Inactive Code Status Order ID Comments User Context   10/11/2014  1:12 PM 10/12/2014 11:05 PM Full Code [id number]  Sheral Apley, MD Inpatient   06/23/2013  4:37 PM 06/26/2013  7:15 PM Full Code [id number]  Esperanza Sheets, MD Inpatient    Advance Directive Documentation        Most Recent Value   Type of Advance Directive  Healthcare Power of Attorney, Living will   Pre-existing out of facility DNR order (yellow form or pink MOST form)     "MOST" Form in Place?        TOTAL CRITICAL CARE TIME TAKING CARE OF THIS  PATIENT: 50 minutes.   discussed this patient's daughter , nephrologist, as well as intensive care specialist  Laquetta Racey M.D on 09/19/2015 at 4:15 PM  Between 7am to 6pm - Pager - [phone]  After 6pm go to [email or web address] - password [email or web address] [facility]  [place] [facility]  Office  [phone]  CC: Primary care physician; Roxy Manns, MD

## 2015-09-19 NOTE — Progress Notes (Addendum)
PULMONARY / CRITICAL CARE MEDICINE   Name: [name] [name] MRN: [id number] DOB: 10-01-1922    ADMISSION DATE:  09/15/2015 CONSULTATION DATE:  09/18/14  REFERRING MD :  Dr. Winona Legato   CHIEF COMPLAINT:     SOB, palpitations Reason for consult - Respiratory failure requiring bipap   HISTORY OF PRESENT ILLNESS  History per chart review and family members at the bedside  82 y.o. female with a known history of dementia, tachybradycardia syndrome status post pacemaker placement, hypertension, history of chronic atrial fibrillation on anticoagulation with eliquis, history of GI bleed in the past underwent elective cardioversion by Dr.arida on 08/2015. Patient received cardioversion with 120 J after she was given anesthesia. Post procedure patient remained hypoxic with sats anywhere from 85-90% on room air, she was managed by hospitalist and cardiology on the medical unit. Records show that she was being treated for CHF exacerbation with diuresis, but continued to feel dyspneic and weak, also noted to still be in a.fib.   She was load with amiodarone and underwent another elective cardioversion 09/18/15, converted to NSR, but severely short of breath. Overnight she was noted to have increased lethargy and sob, requiring bipap.  ABG showed respiratory failure with hypercapnia.     SIGNIFICANT EVENTS  1/20>>cardioversion, NSR for 1 hour, then went back into afib 1/23>>cardioversion again, NSR    PAST MEDICAL HISTORY    :  Past Medical History  Diagnosis Date  . Allergic rhinitis   . Anxiety   . Depression   . GERD (gastroesophageal reflux disease)   . Hypertension   . Osteopenia   . Bradycardia     a. s/p PPM  . Hyperlipidemia   . Duodenal ulcer   . Diverticulosis   . GI bleed   . PAF (paroxysmal atrial fibrillation) (HCC)     a. s/p TEE/DCCV 08/2015; On Eliquis  . Anemia associated with acute blood loss   . Diverticulosis   . Chronic diastolic CHF (congestive heart failure)  (HCC)     a. 08/2015: EF 50-55%   Past Surgical History  Procedure Laterality Date  . Appendectomy    . Total abdominal hysterectomy    . Tonsillectomy    . Ankle fracture surgery Right 05/2002  . Insert / replace / remove pacemaker    . Cardiac defibrillator placement    . Esophagogastroduodenoscopy N/A 06/24/2013    Procedure: ESOPHAGOGASTRODUODENOSCOPY (EGD);  Surgeon: Hilarie Fredrickson, MD;  Location: [facility];  Service: Endoscopy;  Laterality: N/A;  . Colonoscopy N/A 06/25/2013    Procedure: COLONOSCOPY;  Surgeon: Hilarie Fredrickson, MD;  Location: [facility];  Service: Endoscopy;  Laterality: N/A;  . Total hip arthroplasty Left 10/11/2014    DR MURPHY  . Total hip arthroplasty Left 10/11/2014    Procedure: TOTAL HIP ARTHROPLASTY ANTERIOR APPROACH;  Surgeon: Sheral Apley, MD;  Location: [facility] OR;  Service: Orthopedics;  Laterality: Left;  . Electrophysiologic study N/A 09/15/2015    Procedure: CARDIOVERSION;  Surgeon: Iran Ouch, MD;  Location: [facility];  Service: Cardiovascular;  Laterality: N/A;  . Tee without cardioversion N/A 09/15/2015    Procedure: TRANSESOPHAGEAL ECHOCARDIOGRAM (TEE);  Surgeon: Iran Ouch, MD;  Location: [facility];  Service: Cardiovascular;  Laterality: N/A;  . Electrophysiologic study N/A 09/18/2015    Procedure: CARDIOVERSION;  Surgeon: Iran Ouch, MD;  Location: [facility];  Service: Cardiovascular;  Laterality: N/A;   Prior to Admission medications   Medication Sig Start Date End Date Taking? Authorizing Provider  amiodarone (  PACERONE) 400 MG tablet Take 1 tablet (400 mg total) by mouth 2 (two) times daily. 09/12/15  Yes Duke Salvia, MD  apixaban (ELIQUIS) 2.5 MG TABS tablet Take 1 tablet (2.5 mg total) by mouth 2 (two) times daily. 09/12/15  Yes Duke Salvia, MD  Cholecalciferol (VITAMIN D3) 2000 UNITS TABS Take 1 capsule by mouth daily.   Yes Historical Provider, MD  [facility] SENNA 8.6 MG tablet TAKE 2 TABLETS BY MOUTH TWICE A DAY AT 8AM AND ALSP  AT 5PM 12/19/14  Yes Marne A Tower, MD  donepezil (ARICEPT) 10 MG tablet Take 1 tablet by mouth daily. 03/21/15  Yes Historical Provider, MD  furosemide (LASIX) 40 MG tablet Take one tablet (40 mg) by mouth x 7 days as directed 09/12/15  Yes Duke Salvia, MD  memantine (NAMENDA) 5 MG tablet Take 5 mg by mouth 2 (two) times daily. 05/10/15  Yes Historical Provider, MD  metoprolol tartrate (LOPRESSOR) 25 MG tablet Take 1 tablet (25 mg total) by mouth 2 (two) times daily. 11/30/14  Yes Judy Pimple, MD  mirtazapine (REMERON) 15 MG tablet Take 1 tablet (15 mg total) by mouth at bedtime. 08/31/15  Yes Judy Pimple, MD  Multiple Vitamin (MULTIVITAMIN) tablet Take 1 tablet by mouth daily.     Yes Historical Provider, MD  omeprazole (PRILOSEC) 20 MG capsule TAKE 1 CAPSULE EVERY DAY 11/30/14  Yes Judy Pimple, MD  sertraline (ZOLOFT) 100 MG tablet Take 1 tablet (100 mg total) by mouth daily. 08/02/14  Yes Judy Pimple, MD  SUPER B COMPLEX/C PO Take 1 capsule by mouth daily.   Yes Historical Provider, MD  doxycycline (VIBRA-TABS) 100 MG tablet Take 1 tablet (100 mg total) by mouth 2 (two) times daily. 08/31/15   Judy Pimple, MD   Allergies  Allergen Reactions  . Ace Inhibitors     REACTION: cough  . Alendronate Sodium     REACTION: aches  . Avapro [Irbesartan]     hypotension  . Codeine     Dizzy and nauseated   . Dabigatran Etexilate Mesylate Diarrhea    indigestion  . Nitrofurantoin     REACTION: rash  . Penicillins     REACTION: rash  . Prednisone Other (See Comments)    Extreme weakness  . Trazodone And Nefazodone Other (See Comments)    Nightmares      FAMILY HISTORY   Family History  Problem Relation Age of Onset  . Heart attack Father   . Depression Mother   . Alzheimer's disease Mother   . Pneumonia Mother   . Heart disease Brother     hx of CABG      SOCIAL HISTORY    reports that she has never smoked. She has never used smokeless tobacco. She reports that she does not  drink alcohol or use illicit drugs.  Review of Systems  Unable to perform ROS: mental acuity      VITAL SIGNS    Temp:  [97.8 F (36.6 C)-98.2 F (36.8 C)] 98.2 F (36.8 C) (01/24 0425) Pulse Rate:  [35-65] 35 (01/24 1305) Resp:  [13-18] 13 (01/24 1305) BP: (106-149)/(54-82) 149/82 mmHg (01/24 1305) SpO2:  [85 %-100 %] 99 % (01/24 1305) FiO2 (%):  [100 %] 100 % (01/24 1200) Weight:  [148 lb 14.4 oz (67.541 kg)] 148 lb 14.4 oz (67.541 kg) (01/24 0425) HEMODYNAMICS:   VENTILATOR SETTINGS: Vent Mode:  [-]  FiO2 (%):  [100 %] 100 % INTAKE /  OUTPUT:  Intake/Output Summary (Last 24 hours) at 09/19/15 1502 Last data filed at 09/19/15 0900  Gross per 24 hour  Intake    243 ml  Output     50 ml  Net    193 ml       PHYSICAL EXAM   Physical Exam  Constitutional: She appears well-developed and well-nourished. She appears distressed.  HENT:  Head: Normocephalic and atraumatic.  Right Ear: External ear normal.  Left Ear: External ear normal.  Neck: Normal range of motion. JVD present.  Cardiovascular: Normal rate and intact distal pulses.   Murmur heard. Pulmonary/Chest: She is in respiratory distress. She has no wheezes. She has no rales. She exhibits no tenderness.  Wearing bipap Positive use of SCM accessory muscles Shallow breath sounds  Abdominal: Bowel sounds are normal. She exhibits no distension. There is no tenderness.  Musculoskeletal: Normal range of motion.  Neurological: She is alert.  Skin: Skin is warm and dry.  Nursing note and vitals reviewed.      LABS   LABS:  CBC  Recent Labs Lab 09/15/15 1156  HGB 13.8   Coag's No results for input(s): APTT, INR in the last 168 hours. BMET  Recent Labs Lab 09/17/15 0747 09/18/15 0535 09/19/15 0449  NA 139 140 137  K 4.2 3.5 3.9  CL 100* 97* 95*  CO2 34* 38* 35*  BUN 34* 41* 49*  CREATININE 1.50* 1.89* 2.53*  GLUCOSE 109* 111* 138*   Electrolytes  Recent Labs Lab 09/15/15 1156  09/16/15 0748  09/17/15 0747 09/18/15 0535 09/19/15 0449  CALCIUM 8.6* 8.7*  8.7*  < > 8.6* 8.8* 9.0  MG 1.7 1.6*  --   --   --  1.9  PHOS  --  4.3  --   --   --   --   < > = values in this interval not displayed. Sepsis Markers No results for input(s): LATICACIDVEN, PROCALCITON, O2SATVEN in the last 168 hours. ABG  Recent Labs Lab 09/19/15 1045 09/19/15 1305  PHART 7.16* 7.14*  PCO2ART 101* 111*  PO2ART 76* 119*   Liver Enzymes  Recent Labs Lab 09/16/15 0748  ALBUMIN 2.0*   Cardiac Enzymes No results for input(s): TROPONINI, PROBNP in the last 168 hours. Glucose No results for input(s): GLUCAP in the last 168 hours.   Recent Results (from the past 240 hour(s))  Urine culture     Status: None   Collection Time: 09/17/15  4:17 PM  Result Value Ref Range Status   Specimen Description URINE, CLEAN CATCH  Final   Special Requests Normal  Final   Culture 80,000 COLONIES/ml PROTEUS MIRABILIS  Final   Report Status 09/19/2015 FINAL  Final   Organism ID, Bacteria PROTEUS MIRABILIS  Final      Susceptibility   Proteus mirabilis - MIC*    AMPICILLIN <=2 SENSITIVE Sensitive     CEFAZOLIN <=4 SENSITIVE Sensitive     CEFTRIAXONE <=1 SENSITIVE Sensitive     CIPROFLOXACIN <=0.25 SENSITIVE Sensitive     GENTAMICIN <=1 SENSITIVE Sensitive     IMIPENEM <=0.25 SENSITIVE Sensitive     NITROFURANTOIN 128 RESISTANT Resistant     TRIMETH/SULFA <=20 SENSITIVE Sensitive     CEFTAZIDIME Value in next row Sensitive      SENSITIVE<=1    PIP/TAZO Value in next row Sensitive      SENSITIVE<=4    AMPICILLIN/SULBACTAM Value in next row Sensitive      SENSITIVE<=2    * 80,000 COLONIES/ml  PROTEUS MIRABILIS     Current facility-administered medications:  .  0.9 %  sodium chloride infusion, 250 mL, Intravenous, Continuous, Chilton Si, MD .  ALPRAZolam Prudy Feeler) tablet 0.25 mg, 0.25 mg, Oral, TID PRN, Oralia Manis, MD, 0.25 mg at 09/19/15 0036 .  amiodarone (PACERONE) tablet 400 mg,  400 mg, Oral, BID, Chilton Si, MD, 400 mg at 09/19/15 0937 .  apixaban (ELIQUIS) tablet 2.5 mg, 2.5 mg, Oral, BID, Katharina Caper, MD, 2.5 mg at 09/19/15 0938 .  cephALEXin (KEFLEX) capsule 500 mg, 500 mg, Oral, Q12H, Katharina Caper, MD, 500 mg at 09/19/15 0936 .  cholecalciferol (VITAMIN D) tablet 2,000 Units, 2,000 Units, Oral, Daily, Enedina Finner, MD, 2,000 Units at 09/19/15 [id number] .  donepezil (ARICEPT) tablet 10 mg, 10 mg, Oral, Daily, Enedina Finner, MD, 10 mg at 09/19/15 0937 .  memantine (NAMENDA) tablet 5 mg, 5 mg, Oral, BID, Enedina Finner, MD, 5 mg at 09/19/15 9604 .  mirtazapine (REMERON) tablet 15 mg, 15 mg, Oral, QHS, Enedina Finner, MD, 15 mg at 09/18/15 2136 .  multivitamin with minerals tablet 1 tablet, 1 tablet, Oral, Daily, Enedina Finner, MD, 1 tablet at 09/19/15 0937 .  ondansetron (ZOFRAN) tablet 4 mg, 4 mg, Oral, Q6H PRN **OR** ondansetron (ZOFRAN) injection 4 mg, 4 mg, Intravenous, Q6H PRN, Oralia Manis, MD, 4 mg at 09/19/15 0115 .  pantoprazole (PROTONIX) EC tablet 40 mg, 40 mg, Oral, Daily, Enedina Finner, MD, 40 mg at 09/19/15 0937 .  senna (SENOKOT) tablet 8.6 mg, 1 tablet, Oral, QHS, Enedina Finner, MD, 8.6 mg at 09/17/15 2124 .  sertraline (ZOLOFT) tablet 100 mg, 100 mg, Oral, Daily, Enedina Finner, MD, 100 mg at 09/19/15 0937 .  sodium chloride 0.9 % injection 3 mL, 3 mL, Intravenous, Q12H, Chilton Si, MD, 3 mL at 09/19/15 0938 .  sodium chloride 0.9 % injection 3 mL, 3 mL, Intravenous, PRN, Chilton Si, MD, 3 mL at 09/18/15 1133  IMAGING    No results found.    Indwelling Urinary Catheter continued, requirement due to   Reason to continue Indwelling Urinary Catheter for strict Intake/Output monitoring for hemodynamic instability   Central Line continued, requirement due to   Reason to continue [employer] Monitoring of central venous pressure or other hemodynamic parameters   Ventilator continued, requirement due to, resp failure    Ventilator Sedation RASS 0 to -2    Cultures: BCx2  UC  Sputum  Antibiotics:  Lines:   ASSESSMENT/PLAN  82 yo female with Afib, s/p cardioverison (DCCV x 2), dCHF, tachy-brady syndrome, CKDIII, admitted to the ICU with acute hypercapnic respiratory failure and bilateral pleural effusions.   PULMONARY Hypercapnic respiratory failure Bilateral Pleural effusion P:   - cont with bipap, repeat abg still with hypercapnia, Bipap adjusted - maintain O2 sats > 90% - DNR - monitor I\Os - Pleural effusion not assisting with gas exchange, if no improvement, may need to consider therapeutic thoracentesis (high risk procedure in this patient).  - V/Q scan consistent with pleural effusion - CXR reviewed by me showed bilateral pleural effusion L>R, cont with bipap - hopefully positive pressure will help with diuresis, if not may need to consider one time therapeutic thoracentesis.   CARDIOVASCULAR Paroxysmal Afib dCHF - acute on chronic Tachybrady Syndrome P:  -s/p DCCV x 2 - on amiodarone - cardiology following along  RENAL Acute renal insufficiency CKD III UTI P: - mostly likely related to diuresis, high bicarb noted on BMP - monitor I\O - BMP in the am -  monitor Cr - cont with keflex  GASTROINTESTINAL SUP  P:   PPI  HEMATOLOGIC - montior CBC   NEUROLOGIC A:  Acute encephalopathy - metabolic P:   RASS goal: 0     I have personally obtained a history, examined the patient, evaluated laboratory and imaging results, formulated the assessment and plan and placed orders.  The Patient requires high complexity decision making for assessment and support, frequent evaluation and titration of therapies, application of advanced monitoring technologies and extensive interpretation of multiple databases.   Critical Care Time devoted to patient care services described in this note is 55 minutes.   Overall, patient is critically ill, prognosis is guarded. Patient at high risk for cardiac arrest and death.    Stephanie Acre, MD [facility] Pager [phone] (please enter 7-digits) On Call Pager - [phone] (please enter 7-digits)     09/19/2015, 3:02 PM  Note: This note was prepared with Dragon dictation along with smaller phrase technology. Any transcriptional errors that result from this process are unintentional.

## 2015-09-19 NOTE — Progress Notes (Signed)
Patient: [name] [name] / Admit Date: 09/15/2015 / Date of Encounter: 09/19/2015, 10:10 AM   Subjective: Lethargic this morning, on oxygen, not eating much Discussed with caretaker at the bedside Still on oxygen by nasal cannula for respiratory distress VQ scan yesterday consistent with pleural effusion Chest x-ray yesterday with pleural effusion Images reviewed independently by myself  Review of Systems: Review of Systems  Unable to perform ROS  patient is lethargic/sleepy   Objective: Telemetry: Telemetry reviewed showing normal sinus rhythm rate in the 60s Physical Exam: Blood pressure 116/62, pulse 63, temperature 98.2 F (36.8 C), temperature source Oral, resp. rate 18, height  (1.651 m), weight 148 lb 14.4 oz (67.541 kg), SpO2 90 %. Body mass index is 24.78 kg/(m^2). General: Well developed, well nourished, nasal cannula oxygen in place Head: Normocephalic, atraumatic, sclera non-icteric, no xanthomas, nares are without discharge. Neck: Negative for carotid bruits. JVP not elevated. Lungs: Mildly labored, decreased breath sounds with crackles halfway up from the bases bilaterally Heart: RRR S1 S2 without murmurs, rubs, or gallops.  Abdomen: Soft, non-tender, non-distended with normoactive bowel sounds. No rebound/guarding. Extremities: No clubbing or cyanosis. No edema. Distal pedal pulses are 2+ and equal bilaterally. Neuro: Alert and oriented X 3. Moves all extremities spontaneously. Psych:  Responds to questions appropriately with a normal affect.   Intake/Output Summary (Last 24 hours) at 09/19/15 1010 Last data filed at 09/19/15 0900  Gross per 24 hour  Intake    243 ml  Output    200 ml  Net     43 ml    Inpatient Medications:  . amiodarone  400 mg Oral BID  . apixaban  2.5 mg Oral BID  . cephALEXin  500 mg Oral Q12H  . cholecalciferol  2,000 Units Oral Daily  . donepezil  10 mg Oral Daily  . memantine  5 mg Oral BID  . mirtazapine  15 mg Oral  QHS  . multivitamin with minerals  1 tablet Oral Daily  . pantoprazole  40 mg Oral Daily  . senna  1 tablet Oral QHS  . sertraline  100 mg Oral Daily  . sodium chloride  3 mL Intravenous Q12H   Infusions:  . sodium chloride      Labs:  Recent Labs  09/18/15 0535 09/19/15 0449  NA 140 137  K 3.5 3.9  CL 97* 95*  CO2 38* 35*  GLUCOSE 111* 138*  BUN 41* 49*  CREATININE 1.89* 2.53*  CALCIUM 8.8* 9.0  MG  --  1.9   No results for input(s): AST, ALT, ALKPHOS, BILITOT, PROT, ALBUMIN in the last 72 hours. No results for input(s): WBC, NEUTROABS, HGB, HCT, MCV, PLT in the last 72 hours. No results for input(s): CKTOTAL, CKMB, TROPONINI in the last 72 hours. Invalid input(s): POCBNP No results for input(s): HGBA1C in the last 72 hours.   Weights: Filed Weights   09/17/15 0552 09/18/15 0437 09/19/15 0425  Weight: 146 lb 1.6 oz (66.271 kg) 151 lb 12.8 oz (68.856 kg) 148 lb 14.4 oz (67.541 kg)     Radiology/Studies:  Dg Chest 1 View  09/18/2015  CLINICAL DATA:  Shortness of breath . EXAM: CHEST 1 VIEW COMPARISON:  09/17/2015. FINDINGS: Cardiac pacer noted with lead tips in right atrium and right ventricle. Cardiomegaly with pulmonary vascular prominence and bilateral pulmonary interstitial prominence bilateral effusions consistent congestive heart failure. IMPRESSION: 1. Cardiac pacer in stable position. 2. Congestive heart failure with bilateral pulmonary edema and bilateral pleural effusions.  No change from prior exam. Electronically Signed   By: Maisie Fus  Register   On: 09/18/2015 09:08   Nm Pulmonary Perf And Vent  09/18/2015  CLINICAL DATA:  82 year old with tachycardia and acute CHF. Patient underwent cardioversion for atrial fibrillation earlier today. Personal history of pulmonary embolism and DVT. EXAM: NUCLEAR MEDICINE VENTILATION - PERFUSION LUNG SCAN TECHNIQUE: Ventilation images were obtained in multiple projections using inhaled aerosol Tc-23m DTPA. Perfusion images were  obtained in multiple projections after intravenous injection of Tc-39m MAA. RADIOPHARMACEUTICALS:  32.4 Technetium-50m DTPA aerosol inhalation and 4.3 Technetium-63m MAA IV COMPARISON:  No prior nuclear imaging. Portable chest x-ray performed earlier same date is correlated. FINDINGS: Ventilation: Deposition of aerosol in the central bronchi. Patchy ventilation in both lungs, with diminished ventilation in the left lung relative to the right. Perfusion: Triple matched large segmental perfusion defects involving the lower lung zones bilaterally, correlating with the large bilateral pleural effusions, left greater than right, noted on the chest x-ray same day. IMPRESSION: Intermediate probability of pulmonary embolism based on PIOPED II criteria (triple matched large segmental perfusion defects involving the lower lung zones bilaterally). These defects correspond to the large bilateral pleural effusions, left greater than right on the chest x-ray earlier today. Electronically Signed   By: Hulan Saas M.D.   On: 09/18/2015 14:52   US Venous Img Lower Bilateral  09/16/2015  CLINICAL DATA:  82 year old female with bilateral lower extremity swelling and edema as well as shortness of breath EXAM: BILATERAL LOWER EXTREMITY VENOUS DOPPLER ULTRASOUND TECHNIQUE: Gray-scale sonography with graded compression, as well as color Doppler and duplex ultrasound were performed to evaluate the lower extremity deep venous systems from the level of the common femoral vein and including the common femoral, femoral, profunda femoral, popliteal and calf veins including the posterior tibial, peroneal and gastrocnemius veins when visible. The superficial great saphenous vein was also interrogated. Spectral Doppler was utilized to evaluate flow at rest and with distal augmentation maneuvers in the common femoral, femoral and popliteal veins. COMPARISON:  None. FINDINGS: RIGHT LOWER EXTREMITY Common Femoral Vein: No evidence of  thrombus. Normal compressibility, respiratory phasicity and response to augmentation. Saphenofemoral Junction: No evidence of thrombus. Normal compressibility and flow on color Doppler imaging. Profunda Femoral Vein: No evidence of thrombus. Normal compressibility and flow on color Doppler imaging. Femoral Vein: No evidence of thrombus. Normal compressibility, respiratory phasicity and response to augmentation. Popliteal Vein: No evidence of thrombus. Normal compressibility, respiratory phasicity and response to augmentation. Calf Veins: No evidence of thrombus. Normal compressibility and flow on color Doppler imaging. Superficial Great Saphenous Vein: No evidence of thrombus. Normal compressibility and flow on color Doppler imaging. Venous Reflux:  None. Other Findings:  None. LEFT LOWER EXTREMITY Common Femoral Vein: No evidence of thrombus. Normal compressibility, respiratory phasicity and response to augmentation. Saphenofemoral Junction: No evidence of thrombus. Normal compressibility and flow on color Doppler imaging. Profunda Femoral Vein: No evidence of thrombus. Normal compressibility and flow on color Doppler imaging. Femoral Vein: No evidence of thrombus. Normal compressibility, respiratory phasicity and response to augmentation. Popliteal Vein: No evidence of thrombus. Normal compressibility, respiratory phasicity and response to augmentation. Calf Veins: No evidence of thrombus. Normal compressibility and flow on color Doppler imaging. Superficial Great Saphenous Vein: No evidence of thrombus. Normal compressibility and flow on color Doppler imaging. Venous Reflux:  None. Other Findings:  None. IMPRESSION: No evidence of deep venous thrombosis. Electronically Signed   By: Malachy Moan M.D.   On: 09/16/2015 12:28   Dg  Chest Port 1 View  09/17/2015  CLINICAL DATA:  Hypoxia EXAM: PORTABLE CHEST 1 VIEW COMPARISON:  09/15/2015 FINDINGS: Cardiomegaly again noted. Dual lead cardiac pacemaker is  unchanged position. Central mild vascular congestion without convincing pulmonary edema. Small to moderate bilateral pleural effusion. Slight increase in right effusion. Bilateral basilar atelectasis or infiltrate. IMPRESSION: Central mild vascular congestion without convincing pulmonary edema. Small to moderate bilateral pleural effusion. Slight increase in right effusion. Bilateral basilar atelectasis or infiltrate. Electronically Signed   By: Natasha Mead M.D.   On: 09/17/2015 09:04   Dg Chest Port 1 View  09/15/2015  CLINICAL DATA:  CHF EXAM: PORTABLE CHEST 1 VIEW COMPARISON:  09/11/2011 FINDINGS: Left pacer remains in place, unchanged. Cardiomegaly. Vascular congestion. Small bilateral pleural effusions with bibasilar atelectasis or infiltrates. No acute bony abnormality. IMPRESSION: Cardiomegaly with vascular congestion. Small bilateral pleural effusions with bibasilar atelectasis or infiltrates. Electronically Signed   By: Charlett Nose M.D.   On: 09/15/2015 12:07     Assessment and Plan  82 y.o. female    1. Paroxysmal Atrial fibrillation with RVR - underwent TEE/DCCV on 09/15/2015 with return to NSR for approximately one hour but unfortunately went back into atrial fibrillation with RVR  --repeat DCCV yesterday on amiodarone, holding NSR on tele Metoprolol held for hypotension Continue Eliquis for anticoagulation. - on Amiodarone  BID, with anticipation of switching to  daily on 09/19/2015.  CHA2DS2-VASc Score and unadjusted Ischemic Stroke Rate (% per year) is equal to 7.2 % stroke rate/year from a score of 5 (CHF, HTN, Age (2), Female).   2. Acute on Chronic Diastolic CHF  Creatinine climbed on lasix and UTI, now held Edema improved Persistent pleural effusion b/l  L>R ---may need thoracentesis for SOB, pleural effusions  3. Tachy-brady Syndrome - s/p PPM placement.  4. Stage 3 CKD Creatinine 2.5 now Nephrology following, discussed case with Dr. Ronn Melena in detail Lasix  on hold, likely overdiuresis  5: Pleural effusion Moderate in size on x-ray and VQ scan Contributing to her shortness of breath symptoms If no dramatic improvement today, may need to consider thoracentesis Discussed with caretaker, daughter not at the bedside at this time. Risk and benefit discussed with caretaker   Signed, Dossie Arbour, MD, Ph.D [facility] 09/19/2015, 10:10 AM

## 2015-09-19 NOTE — Consult Note (Signed)
[facility]  CONSULT NOTE    Date: 09/19/2015                  Patient Name:  [name] [name]  MRN: [id number]  DOB: Jan 19, 1923  Age / Sex: 82 y.o., female         PCP: Loura Pardon, MD                 Service Requesting Consult: Dr. Ether Griffins                 Reason for Consult: Acute renal Failure            History of Present Illness: [name] [name] is a 82 y.o.white  female with congestive heart failure, atrial fibrillation, hyperlipidemia, hypertension, who was admitted to [facility] on 09/15/2015 for Cardioversion and TEE        AFIB No anesthesia needed for TEE for scheduling purposes only atrial fib  Patient 's sitter is at bedside and assists with history taking. Patient is short of breath and uncomfortable.   Creatinine on admission of 1.22, eGFR of 38. Now creatinine has risen to 2.53 and nephrology consulted. Patient has been getting furosemide IV for diuresis. However patient's pulmonary edema and pleural effusion have not improved much.   She has also been diagnosed with a UTI, urine culture with gram negative rods. Currently on cephalexin.    Medications: Outpatient medications: Prescriptions prior to admission  Medication Sig Dispense Refill Last Dose  . amiodarone (PACERONE) 400 MG tablet Take 1 tablet (400 mg total) by mouth 2 (two) times daily. 60 tablet 1 09/14/2015 at Unknown time  . apixaban (ELIQUIS) 2.5 MG TABS tablet Take 1 tablet (2.5 mg total) by mouth 2 (two) times daily. 60 tablet 11 09/14/2015 at Unknown time  . Cholecalciferol (VITAMIN D3) 2000 UNITS TABS Take 1 capsule by mouth daily.   09/14/2015 at Unknown time  . [employer] SENNA 8.6 MG tablet TAKE 2 TABLETS BY MOUTH TWICE A DAY AT 8AM AND ALSP AT 5PM 120 tablet 11 09/14/2015 at Unknown time  . donepezil (ARICEPT) 10 MG tablet Take 1 tablet by mouth daily.   09/14/2015 at Unknown time  . furosemide (LASIX) 40 MG tablet Take one tablet (40 mg) by mouth x 7 days as directed 30  tablet 0 09/14/2015 at Unknown time  . memantine (NAMENDA) 5 MG tablet Take 5 mg by mouth 2 (two) times daily.  3 09/14/2015 at Unknown time  . metoprolol tartrate (LOPRESSOR) 25 MG tablet Take 1 tablet (25 mg total) by mouth 2 (two) times daily. 180 tablet 3 09/14/2015 at Unknown time  . mirtazapine (REMERON) 15 MG tablet Take 1 tablet (15 mg total) by mouth at bedtime. 30 tablet 11 09/14/2015 at Unknown time  . Multiple Vitamin (MULTIVITAMIN) tablet Take 1 tablet by mouth daily.     09/14/2015 at Unknown time  . omeprazole (PRILOSEC) 20 MG capsule TAKE 1 CAPSULE EVERY DAY 90 capsule 3 09/14/2015 at Unknown time  . sertraline (ZOLOFT) 100 MG tablet Take 1 tablet (100 mg total) by mouth daily. 90 tablet 3 09/14/2015 at Unknown time  . SUPER B COMPLEX/C PO Take 1 capsule by mouth daily.   09/14/2015 at Unknown time  . doxycycline (VIBRA-TABS) 100 MG tablet Take 1 tablet (100 mg total) by mouth 2 (two) times daily. 20 tablet 0     Current medications: Current Facility-Administered Medications  Medication Dose Route Frequency Provider Last Rate Last Dose  .  0.9 %  sodium chloride infusion  250 mL Intravenous Continuous Skeet Latch, MD      . ALPRAZolam Duanne Moron) tablet 0.25 mg  0.25 mg Oral TID PRN Lance Coon, MD   0.25 mg at 09/19/15 0036  . amiodarone (PACERONE) tablet 400 mg  400 mg Oral BID Skeet Latch, MD   400 mg at 09/19/15 2025  . apixaban (ELIQUIS) tablet 2.5 mg  2.5 mg Oral BID Theodoro Grist, MD   2.5 mg at 09/19/15 0938  . cephALEXin (KEFLEX) capsule 500 mg  500 mg Oral Q12H Theodoro Grist, MD   500 mg at 09/19/15 0936  . cholecalciferol (VITAMIN D) tablet 2,000 Units  2,000 Units Oral Daily Fritzi Mandes, MD   2,000 Units at 09/19/15 [phone]  . donepezil (ARICEPT) tablet 10 mg  10 mg Oral Daily Fritzi Mandes, MD   10 mg at 09/19/15 0937  . memantine (NAMENDA) tablet 5 mg  5 mg Oral BID Fritzi Mandes, MD   5 mg at 09/19/15 6237  . mirtazapine (REMERON) tablet 15 mg  15 mg Oral QHS Fritzi Mandes, MD    15 mg at 09/18/15 2136  . multivitamin with minerals tablet 1 tablet  1 tablet Oral Daily Fritzi Mandes, MD   1 tablet at 09/19/15 [phone]  . ondansetron (ZOFRAN) tablet 4 mg  4 mg Oral Q6H PRN Lance Coon, MD       Or  . ondansetron [facility]) injection 4 mg  4 mg Intravenous Q6H PRN Lance Coon, MD   4 mg at 09/19/15 0115  . pantoprazole (PROTONIX) EC tablet 40 mg  40 mg Oral Daily Fritzi Mandes, MD   40 mg at 09/19/15 0937  . senna (SENOKOT) tablet 8.6 mg  1 tablet Oral QHS Fritzi Mandes, MD   8.6 mg at 09/17/15 2124  . sertraline (ZOLOFT) tablet 100 mg  100 mg Oral Daily Fritzi Mandes, MD   100 mg at 09/19/15 0937  . sodium chloride 0.9 % injection 3 mL  3 mL Intravenous Q12H Skeet Latch, MD   3 mL at 09/19/15 0938  . sodium chloride 0.9 % injection 3 mL  3 mL Intravenous PRN Skeet Latch, MD   3 mL at 09/18/15 1133      Allergies: Allergies  Allergen Reactions  . Ace Inhibitors     REACTION: cough  . Alendronate Sodium     REACTION: aches  . Avapro [Irbesartan]     hypotension  . Codeine     Dizzy and nauseated   . Dabigatran Etexilate Mesylate Diarrhea    indigestion  . Nitrofurantoin     REACTION: rash  . Penicillins     REACTION: rash  . Prednisone Other (See Comments)    Extreme weakness  . Trazodone And Nefazodone Other (See Comments)    Nightmares       Past Medical History: Past Medical History  Diagnosis Date  . Allergic rhinitis   . Anxiety   . Depression   . GERD (gastroesophageal reflux disease)   . Hypertension   . Osteopenia   . Bradycardia     a. s/p PPM  . Hyperlipidemia   . Duodenal ulcer   . Diverticulosis   . GI bleed   . PAF (paroxysmal atrial fibrillation) ([place])     a. s/p TEE/DCCV 08/2015; On Eliquis  . Anemia associated with acute blood loss   . Diverticulosis   . Chronic diastolic CHF (congestive heart failure) ([place])     a. 08/2015: EF 50-55%  Past Surgical History: Past Surgical History  Procedure Laterality Date  .  Appendectomy    . Total abdominal hysterectomy    . Tonsillectomy    . Ankle fracture surgery Right 05/2002  . Insert / replace / remove pacemaker    . Cardiac defibrillator placement    . Esophagogastroduodenoscopy N/A 06/24/2013    Procedure: ESOPHAGOGASTRODUODENOSCOPY (EGD);  Surgeon: Irene Shipper, MD;  Location: [facility];  Service: Endoscopy;  Laterality: N/A;  . Colonoscopy N/A 06/25/2013    Procedure: COLONOSCOPY;  Surgeon: Irene Shipper, MD;  Location: [facility];  Service: Endoscopy;  Laterality: N/A;  . Total hip arthroplasty Left 10/11/2014    DR MURPHY  . Total hip arthroplasty Left 10/11/2014    Procedure: TOTAL HIP ARTHROPLASTY ANTERIOR APPROACH;  Surgeon: Renette Butters, MD;  Location: [place];  Service: Orthopedics;  Laterality: Left;  . Electrophysiologic study N/A 09/15/2015    Procedure: CARDIOVERSION;  Surgeon: Wellington Hampshire, MD;  Location: [facility];  Service: Cardiovascular;  Laterality: N/A;  . Tee without cardioversion N/A 09/15/2015    Procedure: TRANSESOPHAGEAL ECHOCARDIOGRAM (TEE);  Surgeon: Wellington Hampshire, MD;  Location: [facility];  Service: Cardiovascular;  Laterality: N/A;  . Electrophysiologic study N/A 09/18/2015    Procedure: CARDIOVERSION;  Surgeon: Wellington Hampshire, MD;  Location: [facility];  Service: Cardiovascular;  Laterality: N/A;     Family History: Family History  Problem Relation Age of Onset  . Heart attack Father   . Depression Mother   . Alzheimer's disease Mother   . Pneumonia Mother   . Heart disease Brother     hx of CABG     Social History: Social History   Social History  . Marital Status: Widowed    Spouse Name: N/A  . Number of Children: N/A  . Years of Education: N/A   Occupational History  . Not on file.   Social History Main Topics  . Smoking status: Never Smoker   . Smokeless tobacco: Never Used  . Alcohol Use: No     Comment: infrequently-rare  . Drug Use: No  . Sexual Activity: Not on file   Other  Topics Concern  . Not on file   Social History Narrative     Review of Systems: Review of Systems  Unable to perform ROS: dementia    Vital Signs: Blood pressure 116/62, pulse 63, temperature 98.2 F (36.8 C), temperature source Oral, resp. rate 18, height _0  (1.651 m), weight 67.541 kg (148 lb 14.4 oz), SpO2 90 %.  Weight trends: Filed Weights   09/17/15 0552 09/18/15 0437 09/19/15 0425  Weight: 66.271 kg (146 lb 1.6 oz) 68.856 kg (151 lb 12.8 oz) 67.541 kg (148 lb 14.4 oz)    Physical Exam: General: NAD, laying in bed  Head: Normocephalic, atraumatic. Moist oral mucosal membranes  Eyes: Anicteric, PERRL  Neck: Supple, trachea midline  Lungs:  Diminished bilaterally, bilateral crackles  Heart: Regular rate and rhythm  Abdomen:  Soft, nontender,   Extremities: trace peripheral edema.  Neurologic: Nonfocal, moving all four extremities  Skin: No lesions    Lab results: Basic Metabolic Panel:  Recent Labs Lab 09/15/15 1156  09/16/15 0748  09/17/15 0747 09/18/15 0535 09/19/15 0449  NA 141  --  141  142  < > 139 140 137  K 3.1*  < > 3.5  3.5  < > 4.2 3.5 3.9  CL 99*  --  99*  99*  < > 100* 97* 95*  CO2 36*  --  38*  39*  < > 34* 38* 35*  GLUCOSE 121*  --  103*  103*  < > 109* 111* 138*  BUN 29*  --  29*  29*  < > 34* 41* 49*  CREATININE 1.22*  --  1.42*  1.28*  < > 1.50* 1.89* 2.53*  CALCIUM 8.6*  --  8.7*  8.7*  < > 8.6* 8.8* 9.0  MG 1.7  --  1.6*  --   --   --  1.9  PHOS  --   --  4.3  --   --   --   --   < > = values in this interval not displayed.  Liver Function Tests:  Recent Labs Lab 09/16/15 0748  ALBUMIN 2.0*   No results for input(s): LIPASE, AMYLASE in the last 168 hours. No results for input(s): AMMONIA in the last 168 hours.  CBC:  Recent Labs Lab 09/15/15 1156  HGB 13.8    Cardiac Enzymes: No results for input(s): CKTOTAL, CKMB, CKMBINDEX, TROPONINI in the last 168 hours.  BNP: Invalid input(s): POCBNP  CBG: No  results for input(s): GLUCAP in the last 168 hours.  Microbiology: Results for orders placed or performed during the hospital encounter of 09/15/15  Urine culture     Status: None (Preliminary result)   Collection Time: 09/17/15  4:17 PM  Result Value Ref Range Status   Specimen Description URINE, CLEAN CATCH  Final   Special Requests Normal  Final   Culture   Final    80,000 COLONIES/ml GRAM NEGATIVE RODS IDENTIFICATION AND SUSCEPTIBILITIES TO FOLLOW    Report Status PENDING  Incomplete    Coagulation Studies: No results for input(s): LABPROT, INR in the last 72 hours.  Urinalysis:  Recent Labs  09/17/15 1617  COLORURINE BROWN*  LABSPEC 1.022  PHURINE TEST NOT REPORTED DUE TO COLOR INTERFERENCE OF URINE PIGMENT  GLUCOSEU TEST NOT REPORTED DUE TO COLOR INTERFERENCE OF URINE PIGMENT*  HGBUR TEST NOT REPORTED DUE TO COLOR INTERFERENCE OF URINE PIGMENT*  BILIRUBINUR TEST NOT REPORTED DUE TO COLOR INTERFERENCE OF URINE PIGMENT*  KETONESUR TEST NOT REPORTED DUE TO COLOR INTERFERENCE OF URINE PIGMENT*  PROTEINUR TEST NOT REPORTED DUE TO COLOR INTERFERENCE OF URINE PIGMENT*  NITRITE TEST NOT REPORTED DUE TO COLOR INTERFERENCE OF URINE PIGMENT*  LEUKOCYTESUR TEST NOT REPORTED DUE TO COLOR INTERFERENCE OF URINE PIGMENT*      Imaging: Dg Chest 1 View  09/18/2015  CLINICAL DATA:  Shortness of breath . EXAM: CHEST 1 VIEW COMPARISON:  09/17/2015. FINDINGS: Cardiac pacer noted with lead tips in right atrium and right ventricle. Cardiomegaly with pulmonary vascular prominence and bilateral pulmonary interstitial prominence bilateral effusions consistent congestive heart failure. IMPRESSION: 1. Cardiac pacer in stable position. 2. Congestive heart failure with bilateral pulmonary edema and bilateral pleural effusions. No change from prior exam. Electronically Signed   By: Marcello Moores  Register   On: 09/18/2015 09:08   Nm Pulmonary Perf And Vent  09/18/2015  CLINICAL DATA:  82 year old with  tachycardia and acute CHF. Patient underwent cardioversion for atrial fibrillation earlier today. Personal history of pulmonary embolism and DVT. EXAM: NUCLEAR MEDICINE VENTILATION - PERFUSION LUNG SCAN TECHNIQUE: Ventilation images were obtained in multiple projections using inhaled aerosol Tc-15mDTPA. Perfusion images were obtained in multiple projections after intravenous injection of Tc-94mAA. RADIOPHARMACEUTICALS:  32.4 Technetium-9953mPA aerosol inhalation and 4.3 Technetium-56m32m IV COMPARISON:  No prior nuclear imaging. Portable chest x-ray performed earlier same date  is correlated. FINDINGS: Ventilation: Deposition of aerosol in the central bronchi. Patchy ventilation in both lungs, with diminished ventilation in the left lung relative to the right. Perfusion: Triple matched large segmental perfusion defects involving the lower lung zones bilaterally, correlating with the large bilateral pleural effusions, left greater than right, noted on the chest x-ray same day. IMPRESSION: Intermediate probability of pulmonary embolism based on PIOPED II criteria (triple matched large segmental perfusion defects involving the lower lung zones bilaterally). These defects correspond to the large bilateral pleural effusions, left greater than right on the chest x-ray earlier today. Electronically Signed   By: Evangeline Dakin M.D.   On: 09/18/2015 14:52      Assessment & Plan: Ms. BRANDILEE PIES is a 82 y.o.white  female with congestive heart failure, atrial fibrillation, hyperlipidemia, hypertension, who was admitted to [facility] on 09/15/2015   1. Acute renal failure on chronic kidney disease stage III: baseline creatinine of 1.1, eGFR of 49 from 07/13/2015. Does not take an ACE-I/ARB as outpatient.  Acute renal failure from overdiuresis. Currently with metabolic alkalosis. - Recommend holding diuretics for now.  - Monitor urine output, volume status, renal function and serum electrolytes. No acute  indication for dialysis. Renally dose all medications.   2. Hypertension: better control today, hypotensive last two days.  - currently not on any blood pressure agents.   3. Acute exacerbation of congestive heart failure: complicated with bilateral pleural effusions.  - unclear if thoracentesis will be tolerated.  - agree with holding diuretics for now.   4. Urinary tract infection: gram negative rods growing on urine culture. Urinalysis was not sufficiently collected.  - cephalexin empirically.      LOS: 4 Telia Amundson 1/24/201710:59 AM

## 2015-09-19 NOTE — Progress Notes (Signed)
PT Cancellation Note  Patient Details Name: [name] [name] MRN: [id number] DOB: Jun 02, 1923   Cancelled Treatment:    Reason Eval/Treat Not Completed: Medical issues which prohibited therapy (Per chart review, patient noted with pending transfer to CCU due to decline in respiratory status.  Will require new/continue upon transfer orders to resume PT services after transfer to higher level of care.  Please re-consult as medically appropriate.)   Aramis Zobel H. Manson Passey, PT, DPT, NCS 09/19/2015, 11:24 AM [phone]

## 2015-09-20 ENCOUNTER — Inpatient Hospital Stay: Payer: Commercial Managed Care - HMO

## 2015-09-20 ENCOUNTER — Encounter: Payer: Self-pay | Admitting: Student

## 2015-09-20 DIAGNOSIS — R609 Edema, unspecified: Secondary | ICD-10-CM

## 2015-09-20 DIAGNOSIS — J9601 Acute respiratory failure with hypoxia: Secondary | ICD-10-CM | POA: Insufficient documentation

## 2015-09-20 DIAGNOSIS — R000 Tachycardia, unspecified: Secondary | ICD-10-CM

## 2015-09-20 DIAGNOSIS — J9602 Acute respiratory failure with hypercapnia: Secondary | ICD-10-CM

## 2015-09-20 LAB — BLOOD GAS, ARTERIAL
ACID-BASE EXCESS: 7.8 mmol/L — AB (ref 0.0–3.0)
Acid-Base Excess: 5 mmol/L — ABNORMAL HIGH (ref 0.0–3.0)
Allens test (pass/fail): POSITIVE — AB
Allens test (pass/fail): POSITIVE — AB
BICARBONATE: 36.6 meq/L — AB (ref 21.0–28.0)
BICARBONATE: 35.1 meq/L — AB (ref 21.0–28.0)
Delivery systems: POSITIVE
Expiratory PAP: 10
FIO2: 60
FIO2: 0.5
Inspiratory PAP: 18
O2 SAT: 87.2 %
O2 Saturation: 96.5 %
PATIENT TEMPERATURE: 37
PATIENT TEMPERATURE: 37
PCO2 ART: 71 mmHg — AB (ref 32.0–48.0)
PH ART: 7.32 — AB (ref 7.350–7.450)
PO2 ART: 92 mmHg (ref 83.0–108.0)
PO2 ART: 62 mmHg — AB (ref 83.0–108.0)
RATE: 10 resp/min
pCO2 arterial: 80 mmHg (ref 32.0–48.0)
pH, Arterial: 7.25 — ABNORMAL LOW (ref 7.350–7.450)

## 2015-09-20 LAB — CBC
HCT: 41.4 % (ref 35.0–47.0)
HEMOGLOBIN: 13.2 g/dL (ref 12.0–16.0)
MCH: 30 pg (ref 26.0–34.0)
MCHC: 31.8 g/dL — ABNORMAL LOW (ref 32.0–36.0)
MCV: 94.3 fL (ref 80.0–100.0)
Platelets: 234 10*3/uL (ref 150–440)
RBC: 4.39 MIL/uL (ref 3.80–5.20)
RDW: 16.6 % — ABNORMAL HIGH (ref 11.5–14.5)
WBC: 9.7 10*3/uL (ref 3.6–11.0)

## 2015-09-20 LAB — BASIC METABOLIC PANEL
Anion gap: 9 (ref 5–15)
BUN: 62 mg/dL — AB (ref 6–20)
CO2: 37 mmol/L — ABNORMAL HIGH (ref 22–32)
CREATININE: 3.66 mg/dL — AB (ref 0.44–1.00)
Calcium: 9.1 mg/dL (ref 8.9–10.3)
Chloride: 93 mmol/L — ABNORMAL LOW (ref 101–111)
GFR calc Af Amer: 12 mL/min — ABNORMAL LOW (ref >60)
GFR, EST NON AFRICAN AMERICAN: 10 mL/min — AB (ref >60)
Glucose, Bld: 98 mg/dL (ref 65–99)
Potassium: 4.2 mmol/L (ref 3.5–5.1)
SODIUM: 139 mmol/L (ref 135–145)

## 2015-09-20 LAB — APTT: APTT: 44 s — AB (ref 24–36)

## 2015-09-20 LAB — CK: Total CK: 29 U/L — ABNORMAL LOW (ref 38–234)

## 2015-09-20 LAB — HEPARIN LEVEL (UNFRACTIONATED): HEPARIN UNFRACTIONATED: 2.48 [IU]/mL — AB (ref 0.30–0.70)

## 2015-09-20 MED ORDER — CETYLPYRIDINIUM CHLORIDE 0.05 % MT LIQD
7.0000 mL | Freq: Two times a day (BID) | OROMUCOSAL | Status: DC
  Administered 2015-09-20 – 2015-09-21 (×4): 7 mL via OROMUCOSAL

## 2015-09-20 MED ORDER — CHLORHEXIDINE GLUCONATE 0.12 % MT SOLN
15.0000 mL | Freq: Two times a day (BID) | OROMUCOSAL | Status: DC
  Administered 2015-09-20 – 2015-09-21 (×5): 15 mL via OROMUCOSAL
  Filled 2015-09-20 (×3): qty 15

## 2015-09-20 MED ORDER — CEFAZOLIN SODIUM 1 G IJ SOLR
500.0000 mg | Freq: Two times a day (BID) | INTRAMUSCULAR | Status: DC
  Administered 2015-09-20 – 2015-09-21 (×3): 500 mg via INTRAVENOUS
  Filled 2015-09-20 (×4): qty 5

## 2015-09-20 MED ORDER — AMIODARONE HCL 200 MG PO TABS
200.0000 mg | ORAL_TABLET | Freq: Every day | ORAL | Status: DC
  Administered 2015-09-20 – 2015-09-21 (×2): 200 mg via ORAL
  Filled 2015-09-20 (×2): qty 1

## 2015-09-20 NOTE — Progress Notes (Signed)
Patient Name: [name] [name] Date of Encounter: 09/20/2015  Principal Problem:   Atrial fibrillation with RVR (HCC) Active Problems:   Acute on chronic diastolic (congestive) heart failure (HCC)   Hypoxia   Encephalopathy acute   Lethargic   Primary Cardiologist: Dr. Graciela Husbands Patient Profile: 82F w/ PMH of persistent atrial fibrillation, tachy-brady syndrome (s/p PPM), GIB, admitted on 09/15/2015 with hypoxic respiratory failure and acute on chronic diastolic heart failure. Underwent repeat DCCV on 09/18/2015. Was lethargic the following morning and noted to have respiratory acidosis requiring transfer to the ICU and being placed on BiPAP.  SUBJECTIVE: More alert, sitting up in her recliner. Responding to questions. Reporting mild dyspnea, denies any pain currently. Consumed a small amount of ice cream today.   OBJECTIVE Filed Vitals:   09/20/15 1140 09/20/15 1200 09/20/15 1300 09/20/15 1400  BP:  160/137 126/71 119/65  Pulse:  70 73 70  Temp:  98.1 F (36.7 C)    TempSrc:      Resp:  Height:      Weight:      SpO2: 96% 90% 93% 92%    Intake/Output Summary (Last 24 hours) at 09/20/15 1451 Last data filed at 09/20/15 0700  Gross per 24 hour  Intake 184.59 ml  Output    200 ml  Net -15.41 ml   Filed Weights   09/18/15 0437 09/19/15 0425 09/20/15 0500  Weight: 151 lb 12.8 oz (68.856 kg) 148 lb 14.4 oz (67.541 kg) 149 lb 14.6 oz (68 kg)    PHYSICAL EXAM General: Well developed, well nourished, female in no acute distress. Foley bag showed blood-tinged urine. Head: Normocephalic, atraumatic.  Neck: Supple without bruits, JVD not elevated. Lungs:  Resp regular and unlabored, Decreased breath sounds at bases bilaterally. Rales present. On high-flow nasal cannula currently. Heart: RRR, S1, S2, no S3, S4, or murmur; no rub. Abdomen: Soft, non-tender, non-distended with normoactive bowel sounds. No hepatomegaly. No rebound/guarding. No obvious abdominal  masses. Extremities: No clubbing or cyanosis. 1+ edema up to mid-shins bilaterally. Distal pedal pulses are 2+ bilaterally. Neuro: Alert and oriented X 3. Moves all extremities spontaneously. Psych: Normal affect.   LABS: CBC: Recent Labs  09/19/15 1936 09/20/15 0905  WBC 8.8 9.7  HGB 14.1 13.2  HCT 45.2 41.4  MCV 98.0 94.3  PLT 232 234   INR: Recent Labs  09/19/15 1936  INR 1.51   Basic Metabolic Panel: Recent Labs  09/19/15 0449 09/19/15 1936 09/20/15 1239  NA 137 138 139  K 3.9 4.5 4.2  CL 95* 95* 93*  CO2 35* 35* 37*  GLUCOSE 138* 117* 98  BUN 49* 53* 62*  CREATININE 2.53* 3.00* 3.66*  CALCIUM 9.0 8.9 9.1  MG 1.9  --   --    Liver Function Tests: Recent Labs  09/19/15 1936  AST 60*  ALT 31  ALKPHOS 117  BILITOT 0.5  PROT 5.7*  ALBUMIN 2.1*   Cardiac Enzymes: Recent Labs  09/20/15 0856  CKTOTAL 29*   BNP: No results found for: BNP PRO B NATRIURETIC PEPTIDE (BNP)  Date/Time Value Ref Range Status  08/08/2015 10:54 AM 946.0* 0.0 - 100.0 pg/mL Final    TELE: NSR, HR in 60's - 70's.        TEE: 09/15/2015 Study Conclusions - Left ventricle: Hypertrophy was noted. Systolic function was normal. The estimated ejection fraction was in the range of 50% to 55%. - Mitral valve: Mildly calcified annulus. Moderately thickened leaflets .  There was mild regurgitation. - Left atrium: The atrium was moderately dilated. No evidence of thrombus in the atrial cavity or appendage. There was severecontinuous spontaneous echo contrast (&quot;smoke&quot;) in the cavity and the appendage. The appendage was morphologically a left appendage, multilobulated, and moderately dilated. Emptying velocity was severely reduced. - Right atrium: No evidence of thrombus in the atrial cavity or appendage.  Radiology/Studies: Dg Chest Port 1 View: 09/20/2015  CLINICAL DATA:  Acute respiratory failure, shortness of Breath EXAM: PORTABLE CHEST 1 VIEW  COMPARISON:  1/23/ 17 FINDINGS: Cardiomegaly again noted. Persistent mild congestion/ edema. Bilateral small pleural effusion with bilateral basilar atelectasis or infiltrate. Slight increase in right pleural effusion. Dual lead cardiac pacemaker is unchanged in position. IMPRESSION: Persistent mild congestion/ edema. Bilateral small pleural effusion with bilateral basilar atelectasis or infiltrate. Slight increased right pleural effusion. Dual lead cardiac pacemaker is unchanged in position. Electronically Signed   By: Natasha Mead M.D.   On: 09/20/2015 08:54    Current Medications:  . amiodarone  200 mg Oral Daily  . antiseptic oral rinse  7 mL Mouth Rinse q12n4p  .  ceFAZolin (ANCEF) IV  500 mg Intravenous Q12H  . chlorhexidine  15 mL Mouth Rinse BID  . cholecalciferol  2,000 Units Oral Daily  . donepezil  10 mg Oral Daily  . memantine  5 mg Oral BID  . mirtazapine  15 mg Oral QHS  . multivitamin with minerals  1 tablet Oral Daily  . pantoprazole  40 mg Oral Daily  . senna  1 tablet Oral QHS  . sertraline  100 mg Oral Daily  . sodium chloride  3 mL Intravenous Q12H   . sodium chloride      ASSESSMENT AND PLAN:  1. Paroxysmal Atrial fibrillation with RVR - underwent TEE/DCCV on 09/15/2015 with return to NSR for approximately one hour but unfortunately went back into atrial fibrillation with RVR  - Repeat DCCV on 09/18/2015, now on Amiodarone drip. Maintaining NSR. - Metoprolol held for hypotension.  - CHA2DS2-VASc Score and unadjusted Ischemic Stroke Rate (% per year) is equal to 7.2 % stroke rate/year from a score of 5 (CHF, HTN, Age (2), Female). Was switched to Heparin in anticipation of thoracentesis. Foley bag demonstrates Hematuria. Will hold Heparin at this time.  2. Acute on Chronic Diastolic CHF  - Creatinine became elevated on lasix and UTI, now held - still with lower extremity edema and persistent bilateral pleural effusions - may need thoracentesis for worsening dyspnea  and bilateral pleural effusions  3. Tachy-brady Syndrome - s/p PPM placement.  4. Stage 3 CKD - Creatinine continues to trend upward. 1.89 on 09/18/2015, 3.66 on 09/19/2015.  - Nephrology following. The family wishes to pursue dialysis if necessary.  5. Bilateral Pleural effusions - Moderate in size on x-ray and VQ scan. Contributing to her shortness of breath symptoms. - Pulmonology not favoring a thoracentesis at this time due to improvement this morning. However, she had worsening respiratory symptoms this afternoon requiring BiPAP. Will continue to monitor.  6. Acute Respiratory Failure - hypoxic, lethargic, and with altered mental status on 09/19/2015. Was placed on BiPAP was transitioned to high-flow nasal cannula earlier today.  - In the room when repeat ABG was obtained. CO2 at 80 with pH of 7.25. Placed back on BiPAP.  Signed, Ellsworth Lennox , PA-C 2:51 PM 09/20/2015 Pager: [phone]   Attending Note Patient seen and examined, agree with detailed note above,  Patient presentation and plan discussed on rounds.  patient is increasingly alert today,  Weaned from BiPAP to nasal cannula high flow,  Unfortunately PCO2 has increased from 70 up to 80,  Started back on BiPAP   Family reports she is less lethargic today, still significant respiratory distress  Long discussion with family concerning her medical issues which include worsening renal failure , hematuria, respiratory distress, pleural effusions   She is maintaining normal sinus rhythm  recommend we continue amiodarone infusion  Given gross hematuria, would hold heparin for now  Hematuria possibly exacerbated by heparin infusion , still with eliquis  In her system   EKG lab work, chest x-ray, echocardiogram reviewed independently by myself Greater than 50% was spent in counseling and coordination of care with patient Total encounter time 35 minutes or more   Signed: Dossie Arbour  M.D., Ph.D. [facility]  [facility]

## 2015-09-20 NOTE — Progress Notes (Signed)
Notified Dr. Dema Severin, Dr. Seth Bake and Dr. Mariah Milling about patient's urine output 15ml at this time and bloody- recent creatine 3. Per Dr. Mariah Milling discontinue heparin drip.

## 2015-09-20 NOTE — Progress Notes (Signed)
Patient placed back on bipap due to worsening ABG on high flow.

## 2015-09-20 NOTE — Progress Notes (Signed)
Patient atrial paced upon first assessment of this RN's shift, converted back to Afib around 2200,  HR 90's-110.  On PO Amiodarone currently, notified MD. Stated to continue to monitor patient unless HR stays above 110 consistently.  Will continue to monitor.

## 2015-09-20 NOTE — Care Management (Signed)
Patient had elective cardioversion 1/20 and converted to nsr but was short lived.  Patient required amiodarone drip, became very short of breath and required transfer to icu due to need for continuous bipap.  She is currently on HFNC at 60%.  She was started on new Eliquis this admission but it is currently on hold .  Nephrology is following for acute renal failure thought to be from over diuresis.  Diuretics on hold.  No need for acute dialysis at present time but family has indicated if it is needed would like to pursue

## 2015-09-20 NOTE — Progress Notes (Signed)
Chaplain rounded in the unit and provided a compassionate presence to the patient with silent prayer. Jefm Petty [phone]

## 2015-09-20 NOTE — Consult Note (Signed)
MEDICATION RELATED CONSULT NOTE - Follow Up  Pharmacy Consult for Potassium Replacement Indication: Hypokalemia  Patient Measurements: Height:  (165.1 cm) Weight: 149 lb 14.6 oz (68 kg) IBW/kg (Calculated) : 57  Vital Signs: Temp: 97.9 F (36.6 C) (01/25 1500) BP: 116/59 mmHg (01/25 1500) Pulse Rate: 66 (01/25 1500) Intake/Output from previous day: 01/24 0701 - 01/25 0700 In: 424.6 [P.O.:240; I.V.:184.6] Out: 200 [Urine:200] Intake/Output from this shift:    Labs:  Recent Labs  09/19/15 0449 09/19/15 1936 09/20/15 0905 09/20/15 1239  WBC  --  8.8 9.7  --   HGB  --  14.1 13.2  --   HCT  --  45.2 41.4  --   PLT  --  232 234  --   APTT  --  35 44*  --   CREATININE 2.53* 3.00*  --  3.66*  MG 1.9  --   --   --   ALBUMIN  --  2.1*  --   --   PROT  --  5.7*  --   --   AST  --  60*  --   --   ALT  --  31  --   --   ALKPHOS  --  117  --   --   BILITOT  --  0.5  --   --    Estimated Creatinine Clearance: 9.2 mL/min (by C-G formula based on Cr of 3.66).  Assessment: [name] [name] is a 82yo female admitted with acute congestive heart failure and chronic afib. Pharmacy consulted to replete potassium in this patient.   Plan:  No additional supplement ordered at this time.   Will recheck with AM labs.   Simpson,Michael L, PharmD Clinical Pharmacist 09/20/2015,4:14 PM

## 2015-09-20 NOTE — Progress Notes (Signed)
Update: ABG reviewed - 7.25/80 - this was on HFNC, will place patient back on Bipap.   Stephanie Acre, MD [facility] Pager [phone] (please enter 7-digits) On Call Pager - [phone] (please enter 7-digits)

## 2015-09-20 NOTE — Progress Notes (Signed)
[facility] [place] at [facility]   PATIENT NAME: [name] [name]    MR#:  [id number]  DATE OF BIRTH:  1922-11-19  SUBJECTIVE: I feel good  CHIEF COMPLAINT:  No chief complaint on file.  patient is a 82 year old Caucasian female with past medical history significant for history of atrial fibrillation for which she underwent elective cardioversion with Dr. Kirke Corin. She remained in sinus rhythm for about one hour and then converted back into atrial fibrillation, became short of breath and presented  to the hospital hypoxic and in CHF. She was diuresed approximately 700 cc since admission. Patient was loaded with amiodarone and underwent elective cardioversion 23rd of January again, converted into sinus rhythm and remains in sinus rhythm. Poorly responsive yesterday, , dyspneic, stenotic, respiratory acidosis, transferred to CCU for BiPAP. Weaned off BiPAP today and now she is on high flow nasal cannulas, feels comfortable. She has poor urinary output, patient's family would be interested in hemodialysis if needed, per Dr Wynelle Link. Foley catheter was placed and patient's urine is bloody, minimal amount     Review of Systems  Unable to perform ROS: acuity of condition    VITAL SIGNS: Blood pressure 117/63, pulse 60, temperature 98.2 F (36.8 C), temperature source Rectal, resp. rate 18, height  (1.651 m), weight 68 kg (149 lb 14.6 oz), SpO2 96 %.  PHYSICAL EXAMINATION:   GENERAL:  82 y.o.-year-old patient lying in the bed in moderate respiratory distress. Remains Tachypneic, but more comfortable with high flow nasal cannulas , restless in bed  EYES: Pupils equal, round, reactive to light and accommodation. No scleral icterus. Extraocular muscles intact.  HEENT: Head atraumatic, normocephalic. Oropharynx and nasopharynx clear.  NECK:  Supple, no jugular venous distention. No thyroid enlargement, no tenderness.  LUNGS: Diminished breath sounds bilaterally at bases,  no wheezing, bilateral crepitations anteriorly as well as posteriorly. Using accessory muscles of respiration at rest. Tachypneic. Diminished breath sounds at bases . Less crackles overall today  CARDIOVASCULAR: S1, S2 , regular. No murmurs, rubs, or gallops.  ABDOMEN: Soft, nontender, nondistended. Bowel sounds present. No organomegaly or mass.  EXTREMITIES: 2+ lower extremity and pedal edema, no cyanosis, or clubbing.  NEUROLOGIC: Cranial nerves II through XII are intact. Muscle strength 5/5 in all extremities. Sensation intact. Gait not checked.  PSYCHIATRIC: The patient is alert ,  uncomfortable , restless.some dyspneic   SKIN: No obvious rash, lesion, or ulcer.   ORDERS/RESULTS REVIEWED:   CBC  Recent Labs Lab 09/15/15 1156 09/19/15 1936 09/20/15 0905  WBC  --  8.8 9.7  HGB 13.8 14.1 13.2  HCT  --  45.2 41.4  PLT  --  232 234  MCV  --  98.0 94.3  MCH  --  30.7 30.0  MCHC  --  31.3* 31.8*  RDW  --  17.2* 16.6*   ------------------------------------------------------------------------------------------------------------------  Chemistries   Recent Labs Lab 09/15/15 1156  09/16/15 0748  09/17/15 0747 09/18/15 0535 09/19/15 0449 09/19/15 1936 09/20/15 1239  NA 141  --  141  142  < > 139 140 137 138 139  K 3.1*  < > 3.5  3.5  < > 4.2 3.5 3.9 4.5 4.2  CL 99*  --  99*  99*  < > 100* 97* 95* 95* 93*  CO2 36*  --  38*  39*  < > 34* 38* 35* 35* 37*  GLUCOSE 121*  --  103*  103*  < > 109* 111* 138* 117* 98  BUN  29*  --  29*  29*  < > 34* 41* 49* 53* 62*  CREATININE 1.22*  --  1.42*  1.28*  < > 1.50* 1.89* 2.53* 3.00* 3.66*  CALCIUM 8.6*  --  8.7*  8.7*  < > 8.6* 8.8* 9.0 8.9 9.1  MG 1.7  --  1.6*  --   --   --  1.9  --   --   AST  --   --   --   --   --   --   --  60*  --   ALT  --   --   --   --   --   --   --  31  --   ALKPHOS  --   --   --   --   --   --   --  117  --   BILITOT  --   --   --   --   --   --   --  0.5  --   < > = values in this interval not  displayed. ------------------------------------------------------------------------------------------------------------------ estimated creatinine clearance is 9.2 mL/min (by C-G formula based on Cr of 3.66). ------------------------------------------------------------------------------------------------------------------ No results for input(s): TSH, T4TOTAL, T3FREE, THYROIDAB in the last 72 hours.  Invalid input(s): FREET3  Cardiac Enzymes No results for input(s): CKMB, TROPONINI, MYOGLOBIN in the last 168 hours.  Invalid input(s): CK ------------------------------------------------------------------------------------------------------------------ Invalid input(s): POCBNP ---------------------------------------------------------------------------------------------------------------  RADIOLOGY: Nm Pulmonary Perf And Vent  09/18/2015  CLINICAL DATA:  82 year old with tachycardia and acute CHF. Patient underwent cardioversion for atrial fibrillation earlier today. Personal history of pulmonary embolism and DVT. EXAM: NUCLEAR MEDICINE VENTILATION - PERFUSION LUNG SCAN TECHNIQUE: Ventilation images were obtained in multiple projections using inhaled aerosol Tc-17m DTPA. Perfusion images were obtained in multiple projections after intravenous injection of Tc-75m MAA. RADIOPHARMACEUTICALS:  32.4 Technetium-44m DTPA aerosol inhalation and 4.3 Technetium-57m MAA IV COMPARISON:  No prior nuclear imaging. Portable chest x-ray performed earlier same date is correlated. FINDINGS: Ventilation: Deposition of aerosol in the central bronchi. Patchy ventilation in both lungs, with diminished ventilation in the left lung relative to the right. Perfusion: Triple matched large segmental perfusion defects involving the lower lung zones bilaterally, correlating with the large bilateral pleural effusions, left greater than right, noted on the chest x-ray same day. IMPRESSION: Intermediate probability of pulmonary  embolism based on PIOPED II criteria (triple matched large segmental perfusion defects involving the lower lung zones bilaterally). These defects correspond to the large bilateral pleural effusions, left greater than right on the chest x-ray earlier today. Electronically Signed   By: Hulan Saas M.D.   On: 09/18/2015 14:52   Dg Chest Port 1 View  09/20/2015  CLINICAL DATA:  Acute respiratory failure, shortness of Breath EXAM: PORTABLE CHEST 1 VIEW COMPARISON:  1/23/ 17 FINDINGS: Cardiomegaly again noted. Persistent mild congestion/ edema. Bilateral small pleural effusion with bilateral basilar atelectasis or infiltrate. Slight increase in right pleural effusion. Dual lead cardiac pacemaker is unchanged in position. IMPRESSION: Persistent mild congestion/ edema. Bilateral small pleural effusion with bilateral basilar atelectasis or infiltrate. Slight increased right pleural effusion. Dual lead cardiac pacemaker is unchanged in position. Electronically Signed   By: Natasha Mead M.D.   On: 09/20/2015 08:54    EKG:  Orders placed or performed during the hospital encounter of 09/15/15  . EKG 12-Lead pre-cardioversion  . EKG 12-Lead  . EKG 12-Lead pre-cardioversion  . EKG 12-Lead  . EKG 12-Lead  . EKG 12-Lead  .  EKG 12-Lead  . EKG 12-Lead  . EKG 12-Lead  . EKG 12-Lead    ASSESSMENT AND PLAN:  Principal Problem:   Atrial fibrillation with RVR (HCC) Active Problems:   Acute on chronic diastolic (congestive) heart failure (HCC)   Hypoxia   Encephalopathy acute   Lethargic  #1 atrial fibrillation, RVR  status post TEE and elective cardioversion 2,  patient is in sinus rhythm now, has been loaded with  amiodarone , continue amiodarone orally, cardiologist is following. Patient's heart rate is in 50s to 60s . Patient has underlying sinus rhythm when she is not paced . Off  Eliquis on heparin IV now. Venous ultrasound was negative for DVT.  #2. Acute on chronic diastolic CHF due to atrial  fibrillation, of Lasix due to acute on chronic renal failure,  metoprolol is on hold, resume her  medications when blood pressure remains stable. Repeated chest x-ray reveals pulmonary edema, bilateral pleural effusions.  VQ scan intermediate probability for pulmonary embolism. I do not recommend thoracentesis since it is related to acute on chronic diastolic CHF .   #3. Lower extremity edema, Doppler ultrasound revealed no evidence of DVT #4. Acute on chronic renal failure on underlying chronic stage III renal insufficiency, patient is off diuretics now. Poor urinary output and creatinine is climbing today is 3.66 with estimated GFR as of 10  #5. Dementia with behavioral problems. Supportive therapy  #6 generalized weakness, physical therapy recommends skilled nursing facility rehabilitation placement, getting social work  involved #7.  urinary tract infection , Urinary cultures revealed 80,000 colony-forming units of Proteus mirabilis, sensitive to Keflex orally, continue Keflex for now  #8. Acute respiratory failure with hypoxia  and hypercapnia due to acute on chronic diastolic CHF, now  Off BiPAP and on the high flow nasal cannulas , wean off oxygen as tolerated   #9 hematuria, hold heparin for now until hematuria clears. Follow hemoglobin  Management plans discussed with the patient, family and they are in agreement.   DRUG ALLERGIES:  Allergies  Allergen Reactions  . Ace Inhibitors     REACTION: cough  . Alendronate Sodium     REACTION: aches  . Avapro [Irbesartan]     hypotension  . Codeine     Dizzy and nauseated   . Dabigatran Etexilate Mesylate Diarrhea    indigestion  . Nitrofurantoin     REACTION: rash  . Penicillins     REACTION: rash  . Prednisone Other (See Comments)    Extreme weakness  . Trazodone And Nefazodone Other (See Comments)    Nightmares     CODE STATUS:     Code Status Orders        Start     Ordered   09/15/15 1415  Do not attempt resuscitation  (DNR)   Continuous    Question Answer Comment  In the event of cardiac or respiratory ARREST Do not call a "code blue"   In the event of cardiac or respiratory ARREST Do not perform Intubation, CPR, defibrillation or ACLS   In the event of cardiac or respiratory ARREST Use medication by any route, position, wound care, and other measures to relive pain and suffering. May use oxygen, suction and manual treatment of airway obstruction as needed for comfort.      09/15/15 1414    Code Status History    Date Active Date Inactive Code Status Order ID Comments User Context   10/11/2014  1:12 PM 10/12/2014 11:05 PM Full Code [id number]  Sheral Apley, MD Inpatient   06/23/2013  4:37 PM 06/26/2013  7:15 PM Full Code [id number]  Esperanza Sheets, MD Inpatient    Advance Directive Documentation        Most Recent Value   Type of Advance Directive  Healthcare Power of Attorney, Living will   Pre-existing out of facility DNR order (yellow form or pink MOST form)     "MOST" Form in Place?        TOTAL CRITICAL CARE TIME TAKING CARE OF THIS PATIENT:  45 minutes.   discussed this patient's daughter , nephrologist, as well as intensive care specialist  Chales Pelissier M.D on 09/20/2015 at 2:11 PM  Between 7am to 6pm - Pager - [phone]  After 6pm go to [email or web address] - password [email or web address] [facility]  [facility]  Office  [phone]  CC: Primary care physician; Roxy Manns, MD

## 2015-09-20 NOTE — Progress Notes (Signed)
ANTICOAGULATION CONSULT NOTE - Follow-Up  Pharmacy Consult for Heparin  Indication: atrial fibrillation  Allergies  Allergen Reactions  . Ace Inhibitors     REACTION: cough  . Alendronate Sodium     REACTION: aches  . Avapro [Irbesartan]     hypotension  . Codeine     Dizzy and nauseated   . Dabigatran Etexilate Mesylate Diarrhea    indigestion  . Nitrofurantoin     REACTION: rash  . Penicillins     REACTION: rash  . Prednisone Other (See Comments)    Extreme weakness  . Trazodone And Nefazodone Other (See Comments)    Nightmares     Patient Measurements: Height:  (165.1 cm) Weight: 149 lb 14.6 oz (68 kg) IBW/kg (Calculated) : 57 Heparin Dosing Weight:   Vital Signs: Temp: 97.9 F (36.6 C) (01/25 1500) BP: 116/59 mmHg (01/25 1500) Pulse Rate: 66 (01/25 1500)  Labs:  Recent Labs  09/19/15 0449 09/19/15 1936 09/20/15 0856 09/20/15 0905 09/20/15 1239  HGB  --  14.1  --  13.2  --   HCT  --  45.2  --  41.4  --   PLT  --  232  --  234  --   APTT  --  35  --  44*  --   LABPROT  --  18.3*  --   --   --   INR  --  1.51  --   --   --   HEPARINUNFRC  --  2.00*  --  2.48*  --   CREATININE 2.53* 3.00*  --   --  3.66*  CKTOTAL  --   --  29*  --   --     Estimated Creatinine Clearance: 9.2 mL/min (by C-G formula based on Cr of 3.66).   Medical History: Past Medical History  Diagnosis Date  . Allergic rhinitis   . Anxiety   . Depression   . GERD (gastroesophageal reflux disease)   . Hypertension   . Osteopenia   . Bradycardia     a. s/p PPM  . Hyperlipidemia   . Duodenal ulcer   . Diverticulosis   . GI bleed   . PAF (paroxysmal atrial fibrillation) (HCC)     a. s/p TEE/DCCV 08/2015; On Eliquis  . Anemia associated with acute blood loss   . Diverticulosis   . Chronic diastolic CHF (congestive heart failure) (HCC)     a. 08/2015: EF 50-55%    Medications:  Prescriptions prior to admission  Medication Sig Dispense Refill Last Dose  .  amiodarone (PACERONE) 400 MG tablet Take 1 tablet (400 mg total) by mouth 2 (two) times daily. 60 tablet 1 09/14/2015 at Unknown time  . apixaban (ELIQUIS) 2.5 MG TABS tablet Take 1 tablet (2.5 mg total) by mouth 2 (two) times daily. 60 tablet 11 09/14/2015 at Unknown time  . Cholecalciferol (VITAMIN D3) 2000 UNITS TABS Take 1 capsule by mouth daily.   09/14/2015 at Unknown time  . [employer] SENNA 8.6 MG tablet TAKE 2 TABLETS BY MOUTH TWICE A DAY AT 8AM AND ALSP AT 5PM 120 tablet 11 09/14/2015 at Unknown time  . donepezil (ARICEPT) 10 MG tablet Take 1 tablet by mouth daily.   09/14/2015 at Unknown time  . furosemide (LASIX) 40 MG tablet Take one tablet (40 mg) by mouth x 7 days as directed 30 tablet 0 09/14/2015 at Unknown time  . memantine (NAMENDA) 5 MG tablet Take 5 mg by mouth 2 (two) times  daily.  3 09/14/2015 at Unknown time  . metoprolol tartrate (LOPRESSOR) 25 MG tablet Take 1 tablet (25 mg total) by mouth 2 (two) times daily. 180 tablet 3 09/14/2015 at Unknown time  . mirtazapine (REMERON) 15 MG tablet Take 1 tablet (15 mg total) by mouth at bedtime. 30 tablet 11 09/14/2015 at Unknown time  . Multiple Vitamin (MULTIVITAMIN) tablet Take 1 tablet by mouth daily.     09/14/2015 at Unknown time  . omeprazole (PRILOSEC) 20 MG capsule TAKE 1 CAPSULE EVERY DAY 90 capsule 3 09/14/2015 at Unknown time  . sertraline (ZOLOFT) 100 MG tablet Take 1 tablet (100 mg total) by mouth daily. 90 tablet 3 09/14/2015 at Unknown time  . SUPER B COMPLEX/C PO Take 1 capsule by mouth daily.   09/14/2015 at Unknown time  . doxycycline (VIBRA-TABS) 100 MG tablet Take 1 tablet (100 mg total) by mouth 2 (two) times daily. 20 tablet 0    Scheduled:  . amiodarone  200 mg Oral Daily  . antiseptic oral rinse  7 mL Mouth Rinse q12n4p  .  ceFAZolin (ANCEF) IV  500 mg Intravenous Q12H  . chlorhexidine  15 mL Mouth Rinse BID  . cholecalciferol  2,000 Units Oral Daily  . donepezil  10 mg Oral Daily  . memantine  5 mg Oral BID  . mirtazapine   15 mg Oral QHS  . multivitamin with minerals  1 tablet Oral Daily  . pantoprazole  40 mg Oral Daily  . senna  1 tablet Oral QHS  . sertraline  100 mg Oral Daily  . sodium chloride  3 mL Intravenous Q12H    Assessment: Pharmacy consulted to assist with the dosing and monitoring of heparin therapy in this 82 year old female being anticoagulated for her atrial fibrillation. Patient is chronically on apixaban and received her last dose of apixaban on 1/24 @ 09:38.   Patient currently receiving heparin 950 units/hr.     Goal of Therapy:  Heparin level 0.3-0.7 units/ml Monitor platelets by anticoagulation protocol: Yes   Plan:  Will increase patient to heparin 1050 units/hr. Will not bolus at this time and continue conservative management. Will obtain follow-up APTT at 2200. Will obtain Anti-Xa with am labs.   Pharmacy will continue to monitor and adjust per consult.  Pharmacy to follow  Darivs Lunden L 09/20/2015,4:10 PM

## 2015-09-20 NOTE — Progress Notes (Signed)
[facility]  ROUNDING NOTE   Subjective:   Transferred to ICU. Now on BiPap Son and daughter are at bedside. States they would want patient to get dialysis.  UOP 200  Amiodarone gtt Holding diuretics.   Objective:  Vital signs in last 24 hours:  Temp:  [96.1 F (35.6 C)-99.3 F (37.4 C)] 98.2 F (36.8 C) (01/25 0800) Pulse Rate:  [35-123] 60 (01/25 0800) Resp:  [13-22] 18 (01/25 0800) BP: (80-149)/(48-82) 117/63 mmHg (01/25 0800) SpO2:  [85 %-100 %] 99 % (01/25 0811) FiO2 (%):  [60 %-100 %] 60 % (01/25 0741) Weight:  [68 kg (149 lb 14.6 oz)] 68 kg (149 lb 14.6 oz) (01/25 0500)  Weight change: 0.459 kg (1 lb 0.2 oz) Filed Weights   09/18/15 0437 09/19/15 0425 09/20/15 0500  Weight: 68.856 kg (151 lb 12.8 oz) 67.541 kg (148 lb 14.4 oz) 68 kg (149 lb 14.6 oz)    Intake/Output: I/O last 3 completed shifts: In: 427.6 [P.O.:240; I.V.:187.6] Out: 250 [Urine:250]   Intake/Output this shift:     Physical Exam: General: Critically ill   Head: Normocephalic, atraumatic. +BIPAP  Eyes: Anicteric, PERRL  Neck: Supple, trachea midline, +JVD  Lungs:  Bilateral crackles  Heart: Paced rhythm  Abdomen:  Soft, nontender,   Extremities: 1+ peripheral edema.  Neurologic: Lethargic, answering questions  Skin: No lesions  Access: none    Basic Metabolic Panel:  Recent Labs Lab 09/15/15 1156  09/16/15 0748 09/17/15 0451 09/17/15 0747 09/18/15 0535 09/19/15 0449 09/19/15 1936  NA 141  --  141  142 141 139 140 137 138  K 3.1*  < > 3.5  3.5 3.6 4.2 3.5 3.9 4.5  CL 99*  --  99*  99* 98* 100* 97* 95* 95*  CO2 36*  --  38*  39* 37* 34* 38* 35* 35*  GLUCOSE 121*  --  103*  103* 114* 109* 111* 138* 117*  BUN 29*  --  29*  29* 34* 34* 41* 49* 53*  CREATININE 1.22*  --  1.42*  1.28* 1.46* 1.50* 1.89* 2.53* 3.00*  CALCIUM 8.6*  --  8.7*  8.7* 9.0 8.6* 8.8* 9.0 8.9  MG 1.7  --  1.6*  --   --   --  1.9  --   PHOS  --   --  4.3  --   --   --   --   --   < > =  values in this interval not displayed.  Liver Function Tests:  Recent Labs Lab 09/16/15 0748 09/19/15 1936  AST  --  60*  ALT  --  31  ALKPHOS  --  117  BILITOT  --  0.5  PROT  --  5.7*  ALBUMIN 2.0* 2.1*   No results for input(s): LIPASE, AMYLASE in the last 168 hours. No results for input(s): AMMONIA in the last 168 hours.  CBC:  Recent Labs Lab 09/15/15 1156 09/19/15 1936 09/20/15 0905  WBC  --  8.8 9.7  HGB 13.8 14.1 13.2  HCT  --  45.2 41.4  MCV  --  98.0 94.3  PLT  --  232 234    Cardiac Enzymes:  Recent Labs Lab 09/20/15 0856  CKTOTAL 29*    BNP: Invalid input(s): POCBNP  CBG: No results for input(s): GLUCAP in the last 168 hours.  Microbiology: Results for orders placed or performed during the hospital encounter of 09/15/15  Urine culture     Status: None     Collection Time: 09/17/15  4:17 PM  Result Value Ref Range Status   Specimen Description URINE, CLEAN CATCH  Final   Special Requests Normal  Final   Culture 80,000 COLONIES/ml PROTEUS MIRABILIS  Final   Report Status 09/19/2015 FINAL  Final   Organism ID, Bacteria PROTEUS MIRABILIS  Final      Susceptibility   Proteus mirabilis - MIC*    AMPICILLIN <=2 SENSITIVE Sensitive     CEFAZOLIN <=4 SENSITIVE Sensitive     CEFTRIAXONE <=1 SENSITIVE Sensitive     CIPROFLOXACIN <=0.25 SENSITIVE Sensitive     GENTAMICIN <=1 SENSITIVE Sensitive     IMIPENEM <=0.25 SENSITIVE Sensitive     NITROFURANTOIN 128 RESISTANT Resistant     TRIMETH/SULFA <=20 SENSITIVE Sensitive     CEFTAZIDIME Value in next row Sensitive      SENSITIVE<=1    PIP/TAZO Value in next row Sensitive      SENSITIVE<=4    AMPICILLIN/SULBACTAM Value in next row Sensitive      SENSITIVE<=2    * 80,000 COLONIES/ml PROTEUS MIRABILIS    Coagulation Studies:  Recent Labs  09/19/15 1936  LABPROT 18.3*  INR 1.51    Urinalysis:  Recent Labs  09/17/15 1617  COLORURINE BROWN*  LABSPEC 1.022  PHURINE TEST NOT REPORTED DUE  TO COLOR INTERFERENCE OF URINE PIGMENT  GLUCOSEU TEST NOT REPORTED DUE TO COLOR INTERFERENCE OF URINE PIGMENT*  HGBUR TEST NOT REPORTED DUE TO COLOR INTERFERENCE OF URINE PIGMENT*  BILIRUBINUR TEST NOT REPORTED DUE TO COLOR INTERFERENCE OF URINE PIGMENT*  KETONESUR TEST NOT REPORTED DUE TO COLOR INTERFERENCE OF URINE PIGMENT*  PROTEINUR TEST NOT REPORTED DUE TO COLOR INTERFERENCE OF URINE PIGMENT*  NITRITE TEST NOT REPORTED DUE TO COLOR INTERFERENCE OF URINE PIGMENT*  LEUKOCYTESUR TEST NOT REPORTED DUE TO COLOR INTERFERENCE OF URINE PIGMENT*      Imaging: Nm Pulmonary Perf And Vent  09/18/2015  CLINICAL DATA:  82 year old with tachycardia and acute CHF. Patient underwent cardioversion for atrial fibrillation earlier today. Personal history of pulmonary embolism and DVT. EXAM: NUCLEAR MEDICINE VENTILATION - PERFUSION LUNG SCAN TECHNIQUE: Ventilation images were obtained in multiple projections using inhaled aerosol Tc-6mDTPA. Perfusion images were obtained in multiple projections after intravenous injection of Tc-921mAA. RADIOPHARMACEUTICALS:  32.4 Technetium-9987mPA aerosol inhalation and 4.3 Technetium-24m31m IV COMPARISON:  No prior nuclear imaging. Portable chest x-ray performed earlier same date is correlated. FINDINGS: Ventilation: Deposition of aerosol in the central bronchi. Patchy ventilation in both lungs, with diminished ventilation in the left lung relative to the right. Perfusion: Triple matched large segmental perfusion defects involving the lower lung zones bilaterally, correlating with the large bilateral pleural effusions, left greater than right, noted on the chest x-ray same day. IMPRESSION: Intermediate probability of pulmonary embolism based on PIOPED II criteria (triple matched large segmental perfusion defects involving the lower lung zones bilaterally). These defects correspond to the large bilateral pleural effusions, left greater than right on the chest x-ray earlier today.  Electronically Signed   By: ThomEvangeline Dakin.   On: 09/18/2015 14:52   Dg Chest Port 1 View  09/20/2015  CLINICAL DATA:  Acute respiratory failure, shortness of Breath EXAM: PORTABLE CHEST 1 VIEW COMPARISON:  1/23/ 17 FINDINGS: Cardiomegaly again noted. Persistent mild congestion/ edema. Bilateral small pleural effusion with bilateral basilar atelectasis or infiltrate. Slight increase in right pleural effusion. Dual lead cardiac pacemaker is unchanged in position. IMPRESSION: Persistent mild congestion/ edema. Bilateral small pleural effusion with bilateral basilar atelectasis or infiltrate.  Slight increased right pleural effusion. Dual lead cardiac pacemaker is unchanged in position. Electronically Signed   By: Lahoma Crocker M.D.   On: 09/20/2015 08:54     Medications:   . sodium chloride    . amiodarone 30 mg/hr (09/20/15 0700)  . heparin 950 Units/hr (09/19/15 2300)   . antiseptic oral rinse  7 mL Mouth Rinse q12n4p  . cephALEXin  500 mg Oral Q12H  . chlorhexidine  15 mL Mouth Rinse BID  . cholecalciferol  2,000 Units Oral Daily  . donepezil  10 mg Oral Daily  . memantine  5 mg Oral BID  . mirtazapine  15 mg Oral QHS  . multivitamin with minerals  1 tablet Oral Daily  . pantoprazole  40 mg Oral Daily  . senna  1 tablet Oral QHS  . sertraline  100 mg Oral Daily  . sodium chloride  3 mL Intravenous Q12H   ALPRAZolam, ondansetron **OR** ondansetron (ZOFRAN) IV, sodium chloride  Assessment/ Plan:  Ms. ARIN VANOSDOL is a 82 y.o.white female with congestive heart failure, atrial fibrillation, hyperlipidemia, hypertension, who was admitted to [facility] on 09/15/2015   1. Acute renal failure on chronic kidney disease stage III: baseline creatinine of 1.1, eGFR of 49 from 07/13/2015. Does not take an ACE-I/ARB as outpatient.  Acute renal failure from overdiuresis. Currently with metabolic alkalosis. - Recommend holding diuretics for now.  - Monitor urine output, volume status, renal  function and serum electrolytes. No acute indication for dialysis. Renally dose all medications.  - Will offer dialysis if no improvement in renal function and worsening respiratory function.   2. Hypertension: better control today, hypotensive recently. Takes furosemide and metoprolol as outpatient.  - currently not on any blood pressure agents.   3. Acute exacerbation of congestive heart failure with acute respiratory failure on BiPap: complicated with bilateral pleural effusions.  - unclear if thoracentesis will be tolerated.  - holding diuretics for now.   4. Urinary tract infection: proteus growing on urine culture. Urinalysis was not sufficiently collected.  - cephalexin.    LOS: 5 [name] [name] 1/25/201710:35 AM

## 2015-09-20 NOTE — Progress Notes (Signed)
PULMONARY / CRITICAL CARE MEDICINE   Name: [name] [name] MRN: [id number] DOB: 25-Jul-1923    ADMISSION DATE:  09/15/2015  Consulting Physician - Dr. Winona Legato  BRIEF HISTORY: 09/19/15 History per chart review and family members at the bedside 82 y.o. female with a known history of dementia, tachybradycardia syndrome status post pacemaker placement, hypertension, history of chronic atrial fibrillation on anticoagulation with eliquis, history of GI bleed in the past underwent elective cardioversion by Dr.arida on 08/2015. Patient received cardioversion with 120 J after she was given anesthesia. Post procedure patient remained hypoxic with sats anywhere from 85-90% on room air, she was managed by hospitalist and cardiology on the medical unit. Records show that she was being treated for CHF exacerbation with diuresis, but continued to feel dyspneic and weak, also noted to still be in a.fib.  She was load with amiodarone and underwent another elective cardioversion 09/18/15, converted to NSR, but severely short of breath. Overnight she was noted to have increased lethargy and sob, requiring bipap. ABG showed respiratory failure with hypercapnia.   SUBJECTIVE:  Improving mentation overnight, following commands, family at bedside. ABG with still with hypercapnia, but much improved.    SIGNIFICANT EVENTS: 1/24>>lethary, requiring bipap, transferred to ICU for hypercapnic resp failure.  1/20>>cardioversion, NSR for 1 hour, then went back into afib 1/23>>cardioversion again, NSR 1/23 V\Q scan >> Intermediate probability of pulmonary embolism based on PIOPED II criteria (triple matched large segmental perfusion defects involving the lower lung zones bilaterally). These defects correspond to the large bilateral pleural effusions, left greater than right  VITAL SIGNS: Temp:  [96.1 F (35.6 C)-99.3 F (37.4 C)] 98.2 F (36.8 C) (01/25 0800) Pulse Rate:  [35-123] 60 (01/25 0800) Resp:  [13-22]  18 (01/25 0800) BP: (80-149)/(48-82) 117/63 mmHg (01/25 0800) SpO2:  [94 %-100 %] 99 % (01/25 0811) FiO2 (%):  [60 %-100 %] 60 % (01/25 0741) Weight:  [149 lb 14.6 oz (68 kg)] 149 lb 14.6 oz (68 kg) (01/25 0500) HEMODYNAMICS:   VENTILATOR SETTINGS: Vent Mode:  [-]  FiO2 (%):  [60 %-100 %] 60 % INTAKE / OUTPUT:  Intake/Output Summary (Last 24 hours) at 09/20/15 1120 Last data filed at 09/20/15 0700  Gross per 24 hour  Intake 184.59 ml  Output    200 ml  Net -15.41 ml    Review of Systems  Constitutional: Negative for fever and chills.  HENT: Negative for hearing loss and tinnitus.   Eyes: Negative for blurred vision and double vision.  Respiratory: Positive for cough, sputum production and shortness of breath. Negative for hemoptysis and wheezing.   Cardiovascular: Negative for chest pain.  Gastrointestinal: Negative for heartburn and nausea.  Genitourinary: Negative for dysuria.  Musculoskeletal: Negative for myalgias.  Skin: Negative for rash.  Neurological: Positive for dizziness. Negative for headaches.    Physical Exam  Constitutional: She is well-developed, well-nourished, and in no distress.  HENT:  Head: Normocephalic and atraumatic.  Right Ear: External ear normal.  Left Ear: External ear normal.  Eyes: EOM are normal. Right eye exhibits no discharge.  Neck: Normal range of motion. Neck supple. JVD present. No tracheal deviation present. No thyromegaly present.  Cardiovascular: Normal rate, regular rhythm, normal heart sounds and intact distal pulses.   No murmur heard. Pulmonary/Chest: She has no wheezes. She exhibits no tenderness.  Currently wearing bipap, coarse upper airway sounds, crackles bilaterally to the mid fields.   Abdominal: Soft. Bowel sounds are normal. She exhibits no distension.  Musculoskeletal: Normal range of  motion.  Neurological: She is alert.  Skin: Skin is warm and dry.  Nursing note and vitals reviewed.    LABS:  CBC  Recent  Labs Lab 09/15/15 1156 09/19/15 1936 09/20/15 0905  WBC  --  8.8 9.7  HGB 13.8 14.1 13.2  HCT  --  45.2 41.4  PLT  --  232 234   Coag's  Recent Labs Lab 09/19/15 1936 09/20/15 0905  APTT 35 44*  INR 1.51  --    BMET  Recent Labs Lab 09/18/15 0535 09/19/15 0449 09/19/15 1936  NA 140 137 138  K 3.5 3.9 4.5  CL 97* 95* 95*  CO2 38* 35* 35*  BUN 41* 49* 53*  CREATININE 1.89* 2.53* 3.00*  GLUCOSE 111* 138* 117*   Electrolytes  Recent Labs Lab 09/15/15 1156 09/16/15 0748  09/18/15 0535 09/19/15 0449 09/19/15 1936  CALCIUM 8.6* 8.7*  8.7*  < > 8.8* 9.0 8.9  MG 1.7 1.6*  --   --  1.9  --   PHOS  --  4.3  --   --   --   --   < > = values in this interval not displayed. Sepsis Markers No results for input(s): LATICACIDVEN, PROCALCITON, O2SATVEN in the last 168 hours. ABG  Recent Labs Lab 09/19/15 1305 09/19/15 2059 09/20/15 0840  PHART 7.14* 7.25* 7.32*  PCO2ART 111* 82* 71*  PO2ART 119* 88 92   Liver Enzymes  Recent Labs Lab 09/16/15 0748 09/19/15 1936  AST  --  60*  ALT  --  31  ALKPHOS  --  117  BILITOT  --  0.5  ALBUMIN 2.0* 2.1*   Cardiac Enzymes No results for input(s): TROPONINI, PROBNP in the last 168 hours. Glucose No results for input(s): GLUCAP in the last 168 hours.  Imaging Dg Chest Port 1 View  09/20/2015  CLINICAL DATA:  Acute respiratory failure, shortness of Breath EXAM: PORTABLE CHEST 1 VIEW COMPARISON:  1/23/ 17 FINDINGS: Cardiomegaly again noted. Persistent mild congestion/ edema. Bilateral small pleural effusion with bilateral basilar atelectasis or infiltrate. Slight increase in right pleural effusion. Dual lead cardiac pacemaker is unchanged in position. IMPRESSION: Persistent mild congestion/ edema. Bilateral small pleural effusion with bilateral basilar atelectasis or infiltrate. Slight increased right pleural effusion. Dual lead cardiac pacemaker is unchanged in position. Electronically Signed   By: Natasha Mead M.D.   On:  09/20/2015 08:54    LINES:   CULTURES: Ur Cx 1/22>>Proteus  ANTIBIOTICS Keflex 1/22  ASSESSMENT / PLAN: 82 yo female with Afib, s/p cardioverison (DCCV x 2), dCHF, tachy-brady syndrome, CKDIII, admitted to the ICU with acute hypercapnic respiratory failure and bilateral pleural effusions.   PULMONARY Hypercapnic respiratory failure - now improving Bilateral Pleural effusion P:  - cont with bipap, repeat abg still with hypercapnia, Bipap adjusted, but with good overall improvement.  May take breaks today off bipap, with HFNC - maintain O2 sats > 90% - DNR - monitor I\Os - Pleural effusion not assisting with gas exchange, now with improvement. Would not recommend intubation given advance age, deconditioning, bilateral pleural effusions, CHF.  Her CO2 is improving with non-invasive measure. Also, would not recommend therapeutic thoracentesis, high risk procedure in this patient, especially since she is clinically improving.  - V/Q scan consistent with pleural effusion   CARDIOVASCULAR Paroxysmal Afib dCHF - acute on chronic Tachybrady Syndrome P:  -s/p DCCV x 2 - on amiodarone - cardiology following along  RENAL Acute renal insufficiency CKD III UTI P: - mostly  likely related to diuresis, high bicarb noted on BMP - monitor I\O - BMP in the am - monitor Cr - cont with keflex  GASTROINTESTINAL SUP  P:  PPI  HEMATOLOGIC - montior CBC   NEUROLOGIC A: Acute encephalopathy - metabolic P:  RASS goal: 0     I have personally obtained a history, examined the patient, evaluated laboratory and imaging results, formulated the assessment and plan and placed orders.  The Patient requires high complexity decision making for assessment and support, frequent evaluation and titration of therapies, application of advanced monitoring technologies and extensive interpretation of multiple databases.   Critical Care Time devoted to patient care services described in  this note is 35 minutes.   Overall, patient is critically ill, prognosis is guarded. Patient at high risk for cardiac arrest and death.   Stephanie Acre, MD [facility] Pager [phone] (please enter 7-[phone]) On Call Pager - [phone] (please enter [phone]-digits)     09/19/2015, 3:02 PM  Note: This note was prepared with Dragon dictation along with smaller phrase technology. Any transcriptional errors that result from this process are unintentional.

## 2015-09-21 ENCOUNTER — Inpatient Hospital Stay: Payer: Commercial Managed Care - HMO

## 2015-09-21 DIAGNOSIS — J9602 Acute respiratory failure with hypercapnia: Secondary | ICD-10-CM

## 2015-09-21 DIAGNOSIS — J90 Pleural effusion, not elsewhere classified: Secondary | ICD-10-CM

## 2015-09-21 LAB — BLOOD GAS, ARTERIAL
ALLENS TEST (PASS/FAIL): POSITIVE — AB
Acid-Base Excess: 7.1 mmol/L — ABNORMAL HIGH (ref 0.0–3.0)
BICARBONATE: 33.9 meq/L — AB (ref 21.0–28.0)
Delivery systems: POSITIVE
EXPIRATORY PAP: 10
FIO2: 0.5
Inspiratory PAP: 18
Mechanical Rate: 10
O2 SAT: 94.9 %
PATIENT TEMPERATURE: 37
PCO2 ART: 56 mmHg — AB (ref 32.0–48.0)
PH ART: 7.39 (ref 7.350–7.450)
PO2 ART: 76 mmHg — AB (ref 83.0–108.0)

## 2015-09-21 LAB — BASIC METABOLIC PANEL
ANION GAP: 10 (ref 5–15)
BUN: 68 mg/dL — AB (ref 6–20)
CO2: 32 mmol/L (ref 22–32)
Calcium: 8.4 mg/dL — ABNORMAL LOW (ref 8.9–10.3)
Chloride: 94 mmol/L — ABNORMAL LOW (ref 101–111)
Creatinine, Ser: 4.25 mg/dL — ABNORMAL HIGH (ref 0.44–1.00)
GFR calc Af Amer: 10 mL/min — ABNORMAL LOW (ref >60)
GFR, EST NON AFRICAN AMERICAN: 8 mL/min — AB (ref >60)
Glucose, Bld: 79 mg/dL (ref 65–99)
POTASSIUM: 4.2 mmol/L (ref 3.5–5.1)
SODIUM: 136 mmol/L (ref 135–145)

## 2015-09-21 LAB — CBC
HEMATOCRIT: 41.5 % (ref 35.0–47.0)
Hemoglobin: 13.6 g/dL (ref 12.0–16.0)
MCH: 30.8 pg (ref 26.0–34.0)
MCHC: 32.8 g/dL (ref 32.0–36.0)
MCV: 94.1 fL (ref 80.0–100.0)
Platelets: 244 10*3/uL (ref 150–440)
RBC: 4.4 MIL/uL (ref 3.80–5.20)
RDW: 16.4 % — AB (ref 11.5–14.5)
WBC: 9.8 10*3/uL (ref 3.6–11.0)

## 2015-09-21 LAB — MRSA PCR SCREENING: MRSA by PCR: NEGATIVE

## 2015-09-21 MED ORDER — MEMANTINE HCL 5 MG PO TABS: 5.0000 mg | ORAL_TABLET | Freq: Every day | ORAL | Status: DC

## 2015-09-21 MED ORDER — ENSURE ENLIVE PO LIQD
237.0000 mL | Freq: Three times a day (TID) | ORAL | Status: DC
  Administered 2015-09-21 (×2): 237 mL via ORAL

## 2015-09-21 MED ORDER — DEXTROSE 5 % IV SOLN
500.0000 mg | INTRAVENOUS | Status: DC
  Filled 2015-09-21: qty 5

## 2015-09-21 NOTE — Progress Notes (Signed)
PULMONARY / CRITICAL CARE MEDICINE   Name: [name] [name] MRN: [id number] DOB: [birth year]    ADMISSION DATE:  09/15/2015  Consulting Physician - Dr. Winona Legato  BRIEF HISTORY: 09/19/15 History per chart review and family members at the bedside 82 y.o. female with a known history of dementia, tachybradycardia syndrome status post pacemaker placement, hypertension, history of chronic atrial fibrillation on anticoagulation with eliquis, history of GI bleed in the past underwent elective cardioversion by Dr.arida on 08/2015. Patient received cardioversion with 120 J after she was given anesthesia. Post procedure patient remained hypoxic with sats anywhere from 85-90% on room air, she was managed by hospitalist and cardiology on the medical unit. Records show that she was being treated for CHF exacerbation with diuresis, but continued to feel dyspneic and weak, also noted to still be in a.fib.  She was load with amiodarone and underwent another elective cardioversion 09/18/15, converted to NSR, but severely short of breath. Overnight she was noted to have increased lethargy and sob, requiring bipap. ABG showed respiratory failure with hypercapnia.   SUBJECTIVE:  Improving mentation overnight, following commands, family at bedside. ABG with still with hypercapnia, but much improved.    SIGNIFICANT EVENTS: 1/24>>lethary, requiring bipap, transferred to ICU for hypercapnic resp failure.  1/20>>cardioversion, NSR for 1 hour, then went back into afib 1/23>>cardioversion again, NSR 1/23 V\Q scan >> Intermediate probability of pulmonary embolism based on PIOPED II criteria (triple matched large segmental perfusion defects involving the lower lung zones bilaterally). These defects correspond to the large bilateral pleural effusions, left greater than right  VITAL SIGNS: Temp:  [97 F (36.1 C)-99 F (37.2 C)] 99 F (37.2 C) (01/26 1336) Pulse Rate:  [59-116] 104 (01/26 1336) Resp:  [15-27] 23  (01/26 1336) BP: (98-131)/(57-76) 126/76 mmHg (01/26 1300) SpO2:  [90 %-100 %] 99 % (01/26 1336) FiO2 (%):  [50 %-55 %] 55 % (01/26 1336) Weight:  [161 lb 6 oz (73.2 kg)] 161 lb 6 oz (73.2 kg) (01/26 0500) HEMODYNAMICS:   VENTILATOR SETTINGS: Vent Mode:  [-]  FiO2 (%):  [50 %-55 %] 55 % INTAKE / OUTPUT:  Intake/Output Summary (Last 24 hours) at 09/21/15 1430 Last data filed at 09/21/15 1041  Gross per 24 hour  Intake 203.47 ml  Output    175 ml  Net  28.47 ml    Review of Systems  Constitutional: Negative for fever and chills.  HENT: Negative for hearing loss and tinnitus.   Eyes: Negative for blurred vision and double vision.  Respiratory: Positive for shortness of breath. Negative for hemoptysis, sputum production and wheezing.   Cardiovascular: Negative for chest pain.  Gastrointestinal: Negative for heartburn and nausea.  Genitourinary: Negative for dysuria.  Musculoskeletal: Negative for myalgias.  Skin: Negative for rash.  Neurological: Positive for dizziness. Negative for headaches.    Physical Exam  Constitutional: She is well-developed, well-nourished, and in no distress.  HENT:  Head: Normocephalic and atraumatic.  Right Ear: External ear normal.  Left Ear: External ear normal.  Eyes: EOM are normal. Right eye exhibits no discharge.  Neck: Normal range of motion. Neck supple. No tracheal deviation present. No thyromegaly present.  Cardiovascular: Normal rate, regular rhythm, normal heart sounds and intact distal pulses.   No murmur heard. Pulmonary/Chest: She has no wheezes. She exhibits no tenderness.  Currently wearing bipap, coarse upper airway sounds, crackles bilaterally to the mid fields.   Abdominal: Soft. Bowel sounds are normal. She exhibits no distension.  Musculoskeletal: Normal range of motion.  Neurological:  She is alert.  Skin: Skin is warm and dry.  Nursing note and vitals reviewed.    LABS:  CBC  Recent Labs Lab 09/19/15 1936  09/20/15 0905 09/21/15 0614  WBC 8.8 9.7 9.8  HGB 14.1 13.2 13.6  HCT 45.2 41.4 41.5  PLT 232 234 244   Coag's  Recent Labs Lab 09/19/15 1936 09/20/15 0905  APTT 35 44*  INR 1.51  --    BMET  Recent Labs Lab 09/19/15 1936 09/20/15 1239 09/21/15 0614  NA 138 139 136  K 4.5 4.2 4.2  CL 95* 93* 94*  CO2 35* 37* 32  BUN 53* 62* 68*  CREATININE 3.00* 3.66* 4.25*  GLUCOSE 117* 98 79   Electrolytes  Recent Labs Lab 09/15/15 1156 09/16/15 0748  09/19/15 0449 09/19/15 1936 09/20/15 1239 09/21/15 0614  CALCIUM 8.6* 8.7*  8.7*  < > 9.0 8.9 9.1 8.4*  MG 1.7 1.6*  --  1.9  --   --   --   PHOS  --  4.3  --   --   --   --   --   < > = values in this interval not displayed. Sepsis Markers No results for input(s): LATICACIDVEN, PROCALCITON, O2SATVEN in the last 168 hours. ABG  Recent Labs Lab 09/20/15 0840 09/20/15 1416 09/21/15 0506  PHART 7.32* 7.25* 7.39  PCO2ART 71* 80* 56*  PO2ART 92 62* 76*   Liver Enzymes  Recent Labs Lab 09/16/15 0748 09/19/15 1936  AST  --  60*  ALT  --  31  ALKPHOS  --  117  BILITOT  --  0.5  ALBUMIN 2.0* 2.1*   Cardiac Enzymes No results for input(s): TROPONINI, PROBNP in the last 168 hours. Glucose No results for input(s): GLUCAP in the last 168 hours.  Imaging US Venous Img Upper Uni Right  09/20/2015  CLINICAL DATA:  Right upper extremity edema. EXAM: RIGHT UPPER EXTREMITY VENOUS DOPPLER ULTRASOUND TECHNIQUE: Gray-scale sonography with graded compression, as well as color Doppler and duplex ultrasound were performed to evaluate the upper extremity deep venous system from the level of the subclavian vein and including the jugular, axillary, basilic, radial, ulnar and upper cephalic vein. Spectral Doppler was utilized to evaluate flow at rest and with distal augmentation maneuvers. COMPARISON:  None. FINDINGS: Contralateral Subclavian Vein: Respiratory phasicity is normal and symmetric with the symptomatic side. No evidence of  thrombus. Normal compressibility. Internal Jugular Vein: No evidence of thrombus. Normal compressibility, respiratory phasicity and response to augmentation. Subclavian Vein: No evidence of thrombus. Normal compressibility, respiratory phasicity and response to augmentation. Axillary Vein: No evidence of thrombus. Normal compressibility, respiratory phasicity and response to augmentation. Cephalic Vein: No evidence of thrombus. Normal compressibility, respiratory phasicity and response to augmentation. Basilic Vein: No evidence of thrombus. Normal compressibility, respiratory phasicity and response to augmentation. Brachial Veins: No evidence of thrombus. Normal compressibility, respiratory phasicity and response to augmentation. Radial Veins: No evidence of thrombus. Normal compressibility, respiratory phasicity and response to augmentation. Ulnar Veins: No evidence of thrombus. Normal compressibility, respiratory phasicity and response to augmentation. Venous Reflux:  None visualized. Other Findings: No evidence of superficial thrombophlebitis or abnormal fluid collection. IMPRESSION: No evidence of right upper extremity deep venous thrombosis. Electronically Signed   By: Irish Lack M.D.   On: 09/20/2015 16:31    LINES:   CULTURES: Ur Cx 1/22>>Proteus  ANTIBIOTICS Keflex 1/22  ASSESSMENT / PLAN: 82 yo female with Afib, s/p cardioverison (DCCV x 2), dCHF, tachy-brady syndrome, CKDIII,  admitted to the ICU with acute hypercapnic respiratory failure and bilateral pleural effusions.   PULMONARY Hypercapnic respiratory failure - now improving - still requiring bipap Bilateral Pleural effusion P:  - cont with bipap, May take breaks today off bipap, with HFNC - maintain O2 sats > 90% - DNR - monitor I\Os - Pleural effusion not assisting with gas exchange. Would not recommend intubation given advance age, deconditioning, bilateral pleural effusions, CHF,and renal insufficiency.  Her CO2 is  improving with non-invasive measures.  - V/Q scan consistent with pleural effusion - Still with moderate pleural effusion - may consider 1 time Left sided thoracentesis, however high risk for pneumothorax and re-expansion pulmonary edema. Family and patient will discuss, if they agree, will make patient NPO, and get IR U/S thoracentesis.   CARDIOVASCULAR Paroxysmal Afib dCHF - acute on chronic Tachybrady Syndrome P:  -s/p DCCV x 2 - on amiodarone - cardiology following along  RENAL Acute renal insufficiency CKD III UTI P: - mostly likely related to diuresis, high bicarb noted on BMP - monitor I\O - BMP in the am - monitor Cr - cont with keflex - patient and family decided to NOT pursue dialysis  GASTROINTESTINAL SUP  P:  PPI  HEMATOLOGIC - montior CBC   NEUROLOGIC A: Acute encephalopathy - metabolic P:  RASS goal: 0   I have personally obtained a history, examined the patient, evaluated laboratory and imaging results, formulated the assessment and plan and placed orders.  The Patient requires high complexity decision making for assessment and support, frequent evaluation and titration of therapies, application of advanced monitoring technologies and extensive interpretation of multiple databases.   Critical Care Time devoted to patient care services described in this note is 35 minutes.   Overall, patient is critically ill, prognosis is guarded. Patient at high risk for cardiac arrest and death.   Stephanie Acre, MD [facility] Pager [phone] (please enter 7-digits) On Call Pager - [phone] (please enter 7-digits)     09/19/2015, 3:02 PM  Note: This note was prepared with Dragon dictation along with smaller phrase technology. Any transcriptional errors that result from this process are unintentional.

## 2015-09-21 NOTE — Progress Notes (Signed)
Patient Name: [name] [name] Date of Encounter: 09/21/2015  Principal Problem:   Atrial fibrillation with RVR (HCC) Active Problems:   Acute on chronic diastolic (congestive) heart failure (HCC)   Hypoxia   Encephalopathy acute   Lethargic   Respiratory failure (HCC)   Swelling   Tachycardia    Primary Cardiologist: Dr. Graciela Husbands Patient Profile: 57F w/ PMH of persistent atrial fibrillation, tachy-brady syndrome (s/p PPM), GIB, admitted on 09/15/2015 with hypoxic respiratory failure and acute on chronic diastolic heart failure. Underwent repeat DCCV on 09/18/2015. Was lethargic the following morning and noted to have respiratory acidosis requiring transfer to the ICU and being placed on BiPAP.  SUBJECTIVE: Converted back into atrial fibrillation overnight. Asymptomatic without palpitations or chest pain. Was on BiPAP this morning, recently switched back to high-flow nasal cannula. Patient's daughter reported asking her mom several times if you wants dialysis and her saying she does not want anymore procedures.  OBJECTIVE Filed Vitals:   09/21/15 0825 09/21/15 0900 09/21/15 1000 09/21/15 1100  BP:  110/65 106/64 123/73  Pulse: 96 109 113 112  Temp: 98.2 F (36.8 C) 98.2 F (36.8 C) 98.2 F (36.8 C) 98.4 F (36.9 C)  TempSrc:      Resp: Height:      Weight:      SpO2: 90% 98% 98% 98%    Intake/Output Summary (Last 24 hours) at 09/21/15 1122 Last data filed at 09/21/15 1000  Gross per 24 hour  Intake 180.67 ml  Output    175 ml  Net   5.67 ml   Filed Weights   09/19/15 0425 09/20/15 0500 09/21/15 0500  Weight: 148 lb 14.4 oz (67.541 kg) 149 lb 14.6 oz (68 kg) 161 lb 6 oz (73.2 kg)    PHYSICAL EXAM General: Well developed, well nourished, female in no acute distress. Head: Normocephalic, atraumatic.  Neck: Supple without bruits, JVD not elevated. Lungs:  Resp regular and unlabored, Decreased breath sounds at bases. Expiratory wheeze noted. On  high-flow nasal cannula.  Heart: Irregularly irregular, S1, S2, no S3, S4, or murmur; no rub. Abdomen: Soft, non-tender, non-distended with normoactive bowel sounds. No hepatomegaly. No rebound/guarding. No obvious abdominal masses. Extremities: No clubbing or cyanosis. 1+ edema bilaterally. Distal pedal pulses are 2+ bilaterally. Neuro: Alert and oriented X 3. Moves all extremities spontaneously. Psych: Normal affect.   LABS: CBC: Recent Labs  09/20/15 0905 09/21/15 0614  WBC 9.7 9.8  HGB 13.2 13.6  HCT 41.4 41.5  MCV 94.3 94.1  PLT 234 244   INR: Recent Labs  09/19/15 1936  INR 1.51   Basic Metabolic Panel: Recent Labs  09/19/15 0449  09/20/15 1239 09/21/15 0614  NA 137  < > 139 136  K 3.9  < > 4.2 4.2  CL 95*  < > 93* 94*  CO2 35*  < > 37* 32  GLUCOSE 138*  < > 98 79  BUN 49*  < > 62* 68*  CREATININE 2.53*  < > 3.66* 4.25*  CALCIUM 9.0  < > 9.1 8.4*  MG 1.9  --   --   --   < > = values in this interval not displayed. Liver Function Tests: Recent Labs  09/19/15 1936  AST 60*  ALT 31  ALKPHOS 117  BILITOT 0.5  PROT 5.7*  ALBUMIN 2.1*   Cardiac Enzymes: Recent Labs  09/20/15 0856  CKTOTAL 29*   No results for input(s): TROPIPOC in the last  72 hours. BNP: No results found for: BNP PRO B NATRIURETIC PEPTIDE (BNP)  Date/Time Value Ref Range Status  08/08/2015 10:54 AM 946.0* 0.0 - 100.0 pg/mL Final    TELE:  Converted back to atrial fibrillation overnight. HR  In low 110's.      Radiology/Studies: US Venous Img Upper Uni Right: 09/20/2015  CLINICAL DATA:  Right upper extremity edema. EXAM: RIGHT UPPER EXTREMITY VENOUS DOPPLER ULTRASOUND TECHNIQUE: Gray-scale sonography with graded compression, as well as color Doppler and duplex ultrasound were performed to evaluate the upper extremity deep venous system from the level of the subclavian vein and including the jugular, axillary, basilic, radial, ulnar and upper cephalic vein. Spectral Doppler was  utilized to evaluate flow at rest and with distal augmentation maneuvers. COMPARISON:  None. FINDINGS: Contralateral Subclavian Vein: Respiratory phasicity is normal and symmetric with the symptomatic side. No evidence of thrombus. Normal compressibility. Internal Jugular Vein: No evidence of thrombus. Normal compressibility, respiratory phasicity and response to augmentation. Subclavian Vein: No evidence of thrombus. Normal compressibility, respiratory phasicity and response to augmentation. Axillary Vein: No evidence of thrombus. Normal compressibility, respiratory phasicity and response to augmentation. Cephalic Vein: No evidence of thrombus. Normal compressibility, respiratory phasicity and response to augmentation. Basilic Vein: No evidence of thrombus. Normal compressibility, respiratory phasicity and response to augmentation. Brachial Veins: No evidence of thrombus. Normal compressibility, respiratory phasicity and response to augmentation. Radial Veins: No evidence of thrombus. Normal compressibility, respiratory phasicity and response to augmentation. Ulnar Veins: No evidence of thrombus. Normal compressibility, respiratory phasicity and response to augmentation. Venous Reflux:  None visualized. Other Findings: No evidence of superficial thrombophlebitis or abnormal fluid collection. IMPRESSION: No evidence of right upper extremity deep venous thrombosis. Electronically Signed   By: Irish Lack M.D.   On: 09/20/2015 16:31   Dg Chest Port 1 View: 09/20/2015  CLINICAL DATA:  Acute respiratory failure, shortness of Breath EXAM: PORTABLE CHEST 1 VIEW COMPARISON:  1/23/ 17 FINDINGS: Cardiomegaly again noted. Persistent mild congestion/ edema. Bilateral small pleural effusion with bilateral basilar atelectasis or infiltrate. Slight increase in right pleural effusion. Dual lead cardiac pacemaker is unchanged in position. IMPRESSION: Persistent mild congestion/ edema. Bilateral small pleural effusion with  bilateral basilar atelectasis or infiltrate. Slight increased right pleural effusion. Dual lead cardiac pacemaker is unchanged in position. Electronically Signed   By: Natasha Mead M.D.   On: 09/20/2015 08:54     Current Medications:  . amiodarone  200 mg Oral Daily  . antiseptic oral rinse  7 mL Mouth Rinse q12n4p  .  ceFAZolin (ANCEF) IV  500 mg Intravenous Q12H  . chlorhexidine  15 mL Mouth Rinse BID  . cholecalciferol  2,000 Units Oral Daily  . donepezil  10 mg Oral Daily  . feeding supplement (ENSURE ENLIVE)  237 mL Oral TID  . memantine  5 mg Oral BID  . mirtazapine  15 mg Oral QHS  . multivitamin with minerals  1 tablet Oral Daily  . pantoprazole  40 mg Oral Daily  . senna  1 tablet Oral QHS  . sertraline  100 mg Oral Daily  . sodium chloride  3 mL Intravenous Q12H   . sodium chloride      ASSESSMENT AND PLAN:  1. Paroxysmal Atrial fibrillation with RVR - underwent TEE/DCCV on 09/15/2015 with return to NSR for approximately one hour but unfortunately went back into atrial fibrillation with RVR  - Repeat DCCV on 09/18/2015, was on Amiodarone drip. Converted back into atrial fibrillation around 2200 on  09/20/2015. Now on PO Amiodarone  daily. HR mildly elevated in the 110's. - Metoprolol held for hypotension.  - CHA2DS2-VASc Score and unadjusted Ischemic Stroke Rate (% per year) is equal to 7.2 % stroke rate/year from a score of 5 (CHF, HTN, Age (2), Female). Was switched to Heparin in anticipation of thoracentesis. Foley bag on 09/20/2015 demonstrated Hematuria, somewhat improved today. Will hold Heparin at this time.  2. Acute on Chronic Diastolic CHF  - Creatinine became elevated on lasix and UTI, now held - still with lower extremity edema and persistent bilateral pleural effusions - may need thoracentesis for worsening dyspnea and bilateral pleural effusions. Patient is not wishing to have further procedures at this time.  3. Tachy-brady Syndrome - s/p PPM  placement.  4. Stage 3 CKD - Creatinine continues to trend upward. 1.89 on 09/18/2015, 4.25 on 09/21/2015.  - Nephrology following. The patient does not wish to pursue HD and voiced this to her daughter several times.  5. Bilateral Pleural effusions - Moderate in size on x-ray and VQ scan. Contributing to her shortness of breath symptoms.  6. Acute Respiratory Failure - hypoxic, lethargic, and with altered mental status on 09/19/2015. Was placed on BiPAP was transitioned to high-flow nasal cannula earlier today.  - remains on BiPAP currently.  Signed, Ellsworth Lennox , PA-C 11:22 AM 09/21/2015 Pager: [phone]

## 2015-09-21 NOTE — Progress Notes (Signed)
RT removed patient from Bipap and placed her on HFNC 55% at 45 LPM.  O2 sat remained 98%, pt tolerating HFNC well at this time.  Will continue to monitor.

## 2015-09-21 NOTE — Care Management (Signed)
Family asking abut patient receiving hospice services in the home.  She would need hospital bed and oxygen for sure.  At present, patient is on continuous bipap at 55%.  At present do not know if current 02 requirement can be met in the home.  Family wishes to speak with palliative care team member to discuss symptom management and disposition options.  Patient has declined dialysis today (she is without urine output and rising BUN and creatinine).  Provided family members with list of hospice agencies.  Informed family that Dr Megan Salon will meet with family tomorrow afternoon around 3pm.

## 2015-09-21 NOTE — Progress Notes (Signed)
[facility]  ROUNDING NOTE   Subjective:   Daughter and son at bedside. Anuric. Worsening renal function  Objective:  Vital signs in last 24 hours:  Temp:  [97 F (36.1 C)-99 F (37.2 C)] 99 F (37.2 C) (01/26 1336) Pulse Rate:  [59-116] 104 (01/26 1336) Resp:  [15-27] 23 (01/26 1336) BP: (98-131)/(57-76) 126/76 mmHg (01/26 1300) SpO2:  [90 %-100 %] 99 % (01/26 1336) FiO2 (%):  [50 %-55 %] 55 % (01/26 1336) Weight:  [73.2 kg (161 lb 6 oz)] 73.2 kg (161 lb 6 oz) (01/26 0500)  Weight change: 5.2 kg (11 lb 7.4 oz) Filed Weights   09/19/15 0425 09/20/15 0500 09/21/15 0500  Weight: 67.541 kg (148 lb 14.4 oz) 68 kg (149 lb 14.6 oz) 73.2 kg (161 lb 6 oz)    Intake/Output: I/O last 3 completed shifts: In: 362.3 [I.V.:307.3; IV Piggyback:55] Out: 300 [Urine:300]   Intake/Output this shift:  Total I/O In: 53 [I.V.:3; IV Piggyback:50] Out: 75 [Urine:75]  Physical Exam: General: Critically ill   Head: Normocephalic, atraumatic. +BIPAP  Eyes: Anicteric, PERRL  Neck: Supple, trachea midline, +JVD  Lungs:  Bilateral crackles  Heart: Paced rhythm  Abdomen:  Soft, nontender,   Extremities: 1+ peripheral edema.  Neurologic: Lethargic, answering questions  Skin: No lesions  Access: none    Basic Metabolic Panel:  Recent Labs Lab 09/15/15 1156  09/16/15 0748  09/18/15 0535 09/19/15 0449 09/19/15 1936 09/20/15 1239 09/21/15 0614  NA 141  --  141  142  < > 140 137 138 139 136  K 3.1*  < > 3.5  3.5  < > 3.5 3.9 4.5 4.2 4.2  CL 99*  --  99*  99*  < > 97* 95* 95* 93* 94*  CO2 36*  --  38*  39*  < > 38* 35* 35* 37* 32  GLUCOSE 121*  --  103*  103*  < > 111* 138* 117* 98 79  BUN 29*  --  29*  29*  < > 41* 49* 53* 62* 68*  CREATININE 1.22*  --  1.42*  1.28*  < > 1.89* 2.53* 3.00* 3.66* 4.25*  CALCIUM 8.6*  --  8.7*  8.7*  < > 8.8* 9.0 8.9 9.1 8.4*  MG 1.7  --  1.6*  --   --  1.9  --   --   --   PHOS  --   --  4.3  --   --   --   --   --   --   < > =  values in this interval not displayed.  Liver Function Tests:  Recent Labs Lab 09/16/15 0748 09/19/15 1936  AST  --  60*  ALT  --  31  ALKPHOS  --  117  BILITOT  --  0.5  PROT  --  5.7*  ALBUMIN 2.0* 2.1*   No results for input(s): LIPASE, AMYLASE in the last 168 hours. No results for input(s): AMMONIA in the last 168 hours.  CBC:  Recent Labs Lab 09/15/15 1156 09/19/15 1936 09/20/15 0905 09/21/15 0614  WBC  --  8.8 9.7 9.8  HGB 13.8 14.1 13.2 13.6  HCT  --  45.2 41.4 41.5  MCV  --  98.0 94.3 94.1  PLT  --  232 234 244    Cardiac Enzymes:  Recent Labs Lab 09/20/15 0856  CKTOTAL 29*    BNP: Invalid input(s): POCBNP  CBG: No results for input(s): GLUCAP in the last  168 hours.  Microbiology: Results for orders placed or performed during the hospital encounter of 09/15/15  Urine culture     Status: None   Collection Time: 09/17/15  4:17 PM  Result Value Ref Range Status   Specimen Description URINE, CLEAN CATCH  Final   Special Requests Normal  Final   Culture 80,000 COLONIES/ml PROTEUS MIRABILIS  Final   Report Status 09/19/2015 FINAL  Final   Organism ID, Bacteria PROTEUS MIRABILIS  Final      Susceptibility   Proteus mirabilis - MIC*    AMPICILLIN <=2 SENSITIVE Sensitive     CEFAZOLIN <=4 SENSITIVE Sensitive     CEFTRIAXONE <=1 SENSITIVE Sensitive     CIPROFLOXACIN <=0.25 SENSITIVE Sensitive     GENTAMICIN <=1 SENSITIVE Sensitive     IMIPENEM <=0.25 SENSITIVE Sensitive     NITROFURANTOIN 128 RESISTANT Resistant     TRIMETH/SULFA <=20 SENSITIVE Sensitive     CEFTAZIDIME Value in next row Sensitive      SENSITIVE<=1    PIP/TAZO Value in next row Sensitive      SENSITIVE<=4    AMPICILLIN/SULBACTAM Value in next row Sensitive      SENSITIVE<=2    * 80,000 COLONIES/ml PROTEUS MIRABILIS    Coagulation Studies:  Recent Labs  09/19/15 1936  LABPROT 18.3*  INR 1.51    Urinalysis: No results for input(s): COLORURINE, LABSPEC, PHURINE,  GLUCOSEU, HGBUR, BILIRUBINUR, KETONESUR, PROTEINUR, UROBILINOGEN, NITRITE, LEUKOCYTESUR in the last 72 hours.  Invalid input(s): APPERANCEUR    Imaging: US Venous Img Upper Uni Right  09/20/2015  CLINICAL DATA:  Right upper extremity edema. EXAM: RIGHT UPPER EXTREMITY VENOUS DOPPLER ULTRASOUND TECHNIQUE: Gray-scale sonography with graded compression, as well as color Doppler and duplex ultrasound were performed to evaluate the upper extremity deep venous system from the level of the subclavian vein and including the jugular, axillary, basilic, radial, ulnar and upper cephalic vein. Spectral Doppler was utilized to evaluate flow at rest and with distal augmentation maneuvers. COMPARISON:  None. FINDINGS: Contralateral Subclavian Vein: Respiratory phasicity is normal and symmetric with the symptomatic side. No evidence of thrombus. Normal compressibility. Internal Jugular Vein: No evidence of thrombus. Normal compressibility, respiratory phasicity and response to augmentation. Subclavian Vein: No evidence of thrombus. Normal compressibility, respiratory phasicity and response to augmentation. Axillary Vein: No evidence of thrombus. Normal compressibility, respiratory phasicity and response to augmentation. Cephalic Vein: No evidence of thrombus. Normal compressibility, respiratory phasicity and response to augmentation. Basilic Vein: No evidence of thrombus. Normal compressibility, respiratory phasicity and response to augmentation. Brachial Veins: No evidence of thrombus. Normal compressibility, respiratory phasicity and response to augmentation. Radial Veins: No evidence of thrombus. Normal compressibility, respiratory phasicity and response to augmentation. Ulnar Veins: No evidence of thrombus. Normal compressibility, respiratory phasicity and response to augmentation. Venous Reflux:  None visualized. Other Findings: No evidence of superficial thrombophlebitis or abnormal fluid collection. IMPRESSION: No  evidence of right upper extremity deep venous thrombosis. Electronically Signed   By: Aletta Edouard M.D.   On: 09/20/2015 16:31   Dg Chest Port 1 View  09/20/2015  CLINICAL DATA:  Acute respiratory failure, shortness of Breath EXAM: PORTABLE CHEST 1 VIEW COMPARISON:  1/23/ 17 FINDINGS: Cardiomegaly again noted. Persistent mild congestion/ edema. Bilateral small pleural effusion with bilateral basilar atelectasis or infiltrate. Slight increase in right pleural effusion. Dual lead cardiac pacemaker is unchanged in position. IMPRESSION: Persistent mild congestion/ edema. Bilateral small pleural effusion with bilateral basilar atelectasis or infiltrate. Slight increased right pleural effusion. Dual lead cardiac  pacemaker is unchanged in position. Electronically Signed   By: Lahoma Crocker M.D.   On: 09/20/2015 08:54     Medications:   . sodium chloride     . amiodarone  200 mg Oral Daily  . antiseptic oral rinse  7 mL Mouth Rinse q12n4p  .  ceFAZolin (ANCEF) IV  500 mg Intravenous Q12H  . chlorhexidine  15 mL Mouth Rinse BID  . cholecalciferol  2,000 Units Oral Daily  . donepezil  10 mg Oral Daily  . feeding supplement (ENSURE ENLIVE)  237 mL Oral TID  . memantine  5 mg Oral BID  . mirtazapine  15 mg Oral QHS  . multivitamin with minerals  1 tablet Oral Daily  . pantoprazole  40 mg Oral Daily  . senna  1 tablet Oral QHS  . sertraline  100 mg Oral Daily  . sodium chloride  3 mL Intravenous Q12H   ALPRAZolam, ondansetron **OR** ondansetron (ZOFRAN) IV, sodium chloride  Assessment/ Plan:  [name] is a 82 y.o.white female with congestive heart failure, atrial fibrillation, hyperlipidemia, hypertension, who was admitted to [facility] on 09/15/2015   1. Acute renal failure on chronic kidney disease stage III: baseline creatinine of 1.1, eGFR of 49 from 07/13/2015.  Acute renal failure from overdiuresis. Currently with metabolic alkalosis. However currently anuric. At this time, I have  offered dialysis to family. Patient states that she does not want dialysis at this time. She and family understands the risks.   2. Hypertension: better control today, hypotensive recently. Takes furosemide and metoprolol as outpatient.  - currently not on any blood pressure agents.   3. Acute exacerbation of congestive heart failure with acute respiratory failure on BiPap: complicated with bilateral pleural effusions.  - unclear if thoracentesis will be tolerated.  - holding diuretics for now.   4. Urinary tract infection: proteus growing on urine culture. Urinalysis was not sufficiently collected.  - cephalexin.    LOS: [place], [place] 1/26/20172:35 PM

## 2015-09-21 NOTE — Progress Notes (Signed)
[facility] [place] at [facility]   PATIENT NAME: [name] [name]    MR#:  [id number]  DATE OF BIRTH:  Jun 23, 1923  SUBJECTIVE: I feel good  CHIEF COMPLAINT:  No chief complaint on file.  patient is a 82 year old Caucasian female with past medical history significant for history of atrial fibrillation for which she underwent elective cardioversion with Dr. Kirke Corin. She remained in sinus rhythm for about one hour and then converted back into atrial fibrillation, became short of breath and presented  to the hospital hypoxic and in CHF. She was diuresed approximately 700 cc since admission. Patient was loaded with amiodarone and underwent elective cardioversion 23rd of January again, converted into sinus rhythm and remains in sinus rhythm. Poorly responsive yesterday, , dyspneic, stenotic, respiratory acidosis, transferred to CCU for BiPAP. Weaned off BiPAP today and now she is on high flow nasal cannulas, feels comfortable. She has poor urinary output, patient's family would be interested in hemodialysis if needed, per Dr Wynelle Link. Foley catheter was placed and patient became anuric. Nephrologist recommended dialysis, but patient refused. Palliative care is consulted, discussed this patient's family extensively and emotional support provided. Time spent approximately 20 minutes on discussions of risks as well as benefits Patient converted into A. fib and is back on amiodarone intravenously now, hematuria continuous despite patient being off heparin drip     Review of Systems  Unable to perform ROS: acuity of condition    VITAL SIGNS: Blood pressure 111/69, pulse 35, temperature 98.8 F (37.1 C), temperature source Core (Comment), resp. rate 21, height 5\' 5"  (1.651 m), weight 73.2 kg (161 lb 6 oz), SpO2 85 %.  PHYSICAL EXAMINATION:   GENERAL:  82 y.o.-year-old patient lying in the bed in moderate respiratory distress. Remains Tachypneic, but more comfortable with high  flow nasal cannulas , remains restless in bed, but overall has pale, chronically sick appearance  EYES: Pupils equal, round, reactive to light and accommodation. No scleral icterus. Extraocular muscles intact.  HEENT: Head atraumatic, normocephalic. Oropharynx and nasopharynx clear.  NECK:  Supple, no jugular venous distention. No thyroid enlargement, no tenderness.  LUNGS: Diminished breath sounds bilaterally at bases, no wheezing, bilateral crepitations anteriorly as well as posteriorly at bases. Using accessory muscles of respiration at rest. Tachypneic. Diminished breath sounds at bases . Less rales overall today  CARDIOVASCULAR: S1, S2 , irregularly irregular. No murmurs, rubs, or gallops.  ABDOMEN: Soft, nontender, nondistended. Bowel sounds present. No organomegaly or mass.  EXTREMITIES: 2+ lower extremity and pedal edema, no cyanosis, or clubbing.  NEUROLOGIC: Cranial nerves II through XII are intact. Muscle strength 5/5 in all extremities. Sensation intact. Gait not checked.  PSYCHIATRIC: The patient is alert ,  uncomfortable , restless.some dyspneic   SKIN: No obvious rash, lesion, or ulcer.   ORDERS/RESULTS REVIEWED:   CBC  Recent Labs Lab 09/15/15 1156 09/19/15 1936 09/20/15 0905 09/21/15 0614  WBC  --  8.8 9.7 9.8  HGB 13.8 14.1 13.2 13.6  HCT  --  45.2 41.4 41.5  PLT  --  232 234 244  MCV  --  98.0 94.3 94.1  MCH  --  30.7 30.0 30.8  MCHC  --  31.3* 31.8* 32.8  RDW  --  17.2* 16.6* 16.4*   ------------------------------------------------------------------------------------------------------------------  Chemistries   Recent Labs Lab 09/15/15 1156  09/16/15 0748  09/18/15 0535 09/19/15 0449 09/19/15 1936 09/20/15 1239 09/21/15 0614  NA 141  --  141  142  < > 140 137 138 139  136  K 3.1*  < > 3.5  3.5  < > 3.5 3.9 4.5 4.2 4.2  CL 99*  --  99*  99*  < > 97* 95* 95* 93* 94*  CO2 36*  --  38*  39*  < > 38* 35* 35* 37* 32  GLUCOSE 121*  --  103*  103*  < >  111* 138* 117* 98 79  BUN 29*  --  29*  29*  < > 41* 49* 53* 62* 68*  CREATININE 1.22*  --  1.42*  1.28*  < > 1.89* 2.53* 3.00* 3.66* 4.25*  CALCIUM 8.6*  --  8.7*  8.7*  < > 8.8* 9.0 8.9 9.1 8.4*  MG 1.7  --  1.6*  --   --  1.9  --   --   --   AST  --   --   --   --   --   --  60*  --   --   ALT  --   --   --   --   --   --  31  --   --   ALKPHOS  --   --   --   --   --   --  117  --   --   BILITOT  --   --   --   --   --   --  0.5  --   --   < > = values in this interval not displayed. ------------------------------------------------------------------------------------------------------------------ estimated creatinine clearance is 8.8 mL/min (by C-G formula based on Cr of 4.25). ------------------------------------------------------------------------------------------------------------------ No results for input(s): TSH, T4TOTAL, T3FREE, THYROIDAB in the last 72 hours.  Invalid input(s): FREET3  Cardiac Enzymes No results for input(s): CKMB, TROPONINI, MYOGLOBIN in the last 168 hours.  Invalid input(s): CK ------------------------------------------------------------------------------------------------------------------ Invalid input(s): POCBNP ---------------------------------------------------------------------------------------------------------------  RADIOLOGY: US Venous Img Upper Uni Right  09/20/2015  CLINICAL DATA:  Right upper extremity edema. EXAM: RIGHT UPPER EXTREMITY VENOUS DOPPLER ULTRASOUND TECHNIQUE: Gray-scale sonography with graded compression, as well as color Doppler and duplex ultrasound were performed to evaluate the upper extremity deep venous system from the level of the subclavian vein and including the jugular, axillary, basilic, radial, ulnar and upper cephalic vein. Spectral Doppler was utilized to evaluate flow at rest and with distal augmentation maneuvers. COMPARISON:  None. FINDINGS: Contralateral Subclavian Vein: Respiratory phasicity is normal and  symmetric with the symptomatic side. No evidence of thrombus. Normal compressibility. Internal Jugular Vein: No evidence of thrombus. Normal compressibility, respiratory phasicity and response to augmentation. Subclavian Vein: No evidence of thrombus. Normal compressibility, respiratory phasicity and response to augmentation. Axillary Vein: No evidence of thrombus. Normal compressibility, respiratory phasicity and response to augmentation. Cephalic Vein: No evidence of thrombus. Normal compressibility, respiratory phasicity and response to augmentation. Basilic Vein: No evidence of thrombus. Normal compressibility, respiratory phasicity and response to augmentation. Brachial Veins: No evidence of thrombus. Normal compressibility, respiratory phasicity and response to augmentation. Radial Veins: No evidence of thrombus. Normal compressibility, respiratory phasicity and response to augmentation. Ulnar Veins: No evidence of thrombus. Normal compressibility, respiratory phasicity and response to augmentation. Venous Reflux:  None visualized. Other Findings: No evidence of superficial thrombophlebitis or abnormal fluid collection. IMPRESSION: No evidence of right upper extremity deep venous thrombosis. Electronically Signed   By: Irish Lack M.D.   On: 09/20/2015 16:31   Dg Chest Port 1 View  09/21/2015  CLINICAL DATA:  Pleural effusion, interval change EXAM: PORTABLE CHEST 1 VIEW  COMPARISON:  09/20/2015 FINDINGS: Left-sided transvenous pacemaker leads to the right atrium and right ventricle. The heart is enlarged. There are bilateral pleural effusions. Bibasilar opacities obscure the hemidiaphragms bilaterally and appear stable. IMPRESSION: 1. Cardiomegaly and bibasilar opacities. 2. Stable appearance. Electronically Signed   By: Norva Pavlov M.D.   On: 09/21/2015 15:14   Dg Chest Port 1 View  09/20/2015  CLINICAL DATA:  Acute respiratory failure, shortness of Breath EXAM: PORTABLE CHEST 1 VIEW COMPARISON:   1/23/ 17 FINDINGS: Cardiomegaly again noted. Persistent mild congestion/ edema. Bilateral small pleural effusion with bilateral basilar atelectasis or infiltrate. Slight increase in right pleural effusion. Dual lead cardiac pacemaker is unchanged in position. IMPRESSION: Persistent mild congestion/ edema. Bilateral small pleural effusion with bilateral basilar atelectasis or infiltrate. Slight increased right pleural effusion. Dual lead cardiac pacemaker is unchanged in position. Electronically Signed   By: Natasha Mead M.D.   On: 09/20/2015 08:54    EKG:  Orders placed or performed during the hospital encounter of 09/15/15  . EKG 12-Lead pre-cardioversion  . EKG 12-Lead  . EKG 12-Lead pre-cardioversion  . EKG 12-Lead  . EKG 12-Lead  . EKG 12-Lead  . EKG 12-Lead  . EKG 12-Lead  . EKG 12-Lead  . EKG 12-Lead    ASSESSMENT AND PLAN:  Principal Problem:   Atrial fibrillation with RVR (HCC) Active Problems:   Acute on chronic diastolic (congestive) heart failure (HCC)   Hypoxia   Encephalopathy acute   Lethargic   Respiratory failure (HCC)   Swelling   Tachycardia  #1 atrial fibrillation, RVR  status post TEE and elective cardioversion 2,  patient is in sinus rhythm now, has been loaded with  amiodarone , back in atrial fibrillation, continue amiodarone orally, cardiologist is following. Patient's heart rate is in 30- 110 . Off  Eliquis , off heparin IV now due to hematuria. Venous ultrasound was negative for DVT.  #2. Acute on chronic diastolic CHF due to atrial fibrillation, of Lasix due to acute on chronic renal failure,  metoprolol is on hold, resume her  medications when blood pressure remains stable. Repeated chest x-ray reveals pulmonary edema, bilateral pleural effusions.  VQ scan intermediate probability for pulmonary embolism. I do not recommend thoracentesis since it is related to acute on chronic diastolic CHF and pleural effusions are small overall .   #3. Lower extremity  edema, Doppler ultrasound revealed no evidence of DVT #4. Acute on chronic renal failure on underlying chronic stage III renal insufficiency, patient is off diuretics now. Poor urinary output, she is anuric and creatinine has climbed today to  wit 4.25 estimated GFR as of 8, hemodialysis is refused   #5. Dementia with behavioral problems. Supportive therapy  #6 generalized weakness, palliative care will be involved  #7.  urinary tract infection , Urinary cultures revealed 80,000 colony-forming units of Proteus mirabilis, sensitive to Keflex orally, continue Keflex for now  #8. Acute respiratory failure with hypoxia  and hypercapnia due to acute on chronic diastolic CHF, now  Off BiPAP and on the high flow nasal cannulas , wean off oxygen as tolerated   #9 hematuria, hold heparin for now until hematuria clea. Stable hemoglobin level   Management plans discussed with the patient, family and they are in agreement.   DRUG ALLERGIES:  Allergies  Allergen Reactions  . Ace Inhibitors     REACTION: cough  . Alendronate Sodium     REACTION: aches  . Avapro [Irbesartan]     hypotension  . Codeine  Dizzy and nauseated   . Dabigatran Etexilate Mesylate Diarrhea    indigestion  . Nitrofurantoin     REACTION: rash  . Penicillins     REACTION: rash  . Prednisone Other (See Comments)    Extreme weakness  . Trazodone And Nefazodone Other (See Comments)    Nightmares     CODE STATUS:     Code Status Orders        Start     Ordered   09/15/15 1415  Do not attempt resuscitation (DNR)   Continuous    Question Answer Comment  In the event of cardiac or respiratory ARREST Do not call a "code blue"   In the event of cardiac or respiratory ARREST Do not perform Intubation, CPR, defibrillation or ACLS   In the event of cardiac or respiratory ARREST Use medication by any route, position, wound care, and other measures to relive pain and suffering. May use oxygen, suction and manual treatment of  airway obstruction as needed for comfort.      09/15/15 1414    Code Status History    Date Active Date Inactive Code Status Order ID Comments User Context   10/11/2014  1:12 PM 10/12/2014 11:05 PM Full Code [id number]  Sheral Apley, MD Inpatient   06/23/2013  4:37 PM 06/26/2013  7:15 PM Full Code [id number]  Esperanza Sheets, MD Inpatient    Advance Directive Documentation        Most Recent Value   Type of Advance Directive  Healthcare Power of Attorney, Living will   Pre-existing out of facility DNR order (yellow form or pink MOST form)     "MOST" Form in Place?        TOTAL CRITICAL CARE TIME TAKING CARE OF THIS patient 50 minutes     prolonged discussion with patient's daughter, emotional support provided. Time spent 15 to 20 minutes   Hamdi Kley M.D on 09/21/2015 at 4:04 PM  Between 7am to 6pm - Pager - [phone]  After 6pm go to [email or web address] - password [email or web address] [facility]  [facility]  Office  [phone]  CC: Primary care physician; Roxy Manns, MD

## 2015-09-21 NOTE — Consult Note (Signed)
MEDICATION RELATED CONSULT NOTE - Follow Up  Pharmacy Consult for Potassium Replacement/ Renal Medication Adjustment  Indication: Hypokalemia  Patient Measurements: Height:  (165.1 cm) Weight: 161 lb 6 oz (73.2 kg) IBW/kg (Calculated) : 57  Vital Signs: Temp: 99 F (37.2 C) (01/26 1336) Temp Source: Core (Comment) (01/26 0800) BP: 126/76 mmHg (01/26 1300) Pulse Rate: 104 (01/26 1336) Intake/Output from previous day: 01/25 0701 - 01/26 0700 In: 177.7 [I.V.:122.7; IV Piggyback:55] Out: 100 [Urine:100] Intake/Output from this shift: Total I/O In: 53 [I.V.:3; IV Piggyback:50] Out: 75 [Urine:75]  Labs:  Recent Labs  09/19/15 0449 09/19/15 1936 09/20/15 0905 09/20/15 1239 09/21/15 0614  WBC  --  8.8 9.7  --  9.8  HGB  --  14.1 13.2  --  13.6  HCT  --  45.2 41.4  --  41.5  PLT  --  232 234  --  244  APTT  --  35 44*  --   --   CREATININE 2.53* 3.00*  --  3.66* 4.25*  MG 1.9  --   --   --   --   ALBUMIN  --  2.1*  --   --   --   PROT  --  5.7*  --   --   --   AST  --  60*  --   --   --   ALT  --  31  --   --   --   ALKPHOS  --  117  --   --   --   BILITOT  --  0.5  --   --   --    Estimated Creatinine Clearance: 8.8 mL/min (by C-G formula based on Cr of 4.25).  Assessment: Pharmacy consulted for renal medication adjustments and potassium replacement for 82 yo female admitted with acute CHF and chronic afib. Patient's renal function has declined in past 72 hours.  Plan:  1. Potassium is within normal limits. Will continue to monitor daily. Would defer supplementation of mild hypokalemia(>/+3) until multidisciplinary rounds.   2. Will decrease frequency of memantine to  daily. No further adjustments warranted at this time.    Pharmacy will continue to monitor and adjust per consult.    Simpson,Michael L, PharmD Clinical Pharmacist 09/21/2015,2:34 PM

## 2015-09-21 NOTE — Progress Notes (Signed)
Initial Nutrition Assessment     INTERVENTION:  Meals and snacks: Cater to pt preferences Medical Nutrition Supplement: Recommend ensure TID for added nutrition   NUTRITION DIAGNOSIS:   Inadequate oral intake related to acute illness as evidenced by meal completion < 25%.    GOAL:   Patient will meet greater than or equal to 90% of their needs    MONITOR:    (Energy intake, Pulmonary profile, Electrolyte and renal profile)  REASON FOR ASSESSMENT:   LOS    ASSESSMENT:      Pt with cardioversion times 2 for afib, UTI, pulmonary edema, elevated renal function but dtr has refused dialysis.  Palliative care to evaluate  Past Medical History  Diagnosis Date  . Allergic rhinitis   . Anxiety   . Depression   . GERD (gastroesophageal reflux disease)   . Hypertension   . Osteopenia   . Bradycardia     a. s/p PPM  . Hyperlipidemia   . Duodenal ulcer   . Diverticulosis   . GI bleed   . PAF (paroxysmal atrial fibrillation) (HCC)     a. s/p TEE/DCCV 09/15/2015; On Eliquis b. repeat DCCV 09/18/2015  . Anemia associated with acute blood loss   . Diverticulosis   . Chronic diastolic CHF (congestive heart failure) (HCC)     a. 08/2015: EF 50-55%    Current Nutrition: eating bites per dtr since admission (day 6)  Food/Nutrition-Related History: dtr reports intake was decreased for few weeks before coming to hospital   Scheduled Medications:  . amiodarone  200 mg Oral Daily  . antiseptic oral rinse  7 mL Mouth Rinse q12n4p  .  ceFAZolin (ANCEF) IV  500 mg Intravenous Q12H  . chlorhexidine  15 mL Mouth Rinse BID  . cholecalciferol  2,000 Units Oral Daily  . donepezil  10 mg Oral Daily  . feeding supplement (ENSURE ENLIVE)  237 mL Oral TID  . memantine  5 mg Oral BID  . mirtazapine  15 mg Oral QHS  . multivitamin with minerals  1 tablet Oral Daily  . pantoprazole  40 mg Oral Daily  . senna  1 tablet Oral QHS  . sertraline  100 mg Oral Daily  . sodium chloride  3  mL Intravenous Q12H    Continuous Medications:  . sodium chloride       Electrolyte/Renal Profile and Glucose Profile:   Recent Labs Lab 09/15/15 1156  09/16/15 0748  09/19/15 0449 09/19/15 1936 09/20/15 1239 09/21/15 0614  NA 141  --  141  142  < > 137 138 139 136  K 3.1*  < > 3.5  3.5  < > 3.9 4.5 4.2 4.2  CL 99*  --  99*  99*  < > 95* 95* 93* 94*  CO2 36*  --  38*  39*  < > 35* 35* 37* 32  BUN 29*  --  29*  29*  < > 49* 53* 62* 68*  CREATININE 1.22*  --  1.42*  1.28*  < > 2.53* 3.00* 3.66* 4.25*  CALCIUM 8.6*  --  8.7*  8.7*  < > 9.0 8.9 9.1 8.4*  MG 1.7  --  1.6*  --  1.9  --   --   --   PHOS  --   --  4.3  --   --   --   --   --   GLUCOSE 121*  --  103*  103*  < > 138* 117* 98 79  < > =  values in this interval not displayed. Protein Profile:  Recent Labs Lab 09/16/15 0748 09/19/15 1936  ALBUMIN 2.0* 2.1*    Gastrointestinal Profile: Last BM:1/23   Nutrition-Focused Physical Exam Findings:  Unable to complete Nutrition-Focused physical exam at this time.  Deferred at this time   Weight Change: dtr reports wt gain prior to admission    Diet Order:  Diet Heart Room service appropriate?: Yes; Fluid consistency:: Thin  Skin:   reviewed   Height:   Ht Readings from Last 1 Encounters:  09/15/15  (1.651 m)    Weight:   Wt Readings from Last 1 Encounters:  09/21/15 161 lb 6 oz (73.2 kg)    Ideal Body Weight:     BMI:  Body mass index is 26.85 kg/(m^2).  Estimated Nutritional Needs:   Kcal:  BEE 1150 kcals (IF 1.0-1.2, AF 1.2) 9604-5409 kcals/d.   Protein:  (1.0-1.2 g/kg) 73-87 g/d  Fluid:  (25-21ml/kg) 1825-2197ml/d  EDUCATION NEEDS:   No education needs identified at this time  MODERATE Care Level  Liahm Grivas B. Freida Busman, RD, LDN [phone] (pager) Weekend/On-Call pager [phone])

## 2015-09-22 ENCOUNTER — Encounter: Payer: Self-pay | Admitting: Internal Medicine

## 2015-09-22 DIAGNOSIS — N179 Acute kidney failure, unspecified: Secondary | ICD-10-CM

## 2015-09-22 LAB — RENAL FUNCTION PANEL
ALBUMIN: 1.8 g/dL — AB (ref 3.5–5.0)
Anion gap: 13 (ref 5–15)
BUN: 79 mg/dL — AB (ref 6–20)
CHLORIDE: 93 mmol/L — AB (ref 101–111)
CO2: 31 mmol/L (ref 22–32)
CREATININE: 5.07 mg/dL — AB (ref 0.44–1.00)
Calcium: 8.5 mg/dL — ABNORMAL LOW (ref 8.9–10.3)
GFR calc non Af Amer: 7 mL/min — ABNORMAL LOW (ref >60)
GFR, EST AFRICAN AMERICAN: 8 mL/min — AB (ref >60)
Glucose, Bld: 67 mg/dL (ref 65–99)
Phosphorus: 5.9 mg/dL — ABNORMAL HIGH (ref 2.5–4.6)
Potassium: 4.2 mmol/L (ref 3.5–5.1)
SODIUM: 137 mmol/L (ref 135–145)

## 2015-09-22 LAB — COMPREHENSIVE METABOLIC PANEL
ALT: 23 U/L (ref 14–54)
AST: 47 U/L — ABNORMAL HIGH (ref 15–41)
Albumin: 1.8 g/dL — ABNORMAL LOW (ref 3.5–5.0)
Alkaline Phosphatase: 125 U/L (ref 38–126)
Anion gap: 14 (ref 5–15)
BUN: 78 mg/dL — ABNORMAL HIGH (ref 6–20)
CHLORIDE: 94 mmol/L — AB (ref 101–111)
CO2: 28 mmol/L (ref 22–32)
Calcium: 8.3 mg/dL — ABNORMAL LOW (ref 8.9–10.3)
Creatinine, Ser: 5 mg/dL — ABNORMAL HIGH (ref 0.44–1.00)
GFR, EST AFRICAN AMERICAN: 8 mL/min — AB (ref >60)
GFR, EST NON AFRICAN AMERICAN: 7 mL/min — AB (ref >60)
Glucose, Bld: 63 mg/dL — ABNORMAL LOW (ref 65–99)
POTASSIUM: 4.2 mmol/L (ref 3.5–5.1)
Sodium: 136 mmol/L (ref 135–145)
Total Bilirubin: 1 mg/dL (ref 0.3–1.2)
Total Protein: 5.1 g/dL — ABNORMAL LOW (ref 6.5–8.1)

## 2015-09-22 LAB — CBC
HCT: 41.4 % (ref 35.0–47.0)
Hemoglobin: 13.9 g/dL (ref 12.0–16.0)
MCH: 31.1 pg (ref 26.0–34.0)
MCHC: 33.5 g/dL (ref 32.0–36.0)
MCV: 92.7 fL (ref 80.0–100.0)
PLATELETS: 251 10*3/uL (ref 150–440)
RBC: 4.46 MIL/uL (ref 3.80–5.20)
RDW: 15.8 % — AB (ref 11.5–14.5)
WBC: 8.5 10*3/uL (ref 3.6–11.0)

## 2015-09-22 MED ORDER — AMIODARONE HCL IN DEXTROSE 360-4.14 MG/200ML-% IV SOLN: 30.0000 mg/h | INTRAVENOUS | Status: DC

## 2015-09-22 MED ORDER — MORPHINE SULFATE (CONCENTRATE) 10 MG/0.5ML PO SOLN
10.0000 mg | ORAL | Status: AC | PRN
Start: 2015-09-22 — End: ?

## 2015-09-22 MED ORDER — LORAZEPAM 2 MG/ML PO CONC: 1.0000 mg | Freq: Four times a day (QID) | ORAL | Status: AC | PRN

## 2015-09-22 MED ORDER — DEXTROSE 5 % IV SOLN
500.0000 mg | INTRAVENOUS | Status: DC
  Filled 2015-09-22: qty 5

## 2015-09-22 MED ORDER — LORAZEPAM 2 MG/ML IJ SOLN: 1.0000 mg | INTRAMUSCULAR | Status: DC | PRN

## 2015-09-22 MED ORDER — MORPHINE SULFATE (PF) 2 MG/ML IV SOLN
2.0000 mg | INTRAVENOUS | Status: DC | PRN
  Administered 2015-09-23 (×2): 2 mg via INTRAVENOUS
  Filled 2015-09-22 (×2): qty 1

## 2015-09-22 MED ORDER — AMIODARONE HCL IN DEXTROSE 360-4.14 MG/200ML-% IV SOLN
60.0000 mg/h | INTRAVENOUS | Status: DC
  Administered 2015-09-22: 60 mg/h via INTRAVENOUS
  Filled 2015-09-22: qty 200

## 2015-09-22 NOTE — Progress Notes (Signed)
[facility] [place] [facility]   PATIENT NAME: [name] [name]    MR#:  [id number]  DATE OF BIRTH:  1923/08/12  SUBJECTIVE: I feel good  CHIEF COMPLAINT:  No chief complaint on file.  patient is a 82 year old Caucasian female with past medical history significant for history of atrial fibrillation for which she underwent elective cardioversion with Dr. Kirke Corin. She remained in sinus rhythm for about one hour and then converted back into atrial fibrillation, became short of breath and presented  to the hospital hypoxic and in CHF. She was diuresed approximately 700 cc since admission. Patient was loaded with amiodarone and underwent elective cardioversion 23rd of January again, converted into sinus rhythm and remains in sinus rhythm. Poorly responsive yesterday, , dyspneic, stenotic, respiratory acidosis, transferred to CCU for BiPAP.  She was noted to have poor urinary output, patient's family decided to again start dialysis and to initiate comfort care measures, patient will be transferred to 1 cc for final care, reassess tomorrow and discharged to hospice home if remains stable    Review of Systems  Unable to perform ROS: acuity of condition    VITAL SIGNS: Blood pressure 131/85, pulse 118, temperature 98.5 F (36.9 C), temperature source Axillary, resp. rate 24, height 5\' 5"  (1.651 m), weight 74.8 kg (164 lb 14.5 oz), SpO2 95 %.  PHYSICAL EXAMINATION:   GENERAL:  82 y.o.-year-old patient lying in the bed in moderate respiratory distress. Remains Tachypneic, now on BiPAP and unresponsive  EYES: Pupils equal, round, reactive to light and accommodation. No scleral icterus. Extraocular muscles intact.  HEENT: Head atraumatic, normocephalic. Oropharynx and nasopharynx clear.  NECK:  Supple, no jugular venous distention. No thyroid enlargement, no tenderness.  LUNGS: Diminished breath sounds bilaterally at bases, no wheezing, bilateral crepitations anteriorly as  well as posteriorly at bases. Using accessory muscles of respiration at rest. Tachypneic. Diminished breath sounds at bases . Uncomfortable, struggling  CARDIOVASCULAR: S1, S2 , irregularly irregular. No murmurs, rubs, or gallops.  ABDOMEN: Soft, nontender, nondistended. Bowel sounds present. No organomegaly or mass.  EXTREMITIES: 2+ lower extremity and pedal edema, no cyanosis, or clubbing.  NEUROLOGIC: Cranial nerves unresponsive, not able to evaluate . Muscle strength  able to evaluate this patient is flaccid . unresponsive. Sensationot able to evaluate . Gait not checked.  PSYCHIATRIC: The patient is unresponsive   SKIN: No obvious rash, lesion, or ulcer.   ORDERS/RESULTS REVIEWED:   CBC  Recent Labs Lab 09/19/15 1936 09/20/15 0905 09/21/15 0614 09/22/15 0433  WBC 8.8 9.7 9.8 8.5  HGB 14.1 13.2 13.6 13.9  HCT 45.2 41.4 41.5 41.4  PLT 232 234 244 251  MCV 98.0 94.3 94.1 92.7  MCH 30.7 30.0 30.8 31.1  MCHC 31.3* 31.8* 32.8 33.5  RDW 17.2* 16.6* 16.4* 15.8*   ------------------------------------------------------------------------------------------------------------------  Chemistries   Recent Labs Lab 09/16/15 0748  09/19/15 0449 09/19/15 1936 09/20/15 1239 09/21/15 0614 09/22/15 0433  NA 141  142  < > 137 138 139 136 136  137  K 3.5  3.5  < > 3.9 4.5 4.2 4.2 4.2  4.2  CL 99*  99*  < > 95* 95* 93* 94* 94*  93*  CO2 38*  39*  < > 35* 35* 37* 32 28  31  GLUCOSE 103*  103*  < > 138* 117* 98 79 63*  67  BUN 29*  29*  < > 49* 53* 62* 68* 78*  79*  CREATININE 1.42*  1.28*  < >  2.53* 3.00* 3.66* 4.25* 5.00*  5.07*  CALCIUM 8.7*  8.7*  < > 9.0 8.9 9.1 8.4* 8.3*  8.5*  MG 1.6*  --  1.9  --   --   --   --   AST  --   --   --  60*  --   --  47*  ALT  --   --   --  31  --   --  23  ALKPHOS  --   --   --  117  --   --  125  BILITOT  --   --   --  0.5  --   --  1.0  < > = values in this interval not  displayed. ------------------------------------------------------------------------------------------------------------------ estimated creatinine clearance is 7.5 mL/min (by C-G formula based on Cr of 5.07). ------------------------------------------------------------------------------------------------------------------ No results for input(s): TSH, T4TOTAL, T3FREE, THYROIDAB in the last 72 hours.  Invalid input(s): FREET3  Cardiac Enzymes No results for input(s): CKMB, TROPONINI, MYOGLOBIN in the last 168 hours.  Invalid input(s): CK ------------------------------------------------------------------------------------------------------------------ Invalid input(s): POCBNP ---------------------------------------------------------------------------------------------------------------  RADIOLOGY: US Venous Img Upper Uni Right  09/20/2015  CLINICAL DATA:  Right upper extremity edema. EXAM: RIGHT UPPER EXTREMITY VENOUS DOPPLER ULTRASOUND TECHNIQUE: Gray-scale sonography with graded compression, as well as color Doppler and duplex ultrasound were performed to evaluate the upper extremity deep venous system from the level of the subclavian vein and including the jugular, axillary, basilic, radial, ulnar and upper cephalic vein. Spectral Doppler was utilized to evaluate flow at rest and with distal augmentation maneuvers. COMPARISON:  None. FINDINGS: Contralateral Subclavian Vein: Respiratory phasicity is normal and symmetric with the symptomatic side. No evidence of thrombus. Normal compressibility. Internal Jugular Vein: No evidence of thrombus. Normal compressibility, respiratory phasicity and response to augmentation. Subclavian Vein: No evidence of thrombus. Normal compressibility, respiratory phasicity and response to augmentation. Axillary Vein: No evidence of thrombus. Normal compressibility, respiratory phasicity and response to augmentation. Cephalic Vein: No evidence of thrombus. Normal  compressibility, respiratory phasicity and response to augmentation. Basilic Vein: No evidence of thrombus. Normal compressibility, respiratory phasicity and response to augmentation. Brachial Veins: No evidence of thrombus. Normal compressibility, respiratory phasicity and response to augmentation. Radial Veins: No evidence of thrombus. Normal compressibility, respiratory phasicity and response to augmentation. Ulnar Veins: No evidence of thrombus. Normal compressibility, respiratory phasicity and response to augmentation. Venous Reflux:  None visualized. Other Findings: No evidence of superficial thrombophlebitis or abnormal fluid collection. IMPRESSION: No evidence of right upper extremity deep venous thrombosis. Electronically Signed   By: Irish Lack M.D.   On: 09/20/2015 16:31   Dg Chest Port 1 View  09/21/2015  CLINICAL DATA:  Pleural effusion, interval change EXAM: PORTABLE CHEST 1 VIEW COMPARISON:  09/20/2015 FINDINGS: Left-sided transvenous pacemaker leads to the right atrium and right ventricle. The heart is enlarged. There are bilateral pleural effusions. Bibasilar opacities obscure the hemidiaphragms bilaterally and appear stable. IMPRESSION: 1. Cardiomegaly and bibasilar opacities. 2. Stable appearance. Electronically Signed   By: Norva Pavlov M.D.   On: 09/21/2015 15:14    EKG:  Orders placed or performed during the hospital encounter of 09/15/15  . EKG 12-Lead pre-cardioversion  . EKG 12-Lead  . EKG 12-Lead pre-cardioversion  . EKG 12-Lead  . EKG 12-Lead  . EKG 12-Lead  . EKG 12-Lead  . EKG 12-Lead  . EKG 12-Lead  . EKG 12-Lead    ASSESSMENT AND PLAN:  Principal Problem:   Atrial fibrillation with RVR (HCC) Active Problems:   Acute  on chronic diastolic (congestive) heart failure (HCC)   Hypoxia   Acute respiratory failure with hypoxia and hypercarbia (HCC)   Tachycardia   Encephalopathy acute   Lethargic   Pleural effusion   Swelling  #1 atrial fibrillation,  RVR  status post TEE and elective cardioversion 2,  patient  Was in sinus rhythm, back in atrial fibrillation, we are stopping the patient's oral medications since patient is unresponsive and the initiating comfort care measures   #2. Acute on chronic diastolic CHF due to atrial fibrillation, of Lasix due to acute on chronic renal failure.  Repeated chest x-ray reveals pulmonary edema, bilateral pleural effusions.  VQ scan intermediate probability for pulmonary embolism. Initiated comfort care measures today. Discussed this patient's family and care management.   #3. Lower extremity edema, Doppler ultrasound revealed no evidence of DVT #4. Acute on chronic renal failure on underlying chronic stage III renal insufficiency, patient is off diuretics now. Poor urinary output, she is anuric and creatinine has climbed today to  wit 5.07,  estimated GFR as of 7, hemodialysis is refused   #5. Dementia with behavioral problems. Supportive therapy  #6 generalized weakness, palliative carinvolved and patient's family decided on comfort care measures , hospice is consulted , possible transfer to hospice home tomorrow if patient remains stable   #7.  urinary tract infection , Urinary cultures revealed 80,000 colony-forming units of Proteus mirabilis,  supportive therapy only  #8. Acute respiratory failure with hypoxia  and hypercapnia due to acute on chronic diastolic CHF, . Continue oxygen through nasal cannulas   #9 hematuria, off heparin , Hemoglobin level remains stable   Management plans discussed with the patient, family and they are in agreement.   DRUG ALLERGIES:  Allergies  Allergen Reactions  . Ace Inhibitors     REACTION: cough  . Alendronate Sodium     REACTION: aches  . Avapro [Irbesartan]     hypotension  . Codeine     Dizzy and nauseated   . Dabigatran Etexilate Mesylate Diarrhea    indigestion  . Nitrofurantoin     REACTION: rash  . Penicillins     REACTION: rash  . Prednisone Other  (See Comments)    Extreme weakness  . Trazodone And Nefazodone Other (See Comments)    Nightmares     CODE STATUS:     Code Status Orders        Start     Ordered   09/15/15 1415  Do not attempt resuscitation (DNR)   Continuous    Question Answer Comment  In the event of cardiac or respiratory ARREST Do not call a "code blue"   In the event of cardiac or respiratory ARREST Do not perform Intubation, CPR, defibrillation or ACLS   In the event of cardiac or respiratory ARREST Use medication by any route, position, wound care, and other measures to relive pain and suffering. May use oxygen, suction and manual treatment of airway obstruction as needed for comfort.      09/15/15 1414    Code Status History    Date Active Date Inactive Code Status Order ID Comments User Context   10/11/2014  1:12 PM 10/12/2014 11:05 PM Full Code [id number]  Sheral Apley, MD Inpatient   06/23/2013  4:37 PM 06/26/2013  7:15 PM Full Code [id number]  Esperanza Sheets, MD Inpatient    Advance Directive Documentation        Most Recent Value   Type of Advance Directive  Healthcare Power  of Attorney, Living will   Pre-existing out of facility DNR order (yellow form or pink MOST form)     "MOST" Form in Place?        TOTAL CRITICAL CARE TIME TAKING CARE OF THIS patient 50 minutes   Discussed with patient's family, emotional support provided   Daana Petrasek M.D on 09/22/2015 at 3:49 PM  Between 7am to 6pm - Pager - [phone]  After 6pm go to [email or web address] - password [email or web address]  [facility]  Office  [phone]  CC: Primary care physician; Roxy Manns, MD

## 2015-09-22 NOTE — Progress Notes (Signed)
PULMONARY / CRITICAL CARE MEDICINE   Name: [name] [name] MRN: [id number] DOB: 12-23-1922    ADMISSION DATE:  09/15/2015  Consulting Physician - Dr. Winona Legato  BRIEF HISTORY: 09/19/15 History per chart review and family members at the bedside 82 y.o. female with a known history of dementia, tachybradycardia syndrome status post pacemaker placement, hypertension, history of chronic atrial fibrillation on anticoagulation with eliquis, history of GI bleed in the past underwent elective cardioversion by Dr.arida on 08/2015. Patient received cardioversion with 120 J after she was given anesthesia. Post procedure patient remained hypoxic with sats anywhere from 85-90% on room air, she was managed by hospitalist and cardiology on the medical unit. Records show that she was being treated for CHF exacerbation with diuresis, but continued to feel dyspneic and weak, also noted to still be in a.fib.  She was load with amiodarone and underwent another elective cardioversion 09/18/15, converted to NSR, but severely short of breath. Overnight she was noted to have increased lethargy and sob, requiring bipap. ABG showed respiratory failure with hypercapnia.   SUBJECTIVE:  Improving mentation overnight, following commands, family at bedside. ABG with still with hypercapnia, but much improved.    SIGNIFICANT EVENTS: 1/24>>lethary, requiring bipap, transferred to ICU for hypercapnic resp failure.  1/20>>cardioversion, NSR for 1 hour, then went back into afib 1/23>>cardioversion again, NSR 1/23 V\Q scan >> Intermediate probability of pulmonary embolism based on PIOPED II criteria (triple matched large segmental perfusion defects involving the lower lung zones bilaterally). These defects correspond to the large bilateral pleural effusions, left greater than right  VITAL SIGNS: Temp:  [98.8 F (37.1 C)-99 F (37.2 C)] 98.8 F (37.1 C) (01/27 0822) Pulse Rate:  [35-133] 127 (01/27 0822) Resp:  [16-28] 18  (01/27 0822) BP: (97-131)/(58-86) 104/58 mmHg (01/27 0800) SpO2:  [85 %-99 %] 97 % (01/27 0822) FiO2 (%):  [50 %-55 %] 50 % (01/26 1900) Weight:  [164 lb 14.5 oz (74.8 kg)] 164 lb 14.5 oz (74.8 kg) (01/27 0500) HEMODYNAMICS:   VENTILATOR SETTINGS: Vent Mode:  [-]  FiO2 (%):  [50 %-55 %] 50 % INTAKE / OUTPUT:  Intake/Output Summary (Last 24 hours) at 09/22/15 1234 Last data filed at 09/22/15 0500  Gross per 24 hour  Intake     55 ml  Output    175 ml  Net   -120 ml    Review of Systems  Unable to perform ROS: critical illness    Physical Exam  Constitutional:  Lethargic on Bipap  HENT:  Head: Normocephalic and atraumatic.  Right Ear: External ear normal.  Left Ear: External ear normal.  Eyes: EOM are normal. Right eye exhibits no discharge.  Neck: Normal range of motion. Neck supple. No tracheal deviation present. No thyromegaly present.  Cardiovascular: Normal rate, regular rhythm, normal heart sounds and intact distal pulses.   No murmur heard. Pulmonary/Chest:  Currently wearing bipap, coarse upper airway sounds, crackles bilaterally to the mid fields, lethargic appearance.   Abdominal: Soft. Bowel sounds are normal. She exhibits no distension.  Musculoskeletal: Normal range of motion.  Neurological:  lethargic  Skin: Skin is warm and dry.  Nursing note and vitals reviewed.    LABS:  CBC  Recent Labs Lab 09/20/15 0905 09/21/15 0614 09/22/15 0433  WBC 9.7 9.8 8.5  HGB 13.2 13.6 13.9  HCT 41.4 41.5 41.4  PLT 234 244 251   Coag's  Recent Labs Lab 09/19/15 1936 09/20/15 0905  APTT 35 44*  INR 1.51  --  BMET  Recent Labs Lab 09/20/15 1239 09/21/15 0614 09/22/15 0433  NA 139 136 136  137  K 4.2 4.2 4.2  4.2  CL 93* 94* 94*  93*  CO2 37* 32 28  31  BUN 62* 68* 78*  79*  CREATININE 3.66* 4.25* 5.00*  5.07*  GLUCOSE 98 79 63*  67   Electrolytes  Recent Labs Lab 09/16/15 0748  09/19/15 0449  09/20/15 1239 09/21/15 0614  09/22/15 0433  CALCIUM 8.7*  8.7*  < > 9.0  < > 9.1 8.4* 8.3*  8.5*  MG 1.6*  --  1.9  --   --   --   --   PHOS 4.3  --   --   --   --   --  5.9*  < > = values in this interval not displayed. Sepsis Markers No results for input(s): LATICACIDVEN, PROCALCITON, O2SATVEN in the last 168 hours. ABG  Recent Labs Lab 09/20/15 0840 09/20/15 1416 09/21/15 0506  PHART 7.32* 7.25* 7.39  PCO2ART 71* 80* 56*  PO2ART 92 62* 76*   Liver Enzymes  Recent Labs Lab 09/16/15 0748 09/19/15 1936 09/22/15 0433  AST  --  60* 47*  ALT  --  31 23  ALKPHOS  --  117 125  BILITOT  --  0.5 1.0  ALBUMIN 2.0* 2.1* 1.8*  1.8*   Cardiac Enzymes No results for input(s): TROPONINI, PROBNP in the last 168 hours. Glucose No results for input(s): GLUCAP in the last 168 hours.  Imaging Dg Chest Port 1 View  09/21/2015  CLINICAL DATA:  Pleural effusion, interval change EXAM: PORTABLE CHEST 1 VIEW COMPARISON:  09/20/2015 FINDINGS: Left-sided transvenous pacemaker leads to the right atrium and right ventricle. The heart is enlarged. There are bilateral pleural effusions. Bibasilar opacities obscure the hemidiaphragms bilaterally and appear stable. IMPRESSION: 1. Cardiomegaly and bibasilar opacities. 2. Stable appearance. Electronically Signed   By: Norva Pavlov M.D.   On: 09/21/2015 15:14    LINES:   CULTURES: Ur Cx 1/22>>Proteus  ANTIBIOTICS Keflex 1/22  ASSESSMENT / PLAN: 82 yo female with Afib, s/p cardioverison (DCCV x 2), dCHF, tachy-brady syndrome, CKDIII, admitted to the ICU with acute hypercapnic respiratory failure and bilateral pleural effusions.   PULMONARY Hypercapnic respiratory failure - now improving - still requiring bipap, however with worsening BUN/Cr patient is now lethargic, and effectively ventilating.  Bilateral Pleural effusion P:  - cont with bipap, May take breaks today off bipap, with HFNC - maintain O2 sats > 90% - DNR - monitor I\Os - Pleural effusion not  assisting with gas exchange. Would not recommend intubation given advance age, deconditioning, bilateral pleural effusions, CHF,and renal insufficiency.  Her CO2 is improving with non-invasive measures.  - V/Q scan consistent with pleural effusion - Still with moderate pleural effusion - may consider 1 time Left sided thoracentesis, however high risk for pneumothorax and re-expansion pulmonary edema.  -Family has decided on comfort care, RN Mardene Celeste witness conversation  CARDIOVASCULAR Paroxysmal Afib dCHF - acute on chronic Tachybrady Syndrome P:  - s/p DCCV x 2 - on amiodarone - cardiology following along  RENAL Acute renal insufficiency CKD III UTI P: - monitor I\O - cont with keflex - patient and family decided to NOT pursue dialysis  GASTROINTESTINAL SUP  P:  PPI  HEMATOLOGIC - montior CBC   NEUROLOGIC A: Acute encephalopathy - metabolic P:  RASS goal: 0  SOCIAL - family has decided on comfort care, will initiate orders - palliative will see  patient and family later today - possible home hospice if still in hospital on Monday.     I have personally obtained a history, examined the patient, evaluated laboratory and imaging results, formulated the assessment and plan and placed orders.  Critical Care Time devoted to patient care services described in this note is 35 minutes.   Overall, patient is critically ill, prognosis is guarded. Patient at high risk for cardiac arrest and death.   Stephanie Acre, MD [facility] Pager [phone] (please enter 7-digits) On Call Pager - [phone] (please enter 7-digits)     09/19/2015, 3:02 PM  Note: This note was prepared with Dragon dictation along with smaller phrase technology. Any transcriptional errors that result from this process are unintentional.

## 2015-09-22 NOTE — Plan of Care (Signed)
Problem: Health Behavior/Discharge Planning: Goal: Ability to manage health-related needs will improve for discharge Outcome: Not Progressing Tx from ICU, Pt remains comfort care, resting comfortably, has not required any PRN meds this shift, pt repositioned for comfort, foley in place and draining minimal amt of urine

## 2015-09-22 NOTE — Consult Note (Signed)
MEDICATION RELATED CONSULT NOTE - Follow Up  Pharmacy Consult for Potassium Replacement/ Renal Medication Adjustment  Indication: Hypokalemia  Patient Measurements: Height:  (165.1 cm) Weight: 164 lb 14.5 oz (74.8 kg) IBW/kg (Calculated) : 57  Vital Signs: Temp: 98.5 F (36.9 C) (01/27 1337) Temp Source: Axillary (01/27 1337) BP: 131/85 mmHg (01/27 1400) Pulse Rate: 118 (01/27 1400) Intake/Output from previous day: 01/26 0701 - 01/27 0700 In: 108 [I.V.:3; IV Piggyback:105] Out: 250 [Urine:250] Intake/Output from this shift: Total I/O In: 117.6 [I.V.:117.6] Out: 350 [Urine:350]  Labs:  Recent Labs  09/19/15 1936 09/20/15 0905 09/20/15 1239 09/21/15 0614 09/22/15 0433  WBC 8.8 9.7  --  9.8 8.5  HGB 14.1 13.2  --  13.6 13.9  HCT 45.2 41.4  --  41.5 41.4  PLT 232 234  --  244 251  APTT 35 44*  --   --   --   CREATININE 3.00*  --  3.66* 4.25* 5.00*  5.07*  PHOS  --   --   --   --  5.9*  ALBUMIN 2.1*  --   --   --  1.8*  1.8*  PROT 5.7*  --   --   --  5.1*  AST 60*  --   --   --  47*  ALT 31  --   --   --  23  ALKPHOS 117  --   --   --  125  BILITOT 0.5  --   --   --  1.0   Estimated Creatinine Clearance: 7.5 mL/min (by C-G formula based on Cr of 5.07).  Assessment: Pharmacy consulted for renal medication adjustments and potassium replacement for 82 yo female admitted with acute CHF and chronic afib. Patient's renal function has declined in past 72 hours.  Plan:  Patient transitioning to comfort measures. No potassium supplementation needed for a few days and no medications requiring adjustment. Will sign off.     Valentina Gu, PharmD Clinical Pharmacist 09/22/2015,3:59 PM

## 2015-09-22 NOTE — Care Management (Signed)
Family has now decided to take patient home under [facility].  She is being made comfort care and will be taken off bipap.  There is concern that she will not survive the transport home at this time. Plan is for patient to transfer to 1C, monitor status as taken off bipap and placed on nasal cannula and establish effective symptom management regime.  Will reassess later this day or morning to determine if it is felt patient would survive the transport home.  Diannia Ruder with [facility] will coordinate need for home 02 and hospital bed but will be delivered on day of discharge.

## 2015-09-22 NOTE — Progress Notes (Signed)
Pt txing to 105 per MD order, pt full comfort care, pt tol well, family at [facility], pt answering yes and no questions, family at [facility], report called to receiving RN all questions answered, nursing will cont to monitor

## 2015-09-22 NOTE — Progress Notes (Signed)
[facility]  ROUNDING NOTE   Subjective:   Family at bedside. UOP 250 Creatinine 5.07 (4.25)  To meet with palliative care at 3pm.  Cards considering amiodarone  Objective:  Vital signs in last 24 hours:  Temp:  [98.2 F (36.8 C)-99 F (37.2 C)] 98.8 F (37.1 C) (01/27 0822) Pulse Rate:  [35-133] 127 (01/27 0822) Resp:  [16-28] 18 (01/27 0822) BP: (97-131)/(58-86) 104/58 mmHg (01/27 0800) SpO2:  [85 %-99 %] 97 % (01/27 0822) FiO2 (%):  [50 %-55 %] 50 % (01/26 1900) Weight:  [74.8 kg (164 lb 14.5 oz)] 74.8 kg (164 lb 14.5 oz) (01/27 0500)  Weight change: 1.6 kg (3 lb 8.4 oz) Filed Weights   09/20/15 0500 09/21/15 0500 09/22/15 0500  Weight: 68 kg (149 lb 14.6 oz) 73.2 kg (161 lb 6 oz) 74.8 kg (164 lb 14.5 oz)    Intake/Output: I/O last 3 completed shifts: In: 163 [I.V.:3; IV Piggyback:160] Out: 300 [Urine:300]   Intake/Output this shift:     Physical Exam: General: Critically ill   Head: Normocephalic, atraumatic. +BIPAP  Eyes: Anicteric, PERRL  Neck: Supple, trachea midline, +JVD  Lungs:  Bilateral crackles  Heart: Paced rhythm  Abdomen:  Soft, nontender,   Extremities: 1+ peripheral edema.  Neurologic: Lethargic, answering questions  Skin: No lesions  Access: none    Basic Metabolic Panel:  Recent Labs Lab 09/15/15 1156  09/16/15 0748  09/19/15 0449 09/19/15 1936 09/20/15 1239 09/21/15 0614 09/22/15 0433  NA 141  --  141  142  < > 137 138 139 136 137  K 3.1*  < > 3.5  3.5  < > 3.9 4.5 4.2 4.2 4.2  CL 99*  --  99*  99*  < > 95* 95* 93* 94* 93*  CO2 36*  --  38*  39*  < > 35* 35* 37* 32 31  GLUCOSE 121*  --  103*  103*  < > 138* 117* 98 79 67  BUN 29*  --  29*  29*  < > 49* 53* 62* 68* 79*  CREATININE 1.22*  --  1.42*  1.28*  < > 2.53* 3.00* 3.66* 4.25* 5.07*  CALCIUM 8.6*  --  8.7*  8.7*  < > 9.0 8.9 9.1 8.4* 8.5*  MG 1.7  --  1.6*  --  1.9  --   --   --   --   PHOS  --   --  4.3  --   --   --   --   --  5.9*  < > =  values in this interval not displayed.  Liver Function Tests:  Recent Labs Lab 09/16/15 0748 09/19/15 1936 09/22/15 0433  AST  --  60*  --   ALT  --  31  --   ALKPHOS  --  117  --   BILITOT  --  0.5  --   PROT  --  5.7*  --   ALBUMIN 2.0* 2.1* 1.8*   No results for input(s): LIPASE, AMYLASE in the last 168 hours. No results for input(s): AMMONIA in the last 168 hours.  CBC:  Recent Labs Lab 09/15/15 1156 09/19/15 1936 09/20/15 0905 09/21/15 0614 09/22/15 0433  WBC  --  8.8 9.7 9.8 8.5  HGB 13.8 14.1 13.2 13.6 13.9  HCT  --  45.2 41.4 41.5 41.4  MCV  --  98.0 94.3 94.1 92.7  PLT  --  232 234 244 251    Cardiac Enzymes:  Recent Labs Lab 09/20/15 0856  CKTOTAL 29*    BNP: Invalid input(s): POCBNP  CBG: No results for input(s): GLUCAP in the last 168 hours.  Microbiology: Results for orders placed or performed during the hospital encounter of 09/15/15  Urine culture     Status: None   Collection Time: 09/17/15  4:17 PM  Result Value Ref Range Status   Specimen Description URINE, CLEAN CATCH  Final   Special Requests Normal  Final   Culture 80,000 COLONIES/ml PROTEUS MIRABILIS  Final   Report Status 09/19/2015 FINAL  Final   Organism ID, Bacteria PROTEUS MIRABILIS  Final      Susceptibility   Proteus mirabilis - MIC*    AMPICILLIN <=2 SENSITIVE Sensitive     CEFAZOLIN <=4 SENSITIVE Sensitive     CEFTRIAXONE <=1 SENSITIVE Sensitive     CIPROFLOXACIN <=0.25 SENSITIVE Sensitive     GENTAMICIN <=1 SENSITIVE Sensitive     IMIPENEM <=0.25 SENSITIVE Sensitive     NITROFURANTOIN 128 RESISTANT Resistant     TRIMETH/SULFA <=20 SENSITIVE Sensitive     CEFTAZIDIME Value in next row Sensitive      SENSITIVE<=1    PIP/TAZO Value in next row Sensitive      SENSITIVE<=4    AMPICILLIN/SULBACTAM Value in next row Sensitive      SENSITIVE<=2    * 80,000 COLONIES/ml PROTEUS MIRABILIS  MRSA PCR Screening     Status: None   Collection Time: 09/21/15  4:18 PM  Result  Value Ref Range Status   MRSA by PCR NEGATIVE NEGATIVE Final    Comment:        The GeneXpert MRSA Assay (FDA approved for NASAL specimens only), is one component of a comprehensive MRSA colonization surveillance program. It is not intended to diagnose MRSA infection nor to guide or monitor treatment for MRSA infections.     Coagulation Studies:  Recent Labs  09/19/15 1936  LABPROT 18.3*  INR 1.51    Urinalysis: No results for input(s): COLORURINE, LABSPEC, PHURINE, GLUCOSEU, HGBUR, BILIRUBINUR, KETONESUR, PROTEINUR, UROBILINOGEN, NITRITE, LEUKOCYTESUR in the last 72 hours.  Invalid input(s): APPERANCEUR    Imaging: US Venous Img Upper Uni Right  09/20/2015  CLINICAL DATA:  Right upper extremity edema. EXAM: RIGHT UPPER EXTREMITY VENOUS DOPPLER ULTRASOUND TECHNIQUE: Gray-scale sonography with graded compression, as well as color Doppler and duplex ultrasound were performed to evaluate the upper extremity deep venous system from the level of the subclavian vein and including the jugular, axillary, basilic, radial, ulnar and upper cephalic vein. Spectral Doppler was utilized to evaluate flow at rest and with distal augmentation maneuvers. COMPARISON:  None. FINDINGS: Contralateral Subclavian Vein: Respiratory phasicity is normal and symmetric with the symptomatic side. No evidence of thrombus. Normal compressibility. Internal Jugular Vein: No evidence of thrombus. Normal compressibility, respiratory phasicity and response to augmentation. Subclavian Vein: No evidence of thrombus. Normal compressibility, respiratory phasicity and response to augmentation. Axillary Vein: No evidence of thrombus. Normal compressibility, respiratory phasicity and response to augmentation. Cephalic Vein: No evidence of thrombus. Normal compressibility, respiratory phasicity and response to augmentation. Basilic Vein: No evidence of thrombus. Normal compressibility, respiratory phasicity and response to  augmentation. Brachial Veins: No evidence of thrombus. Normal compressibility, respiratory phasicity and response to augmentation. Radial Veins: No evidence of thrombus. Normal compressibility, respiratory phasicity and response to augmentation. Ulnar Veins: No evidence of thrombus. Normal compressibility, respiratory phasicity and response to augmentation. Venous Reflux:  None visualized. Other Findings: No evidence of superficial thrombophlebitis or abnormal fluid collection. IMPRESSION:  No evidence of right upper extremity deep venous thrombosis. Electronically Signed   By: Aletta Edouard M.D.   On: 09/20/2015 16:31   Dg Chest Port 1 View  09/21/2015  CLINICAL DATA:  Pleural effusion, interval change EXAM: PORTABLE CHEST 1 VIEW COMPARISON:  09/20/2015 FINDINGS: Left-sided transvenous pacemaker leads to the right atrium and right ventricle. The heart is enlarged. There are bilateral pleural effusions. Bibasilar opacities obscure the hemidiaphragms bilaterally and appear stable. IMPRESSION: 1. Cardiomegaly and bibasilar opacities. 2. Stable appearance. Electronically Signed   By: Nolon Nations M.D.   On: 09/21/2015 15:14     Medications:   . sodium chloride     . amiodarone  200 mg Oral Daily  . antiseptic oral rinse  7 mL Mouth Rinse q12n4p  .  ceFAZolin (ANCEF) IV  500 mg Intravenous Q24H  . chlorhexidine  15 mL Mouth Rinse BID  . cholecalciferol  2,000 Units Oral Daily  . donepezil  10 mg Oral Daily  . feeding supplement (ENSURE ENLIVE)  237 mL Oral TID  . memantine  5 mg Oral Daily  . mirtazapine  15 mg Oral QHS  . multivitamin with minerals  1 tablet Oral Daily  . pantoprazole  40 mg Oral Daily  . senna  1 tablet Oral QHS  . sertraline  100 mg Oral Daily  . sodium chloride  3 mL Intravenous Q12H   ALPRAZolam, ondansetron **OR** ondansetron (ZOFRAN) IV, sodium chloride  Assessment/ Plan:  [name] [name] is a 82 y.o.white female with congestive heart failure, atrial  fibrillation, hyperlipidemia, hypertension, who was admitted to [facility] on 09/15/2015   1. Acute renal failure on chronic kidney disease stage III: baseline creatinine of 1.1, eGFR of 49 from 07/13/2015.  Acute renal failure from overdiuresis and now progressing. metabolic alkalosis improving. Patient states that she does not want dialysis at this time. She and family understands the risks.   2. Hypertension: better control today, hypotensive recently. Takes furosemide and metoprolol as outpatient.  - currently not on any blood pressure agents.   3. Acute exacerbation of congestive heart failure with acute respiratory failure on BiPap: complicated with bilateral pleural effusions.  - unclear if thoracentesis will be tolerated.  - holding diuretics for now.   4. Urinary tract infection: proteus growing on urine culture. Urinalysis was not sufficiently collected.  - cefazolin.  At this time, will sign off. Please call with questions.     LOS: Dunnell, [name] [name] 1/27/20179:09 AM

## 2015-09-22 NOTE — Progress Notes (Signed)
Patient: [name] [name] / Admit Date: 09/15/2015 / Date of Encounter: 09/22/2015, 8:58 AM   Subjective: On BiPAP. Family to meet with Palliative Care this afternoon to discuss goals of care. She and family continue to decline dialysis. Renal function continues to decline. Heart rate in the 120's to 130's in Afib with RVR. Volume overloaded. Somnolent.   Review of Systems: Review of Systems  Unable to perform ROS: acuity of condition     Objective: Telemetry: Afib with RVR, 120's to 130's Physical Exam: Blood pressure 104/58, pulse 127, temperature 98.8 F (37.1 C), temperature source Axillary, resp. rate 18, height  (1.651 m), weight 164 lb 14.5 oz (74.8 kg), SpO2 97 %. Body mass index is 27.44 kg/(m^2). General: Critically ill appearing. Head: Normocephalic, atraumatic, sclera non-icteric, no xanthomas, nares are without discharge. Neck: Negative for carotid bruits. Could not assess JVP 2/2 patient's position in bed (head turned sideways and somnolent on BiPAP). Lungs: Decreased and coarse breath sounds bilaterally. On BiPAP. Heart: Irregularly-irregular, tachycardic, S1 S2 without murmurs, rubs, or gallops.  Abdomen: Soft, non-tender, non-distended with normoactive bowel sounds. No rebound/guarding. Extremities: No clubbing or cyanosis. 2+ pitting edema to the bilateral thighs.  Neuro: Somnolent. Psych:  Somnolent.   Intake/Output Summary (Last 24 hours) at 09/22/15 0858 Last data filed at 09/22/15 0500  Gross per 24 hour  Intake    108 ml  Output    175 ml  Net    -67 ml    Inpatient Medications:  . amiodarone  200 mg Oral Daily  . antiseptic oral rinse  7 mL Mouth Rinse q12n4p  .  ceFAZolin (ANCEF) IV  500 mg Intravenous Q24H  . chlorhexidine  15 mL Mouth Rinse BID  . cholecalciferol  2,000 Units Oral Daily  . donepezil  10 mg Oral Daily  . feeding supplement (ENSURE ENLIVE)  237 mL Oral TID  . memantine  5 mg Oral Daily  . mirtazapine  15 mg Oral QHS  .  multivitamin with minerals  1 tablet Oral Daily  . pantoprazole  40 mg Oral Daily  . senna  1 tablet Oral QHS  . sertraline  100 mg Oral Daily  . sodium chloride  3 mL Intravenous Q12H   Infusions:  . sodium chloride      Labs:  Recent Labs  09/21/15 0614 09/22/15 0433  NA 136 137  K 4.2 4.2  CL 94* 93*  CO2 32 31  GLUCOSE 79 67  BUN 68* 79*  CREATININE 4.25* 5.07*  CALCIUM 8.4* 8.5*  PHOS  --  5.9*    Recent Labs  09/19/15 1936 09/22/15 0433  AST 60*  --   ALT 31  --   ALKPHOS 117  --   BILITOT 0.5  --   PROT 5.7*  --   ALBUMIN 2.1* 1.8*    Recent Labs  09/21/15 0614 09/22/15 0433  WBC 9.8 8.5  HGB 13.6 13.9  HCT 41.5 41.4  MCV 94.1 92.7  PLT 244 251    Recent Labs  09/20/15 0856  CKTOTAL 29*   Invalid input(s): POCBNP No results for input(s): HGBA1C in the last 72 hours.   Weights: Filed Weights   09/20/15 0500 09/21/15 0500 09/22/15 0500  Weight: 149 lb 14.6 oz (68 kg) 161 lb 6 oz (73.2 kg) 164 lb 14.5 oz (74.8 kg)     Radiology/Studies:  Dg Chest 1 View  09/18/2015  CLINICAL DATA:  Shortness of breath . EXAM: CHEST 1  VIEW COMPARISON:  09/17/2015. FINDINGS: Cardiac pacer noted with lead tips in right atrium and right ventricle. Cardiomegaly with pulmonary vascular prominence and bilateral pulmonary interstitial prominence bilateral effusions consistent congestive heart failure. IMPRESSION: 1. Cardiac pacer in stable position. 2. Congestive heart failure with bilateral pulmonary edema and bilateral pleural effusions. No change from prior exam. Electronically Signed   By: Maisie Fus  Register   On: 09/18/2015 09:08   Nm Pulmonary Perf And Vent  09/18/2015  CLINICAL DATA:  82 year old with tachycardia and acute CHF. Patient underwent cardioversion for atrial fibrillation earlier today. Personal history of pulmonary embolism and DVT. EXAM: NUCLEAR MEDICINE VENTILATION - PERFUSION LUNG SCAN TECHNIQUE: Ventilation images were obtained in multiple  projections using inhaled aerosol Tc-62m DTPA. Perfusion images were obtained in multiple projections after intravenous injection of Tc-57m MAA. RADIOPHARMACEUTICALS:  32.4 Technetium-72m DTPA aerosol inhalation and 4.3 Technetium-9m MAA IV COMPARISON:  No prior nuclear imaging. Portable chest x-ray performed earlier same date is correlated. FINDINGS: Ventilation: Deposition of aerosol in the central bronchi. Patchy ventilation in both lungs, with diminished ventilation in the left lung relative to the right. Perfusion: Triple matched large segmental perfusion defects involving the lower lung zones bilaterally, correlating with the large bilateral pleural effusions, left greater than right, noted on the chest x-ray same day. IMPRESSION: Intermediate probability of pulmonary embolism based on PIOPED II criteria (triple matched large segmental perfusion defects involving the lower lung zones bilaterally). These defects correspond to the large bilateral pleural effusions, left greater than right on the chest x-ray earlier today. Electronically Signed   By: Hulan Saas M.D.   On: 09/18/2015 14:52   US Venous Img Lower Bilateral  09/16/2015  CLINICAL DATA:  82 year old female with bilateral lower extremity swelling and edema as well as shortness of breath EXAM: BILATERAL LOWER EXTREMITY VENOUS DOPPLER ULTRASOUND TECHNIQUE: Gray-scale sonography with graded compression, as well as color Doppler and duplex ultrasound were performed to evaluate the lower extremity deep venous systems from the level of the common femoral vein and including the common femoral, femoral, profunda femoral, popliteal and calf veins including the posterior tibial, peroneal and gastrocnemius veins when visible. The superficial great saphenous vein was also interrogated. Spectral Doppler was utilized to evaluate flow at rest and with distal augmentation maneuvers in the common femoral, femoral and popliteal veins. COMPARISON:  None.  FINDINGS: RIGHT LOWER EXTREMITY Common Femoral Vein: No evidence of thrombus. Normal compressibility, respiratory phasicity and response to augmentation. Saphenofemoral Junction: No evidence of thrombus. Normal compressibility and flow on color Doppler imaging. Profunda Femoral Vein: No evidence of thrombus. Normal compressibility and flow on color Doppler imaging. Femoral Vein: No evidence of thrombus. Normal compressibility, respiratory phasicity and response to augmentation. Popliteal Vein: No evidence of thrombus. Normal compressibility, respiratory phasicity and response to augmentation. Calf Veins: No evidence of thrombus. Normal compressibility and flow on color Doppler imaging. Superficial Great Saphenous Vein: No evidence of thrombus. Normal compressibility and flow on color Doppler imaging. Venous Reflux:  None. Other Findings:  None. LEFT LOWER EXTREMITY Common Femoral Vein: No evidence of thrombus. Normal compressibility, respiratory phasicity and response to augmentation. Saphenofemoral Junction: No evidence of thrombus. Normal compressibility and flow on color Doppler imaging. Profunda Femoral Vein: No evidence of thrombus. Normal compressibility and flow on color Doppler imaging. Femoral Vein: No evidence of thrombus. Normal compressibility, respiratory phasicity and response to augmentation. Popliteal Vein: No evidence of thrombus. Normal compressibility, respiratory phasicity and response to augmentation. Calf Veins: No evidence of thrombus. Normal compressibility and flow  on color Doppler imaging. Superficial Great Saphenous Vein: No evidence of thrombus. Normal compressibility and flow on color Doppler imaging. Venous Reflux:  None. Other Findings:  None. IMPRESSION: No evidence of deep venous thrombosis. Electronically Signed   By: Malachy Moan M.D.   On: 09/16/2015 12:28   US Venous Img Upper Uni Right  09/20/2015  CLINICAL DATA:  Right upper extremity edema. EXAM: RIGHT UPPER EXTREMITY  VENOUS DOPPLER ULTRASOUND TECHNIQUE: Gray-scale sonography with graded compression, as well as color Doppler and duplex ultrasound were performed to evaluate the upper extremity deep venous system from the level of the subclavian vein and including the jugular, axillary, basilic, radial, ulnar and upper cephalic vein. Spectral Doppler was utilized to evaluate flow at rest and with distal augmentation maneuvers. COMPARISON:  None. FINDINGS: Contralateral Subclavian Vein: Respiratory phasicity is normal and symmetric with the symptomatic side. No evidence of thrombus. Normal compressibility. Internal Jugular Vein: No evidence of thrombus. Normal compressibility, respiratory phasicity and response to augmentation. Subclavian Vein: No evidence of thrombus. Normal compressibility, respiratory phasicity and response to augmentation. Axillary Vein: No evidence of thrombus. Normal compressibility, respiratory phasicity and response to augmentation. Cephalic Vein: No evidence of thrombus. Normal compressibility, respiratory phasicity and response to augmentation. Basilic Vein: No evidence of thrombus. Normal compressibility, respiratory phasicity and response to augmentation. Brachial Veins: No evidence of thrombus. Normal compressibility, respiratory phasicity and response to augmentation. Radial Veins: No evidence of thrombus. Normal compressibility, respiratory phasicity and response to augmentation. Ulnar Veins: No evidence of thrombus. Normal compressibility, respiratory phasicity and response to augmentation. Venous Reflux:  None visualized. Other Findings: No evidence of superficial thrombophlebitis or abnormal fluid collection. IMPRESSION: No evidence of right upper extremity deep venous thrombosis. Electronically Signed   By: Irish Lack M.D.   On: 09/20/2015 16:31   Dg Chest Port 1 View  09/21/2015  CLINICAL DATA:  Pleural effusion, interval change EXAM: PORTABLE CHEST 1 VIEW COMPARISON:  09/20/2015  FINDINGS: Left-sided transvenous pacemaker leads to the right atrium and right ventricle. The heart is enlarged. There are bilateral pleural effusions. Bibasilar opacities obscure the hemidiaphragms bilaterally and appear stable. IMPRESSION: 1. Cardiomegaly and bibasilar opacities. 2. Stable appearance. Electronically Signed   By: Norva Pavlov M.D.   On: 09/21/2015 15:14   Dg Chest Port 1 View  09/20/2015  CLINICAL DATA:  Acute respiratory failure, shortness of Breath EXAM: PORTABLE CHEST 1 VIEW COMPARISON:  1/23/ 17 FINDINGS: Cardiomegaly again noted. Persistent mild congestion/ edema. Bilateral small pleural effusion with bilateral basilar atelectasis or infiltrate. Slight increase in right pleural effusion. Dual lead cardiac pacemaker is unchanged in position. IMPRESSION: Persistent mild congestion/ edema. Bilateral small pleural effusion with bilateral basilar atelectasis or infiltrate. Slight increased right pleural effusion. Dual lead cardiac pacemaker is unchanged in position. Electronically Signed   By: Natasha Mead M.D.   On: 09/20/2015 08:54   Dg Chest Port 1 View  09/17/2015  CLINICAL DATA:  Hypoxia EXAM: PORTABLE CHEST 1 VIEW COMPARISON:  09/15/2015 FINDINGS: Cardiomegaly again noted. Dual lead cardiac pacemaker is unchanged position. Central mild vascular congestion without convincing pulmonary edema. Small to moderate bilateral pleural effusion. Slight increase in right effusion. Bilateral basilar atelectasis or infiltrate. IMPRESSION: Central mild vascular congestion without convincing pulmonary edema. Small to moderate bilateral pleural effusion. Slight increase in right effusion. Bilateral basilar atelectasis or infiltrate. Electronically Signed   By: Natasha Mead M.D.   On: 09/17/2015 09:04   Dg Chest Port 1 View  09/15/2015  CLINICAL DATA:  CHF EXAM:  PORTABLE CHEST 1 VIEW COMPARISON:  09/11/2011 FINDINGS: Left pacer remains in place, unchanged. Cardiomegaly. Vascular congestion. Small  bilateral pleural effusions with bibasilar atelectasis or infiltrates. No acute bony abnormality. IMPRESSION: Cardiomegaly with vascular congestion. Small bilateral pleural effusions with bibasilar atelectasis or infiltrates. Electronically Signed   By: Charlett Nose M.D.   On: 09/15/2015 12:07     Assessment and Plan   1. Paroxysmal Atrial fibrillation with RVR - underwent TEE/DCCV on 09/15/2015 with return to NSR for approximately one hour but unfortunately went back into atrial fibrillation with RVR  - Repeat DCCV on 09/18/2015, was on Amiodarone drip. Converted back into atrial fibrillation around 2200 on 09/20/2015. Now on PO Amiodarone  daily. HR now elevated in the 130's - Metoprolol held for hypotension -Swap PO amiodarone to amiodarone infusion possible rate control, though this may be difficult given her significant volume overload (amiodarone infusion to be given without bolus in an effort to minimize hypotension)  -Cannot use BB or CCB given her soft BP or digoxin given her renal failure -This is a difficult situation and likely to have a poor outcome. It is unclear if the family has a true grasp of the situation at this time - CHA2DS2-VASc Score and unadjusted Ischemic Stroke Rate (% per year) is equal to 7.2 % stroke rate/year from a score of 5 (CHF, HTN, Age (2), Female). Was switched to Heparin in anticipation of thoracentesis. Foley bag on 09/20/2015 demonstrated Hematuria, somewhat improved today. Will hold Heparin at this time.  2. Acute on Chronic Diastolic CHF  - Creatinine became elevated on lasix and UTI, now held - still with lower extremity edema and persistent bilateral pleural effusions on repeat CXR 1/26 - may need thoracentesis for worsening dyspnea and bilateral pleural effusions. Patient is not wishing to have further procedures at this time.  3. Tachy-brady Syndrome - s/p PPM placement.  4. Acute renal failure on stage 3 CKD - Creatinine continues to trend  upward. 1.89 on 09/18/2015, 5.07 on 09/22/2015.  - Nephrology following. The patient does not wish to pursue HD and voiced this to her daughter several times.  5. Bilateral Pleural effusions - Moderate in size on x-ray and VQ scan. Contributing to her shortness of breath symptoms.  6. Acute Respiratory Failure - hypoxic, lethargic, and with altered mental status on 09/19/2015. Was placed on BiPAP was transitioned to high-flow nasal cannula earlier today.  - remains on BiPAP currently.  7. Multi-organ failure - patient with acute on chronic kidney failure and elevated liver function tests from 1/24, will recheck LFTs today to trend  - agree with palliative care consult and likely hospice care moving forward if she and the family do not want invasive care - poor prognosis   Signed, Eula Listen, PA-C Pager: [phone] 09/22/2015, 8:58 AM

## 2015-09-22 NOTE — Progress Notes (Signed)
New referral for hospice home on 82yo female who was admitted to [facility] on 1.20.17 with CHF, hypoxia and atrial fib.  Patient underwent elective cardioversion on 1.23.15 with conversion back to NSR.  Patient has not responded to aggressive treatment in the hospital with increased dyspenia and respiratory acidosis and was placed on bipap and transferred to the CCU.  Patient is not responsive today and family has elected to switch to full comfort measures.  Patient remains on bipap with plans to wean off today.  I spoke with the patient's son and daughter regarding hospice home services and they would like to take the patient home with hospice.  Discussed this with the team and they feel at this time, patient is too unstable for transport.  Discussed this further with the family and they are in agreement with moving patient to 1C on comfort care and if patient stabilizes, then we will plan discharge home tomorrow with hospice services.  Patient will need oxygen in the home- will need delivered prior to discharge.  Triage will need to follow up Saturday morning with hospital to check on patient's status.  Best contact is Kerri Perches- daughter [phone], with alternative contact- Cristy Colmenares- patient's son [phone].  All information faxed to referral intake.  Portable DNR to accompany patient at discharge if able to discharge.  Thank you for allowing participation in this patient's care.  Dolores Hoose, RN [facility]

## 2015-09-23 MED ORDER — GLYCOPYRROLATE 1 MG PO TABS
1.0000 mg | ORAL_TABLET | Freq: Three times a day (TID) | ORAL | Status: DC | PRN
  Filled 2015-09-23: qty 1

## 2015-09-23 MED ORDER — PROCHLORPERAZINE 25 MG RE SUPP: 25.0000 mg | Freq: Three times a day (TID) | RECTAL | Status: DC | PRN

## 2015-09-23 MED ORDER — GLYCOPYRROLATE 1 MG PO TABS: 1.0000 mg | ORAL_TABLET | Freq: Three times a day (TID) | ORAL | Status: AC | PRN

## 2015-09-23 MED ORDER — BISACODYL 10 MG RE SUPP: 10.0000 mg | Freq: Every day | RECTAL | Status: AC | PRN

## 2015-09-23 MED ORDER — PROCHLORPERAZINE 25 MG RE SUPP: 25.0000 mg | Freq: Three times a day (TID) | RECTAL | Status: AC | PRN

## 2015-09-23 MED ORDER — BISACODYL 10 MG RE SUPP: 10.0000 mg | Freq: Every day | RECTAL | Status: DC | PRN

## 2015-09-23 NOTE — Progress Notes (Signed)
[facility] - [place] [facility]   PATIENT NAME: [name] [name]    MR#:  [id number]  DATE OF BIRTH:  01/14/1923  SUBJECTIVE: I feel good  CHIEF COMPLAINT:  No chief complaint on file.  patient is a 82 year old Caucasian female with past medical history significant for history of atrial fibrillation for which she underwent elective cardioversion with Dr. Kirke Corin. She remained in sinus rhythm for about one hour and then converted back into atrial fibrillation, became short of breath and presented  to the hospital hypoxic and in CHF. She was noted to have poor urinary output, patient's family decided to  initiate comfort care measures,  Patient is on comfort care measures. According to family's report patient had a good breakfast today. No overnight episodes of shortness of breath     Review of Systems  Unable to perform ROS: acuity of condition    VITAL SIGNS: Blood pressure 120/65, pulse 102, temperature 98.9 F (37.2 C), temperature source Oral, resp. rate 21, height  (1.651 m), weight 69.763 kg (153 lb 12.8 oz), SpO2 95 %.  PHYSICAL EXAMINATION:   GENERAL:  82 y.o.-year-old patient lying in the bed in moderate respiratory distress. Remains Tachypneic, now on BiPAP and unresponsive  EYES: Pupils equal, round, reactive to light and accommodation. No scleral icterus.  HEENT: Head atraumatic, normocephalic. Oropharynx and nasopharynx clear.  NECK:  Supple, no jugular venous distention. No thyroid enlargement, no tenderness.  LUNGS: Diminished breath sounds bilaterally at bases, no wheezing, bilateral crepitations anteriorly Diminished breath sounds at bases . Using accessory muscles to some extent. Tachypnea  CARDIOVASCULAR: S1, S2 , irregularly irregular.   ORDERS/RESULTS REVIEWED:   CBC  Recent Labs Lab 09/19/15 1936 09/20/15 0905 09/21/15 0614 09/22/15 0433  WBC 8.8 9.7 9.8 8.5  HGB 14.1 13.2 13.6 13.9  HCT 45.2 41.4 41.5 41.4  PLT 232  234 244 251  MCV 98.0 94.3 94.1 92.7  MCH 30.7 30.0 30.8 31.1  MCHC 31.3* 31.8* 32.8 33.5  RDW 17.2* 16.6* 16.4* 15.8*   ------------------------------------------------------------------------------------------------------------------  Chemistries   Recent Labs Lab 09/19/15 0449 09/19/15 1936 09/20/15 1239 09/21/15 0614 09/22/15 0433  NA 137 138 139 136 136  137  K 3.9 4.5 4.2 4.2 4.2  4.2  CL 95* 95* 93* 94* 94*  93*  CO2 35* 35* 37* 32 28  31  GLUCOSE 138* 117* 98 79 63*  67  BUN 49* 53* 62* 68* 78*  79*  CREATININE 2.53* 3.00* 3.66* 4.25* 5.00*  5.07*  CALCIUM 9.0 8.9 9.1 8.4* 8.3*  8.5*  MG 1.9  --   --   --   --   AST  --  60*  --   --  47*  ALT  --  31  --   --  23  ALKPHOS  --  117  --   --  125  BILITOT  --  0.5  --   --  1.0   ------------------------------------------------------------------------------------------------------------------ estimated creatinine clearance is 7.2 mL/min (by C-G formula based on Cr of 5.07). ------------------------------------------------------------------------------------------------------------------ No results for input(s): TSH, T4TOTAL, T3FREE, THYROIDAB in the last 72 hours.  Invalid input(s): FREET3  Cardiac Enzymes No results for input(s): CKMB, TROPONINI, MYOGLOBIN in the last 168 hours.  Invalid input(s): CK ------------------------------------------------------------------------------------------------------------------ Invalid input(s): POCBNP ---------------------------------------------------------------------------------------------------------------  RADIOLOGY: No results found.  EKG:  Orders placed or performed during the hospital encounter of 09/15/15  . EKG 12-Lead pre-cardioversion  . EKG 12-Lead pre-cardioversion  . EKG 12-Lead  .  EKG 12-Lead  . EKG 12-Lead  . EKG 12-Lead  . EKG 12-Lead  . EKG 12-Lead    ASSESSMENT AND PLAN:  Principal Problem:   Atrial fibrillation with RVR  (HCC) Active Problems:   Acute on chronic diastolic (congestive) heart failure (HCC)   Hypoxia   Encephalopathy acute   Lethargic   Acute respiratory failure with hypoxia and hypercarbia (HCC)   Swelling   Tachycardia   Pleural effusion  #1 atrial fibrillation, RVR  status post TEE and elective cardioversion 2,   #2. Acute on chronic diastolic CHF due to atrial fibrillation, of Lasix due to acute on chronic renal failure.     #3. Lower extremity edema, Doppler ultrasound revealed no evidence of DVT  #4. Acute on chronic renal failure on underlying chronic stage III renal insufficiency, patient is off diuretics now.  hemodialysis is refused  , on comfort care measures   #5. Dementia with behavioral problems. Supportive therapy  #6 generalized weakness, palliative carinvolved and patient's family decided on comfort care measures , hospice is consulted , possible transfer to hospice home tomorrow if patient remains stable  And bed is available  #7.  urinary tract infection , Urinary cultures revealed 80,000 colony-forming units of Proteus mirabilis,  supportive therapy only   #8. Acute respiratory failure with hypoxia  and hypercapnia due to acute on chronic diastolic CHF, . Continue oxygen through nasal cannulas    #9 hematuria, off heparin   Management plans discussed with the patient, family and they are in agreement. Will transfer the patient to hospice home if bed is available and if patient is medically stable.   DRUG ALLERGIES:  Allergies  Allergen Reactions  . Ace Inhibitors     REACTION: cough  . Alendronate Sodium     REACTION: aches  . Avapro [Irbesartan]     hypotension  . Codeine     Dizzy and nauseated   . Dabigatran Etexilate Mesylate Diarrhea    indigestion  . Nitrofurantoin     REACTION: rash  . Penicillins     REACTION: rash  . Prednisone Other (See Comments)    Extreme weakness  . Trazodone And Nefazodone Other (See Comments)    Nightmares      CODE STATUS:     Code Status Orders        Start     Ordered   09/15/15 1415  Do not attempt resuscitation (DNR)   Continuous    Question Answer Comment  In the event of cardiac or respiratory ARREST Do not call a "code blue"   In the event of cardiac or respiratory ARREST Do not perform Intubation, CPR, defibrillation or ACLS   In the event of cardiac or respiratory ARREST Use medication by any route, position, wound care, and other measures to relive pain and suffering. May use oxygen, suction and manual treatment of airway obstruction as needed for comfort.      09/15/15 1414    Code Status History    Date Active Date Inactive Code Status Order ID Comments User Context   10/11/2014  1:12 PM 10/12/2014 11:05 PM Full Code [id number]  Sheral Apley, MD Inpatient   06/23/2013  4:37 PM 06/26/2013  7:15 PM Full Code [id number]  Esperanza Sheets, MD Inpatient    Advance Directive Documentation        Most Recent Value   Type of Advance Directive  Healthcare Power of Attorney, Living will   Pre-existing out of facility DNR order (  yellow form or pink MOST form)     "MOST" Form in Place?        TOTAL TIME TAKING CARE OF THIS patient 35 minutes   Discussed with patient's family, emotional support provided   Ramonita Lab M.D on 09/23/2015 at 3:57 PM  Between 7am to 6pm - Pager - [phone]  After 6pm go to [email or web address] - password [email or web address] [facility]  [facility]  Office  [phone]  CC: Primary care physician; Roxy Manns, MD

## 2015-09-23 NOTE — Progress Notes (Signed)
Call from [facility] to inform CSW, they have no beds at this time.  [facility] will call when a bed is available.  [name]. LCSWA Clinical Social Work Department [phone] 11:27 AM

## 2015-09-23 NOTE — Plan of Care (Signed)
Problem: Health Behavior/Discharge Planning: Goal: Ability to manage health-related needs will improve for discharge Outcome: Progressing Pt remains comfort care, family at bedside, generalized edema, PRN morphine for pain with effectiveness, turned and repositioned for comfort, pt ate and drank very well this morning for breakfast

## 2015-09-24 NOTE — Progress Notes (Signed)
Report called to [facility]. Bo Mcclintock, RN

## 2015-09-24 NOTE — Progress Notes (Signed)
EMS called for transport. Honey Zakarian S, RN  

## 2015-09-24 NOTE — Discharge Summary (Signed)
[facility] [place] at [facility]   PATIENT NAME: [name] [name]    MR#:  [id number]  DATE OF BIRTH:  1923-04-02  DATE OF ADMISSION:  09/15/2015 ADMITTING PHYSICIAN: Enedina Finner, MD  DATE OF DISCHARGE: 09/24/2015 PRIMARY CARE PHYSICIAN: Roxy Manns, MD    ADMISSION DIAGNOSIS:  Cardioversion and TEE        AFIB No anesthesia needed for TEE for scheduling purposes only atrial fib  DISCHARGE DIAGNOSIS:  Principal Problem:   Atrial fibrillation with RVR (HCC) Active Problems:   Acute on chronic diastolic (congestive) heart failure (HCC)   Hypoxia   Encephalopathy acute   Lethargic   Acute respiratory failure with hypoxia and hypercarbia (HCC)   Swelling   Tachycardia   Pleural effusion  acute on chronic renal failure stage III  SECONDARY DIAGNOSIS:   Past Medical History  Diagnosis Date  . Allergic rhinitis   . Anxiety   . Depression   . GERD (gastroesophageal reflux disease)   . Hypertension   . Osteopenia   . Bradycardia     a. s/p PPM  . Hyperlipidemia   . Duodenal ulcer   . Diverticulosis   . GI bleed   . PAF (paroxysmal atrial fibrillation) (HCC)     a. s/p TEE/DCCV 09/15/2015; On Eliquis b. repeat DCCV 09/18/2015  . Anemia associated with acute blood loss   . Diverticulosis   . Chronic diastolic CHF (congestive heart failure) (HCC)     a. 08/2015: EF 50-55%    HOSPITAL COURSE:   patient is a 82 year old Caucasian female with past medical history significant for history of atrial fibrillation for which she underwent elective cardioversion with Dr. Kirke Corin. She remained in sinus rhythm for about one hour and then converted back into atrial fibrillation, became short of breath and presented to the hospital hypoxic and in CHF. She was noted to have poor urinary output, patient's family decided to initiate comfort care measures,   #1 atrial fibrillation, RVR status post TEE and elective cardioversion 2, currently patient is  comfort care  #2. Acute on chronic diastolic CHF due to atrial fibrillation, off Lasix due to acute on chronic renal failure.   #3. Lower extremity edema, Doppler ultrasound revealed no evidence of DVT  #4. Acute on chronic renal failure on underlying chronic stage III renal insufficiency, patient is off diuretics now. hemodialysis is refused , on comfort care measures   #5. Dementia with behavioral problems. Supportive therapy  #6 generalized weakness, palliative carinvolved and patient's family decided on comfort care measures , hospice is consulted , possible transfer to hospice home tomorrow if patient remains stable And bed is available   #7. urinary tract infection , Urinary cultures revealed 80,000 colony-forming units of Proteus mirabilis, supportive therapy only   #8. Acute respiratory failure with hypoxia and hypercapnia due to acute on chronic diastolic CHF, . Continue oxygen through nasal cannulas   #9 hematuria, off heparin   Management plans discussed with the patient, family and they are in agreement. Will transfer the patient to hospice home if bed is available and if patient is medically stable.  DISCHARGE CONDITIONS:   Guarded  CONSULTS OBTAINED:  Treatment Team:  Iran Ouch, MD Lamont Dowdy, MD   PROCEDURES cardioversion  DRUG ALLERGIES:   Allergies  Allergen Reactions  . Ace Inhibitors     REACTION: cough  . Alendronate Sodium     REACTION: aches  . Avapro [Irbesartan]     hypotension  . Codeine  Dizzy and nauseated   . Dabigatran Etexilate Mesylate Diarrhea    indigestion  . Nitrofurantoin     REACTION: rash  . Penicillins     REACTION: rash  . Prednisone Other (See Comments)    Extreme weakness  . Trazodone And Nefazodone Other (See Comments)    Nightmares     DISCHARGE MEDICATIONS:   Current Discharge Medication List    START taking these medications   Details  bisacodyl (DULCOLAX) 10 MG suppository Place 1  suppository (10 mg total) rectally daily as needed for moderate constipation. Qty: 12 suppository, Refills: 0    glycopyrrolate (ROBINUL) 1 MG tablet Take 1 tablet (1 mg total) by mouth 3 (three) times daily as needed (secretions).    LORazepam (ATIVAN) 2 MG/ML concentrated solution Take 0.5 mLs (1 mg total) by mouth every 6 (six) hours as needed for anxiety. Qty: 30 mL, Refills: 0    Morphine Sulfate (MORPHINE CONCENTRATE) 10 MG/0.5ML SOLN concentrated solution Take 0.5 mLs (10 mg total) by mouth every 3 (three) hours as needed for moderate pain or severe pain. Qty: 30 mL, Refills: 0    prochlorperazine (COMPAZINE) 25 MG suppository Place 1 suppository (25 mg total) rectally every 8 (eight) hours as needed for nausea or vomiting. Qty: 12 suppository, Refills: 0      STOP taking these medications     amiodarone (PACERONE) 400 MG tablet      apixaban (ELIQUIS) 2.5 MG TABS tablet      Cholecalciferol (VITAMIN D3) 2000 UNITS TABS      [employer] SENNA 8.6 MG tablet      donepezil (ARICEPT) 10 MG tablet      furosemide (LASIX) 40 MG tablet      memantine (NAMENDA) 5 MG tablet      metoprolol tartrate (LOPRESSOR) 25 MG tablet      mirtazapine (REMERON) 15 MG tablet      Multiple Vitamin (MULTIVITAMIN) tablet      omeprazole (PRILOSEC) 20 MG capsule      sertraline (ZOLOFT) 100 MG tablet      SUPER B COMPLEX/C PO      doxycycline (VIBRA-TABS) 100 MG tablet          DISCHARGE INSTRUCTIONS:   Diet as tolerated Activity-bedrest with bathroom privileges, continue 5 L of oxygen via nasal cannula Continue comfort care measures   DIET:  Regular - as tolerated   DISCHARGE CONDITION:  gaurded   ACTIVITY:  Activity-bedrest with bathroom privileges, continue 5 L of oxygen via nasal cannula  OXYGEN:  Home Oxygen: Yes.     Oxygen Delivery: 5 liters/min via Patient connected to nasal cannula oxygen  DISCHARGE LOCATION:  Hospice home   If you experience worsening of your  admission symptoms, develop shortness of breath, life threatening emergency, suicidal or homicidal thoughts you must seek medical attention immediately by calling 911 or calling your MD immediately  if symptoms less severe.  You Must read complete instructions/literature along with all the possible adverse reactions/side effects for all the Medicines you take and that have been prescribed to you. Take any new Medicines after you have completely understood and accpet all the possible adverse reactions/side effects.   Please note  You were cared for by a hospitalist during your hospital stay. If you have any questions about your discharge medications or the care you received while you were in the hospital after you are discharged, you can call the unit and asked to speak with the hospitalist on call if  the hospitalist that took care of you is not available. Once you are discharged, your primary care physician will handle any further medical issues. Please note that NO REFILLS for any discharge medications will be authorized once you are discharged, as it is imperative that you return to your primary care physician (or establish a relationship with a primary care physician if you do not have one) for your aftercare needs so that they can reassess your need for medications and monitor your lab values.     Today  No chief complaint on file.  Patient is on comfort care measures. According to family's report patient had a good breakfast today. No overnight incidents    ROS:   RESPIRATORY: shortness of breath with minimum exertion CARDIOVASCULAR: Denies chest pain, palpitations, edema.    VITAL SIGNS:  Blood pressure 108/71, pulse 102, temperature 98.2 F (36.8 C), temperature source Oral, resp. rate 20, height  (1.651 m), weight 69.763 kg (153 lb 12.8 oz), SpO2 98 %.  I/O:    Intake/Output Summary (Last 24 hours) at 09/24/15 1116 Last data filed at 09/24/15 1038  Gross per 24 hour   Intake    360 ml  Output    175 ml  Net    185 ml    PHYSICAL EXAMINATION:  GENERAL:  82 y.o.-year-old patient lying in the bed with no acute distress.  EYES: Pupils equal, round, reactive to light and accommodation. No scleral icterus. HEENT:  Oropharynx and nasopharynx clear.  NECK:  Supple, no jugular venous distention. No thyroid enlargement, no tenderness.  LUNGS: Coarse breath sounds bilaterally, positive rales,rhonchi or crepitation. No use of accessory muscles of respiration. Tachypneic CARDIOVASCULAR: Irregularly irregular  PSYCHIATRIC: The patient is alert and oriented x 3.    DATA REVIEW:   CBC  Recent Labs Lab 09/22/15 0433  WBC 8.5  HGB 13.9  HCT 41.4  PLT 251    Chemistries   Recent Labs Lab 09/19/15 0449  09/22/15 0433  NA 137  < > 136  137  K 3.9  < > 4.2  4.2  CL 95*  < > 94*  93*  CO2 35*  < > 28  31  GLUCOSE 138*  < > 63*  67  BUN 49*  < > 78*  79*  CREATININE 2.53*  < > 5.00*  5.07*  CALCIUM 9.0  < > 8.3*  8.5*  MG 1.9  --   --   AST  --   < > 47*  ALT  --   < > 23  ALKPHOS  --   < > 125  BILITOT  --   < > 1.0  < > = values in this interval not displayed.  Cardiac Enzymes No results for input(s): TROPONINI in the last 168 hours.  Microbiology Results  Results for orders placed or performed during the hospital encounter of 09/15/15  Urine culture     Status: None   Collection Time: 09/17/15  4:17 PM  Result Value Ref Range Status   Specimen Description URINE, CLEAN CATCH  Final   Special Requests Normal  Final   Culture 80,000 COLONIES/ml PROTEUS MIRABILIS  Final   Report Status 09/19/2015 FINAL  Final   Organism ID, Bacteria PROTEUS MIRABILIS  Final      Susceptibility   Proteus mirabilis - MIC*    AMPICILLIN <=2 SENSITIVE Sensitive     CEFAZOLIN <=4 SENSITIVE Sensitive     CEFTRIAXONE <=1 SENSITIVE Sensitive  CIPROFLOXACIN <=0.25 SENSITIVE Sensitive     GENTAMICIN <=1 SENSITIVE Sensitive     IMIPENEM <=0.25  SENSITIVE Sensitive     NITROFURANTOIN 128 RESISTANT Resistant     TRIMETH/SULFA <=20 SENSITIVE Sensitive     CEFTAZIDIME Value in next row Sensitive      SENSITIVE<=1    PIP/TAZO Value in next row Sensitive      SENSITIVE<=4    AMPICILLIN/SULBACTAM Value in next row Sensitive      SENSITIVE<=2    * 80,000 COLONIES/ml PROTEUS MIRABILIS  MRSA PCR Screening     Status: None   Collection Time: 09/21/15  4:18 PM  Result Value Ref Range Status   MRSA by PCR NEGATIVE NEGATIVE Final    Comment:        The GeneXpert MRSA Assay (FDA approved for NASAL specimens only), is one component of a comprehensive MRSA colonization surveillance program. It is not intended to diagnose MRSA infection nor to guide or monitor treatment for MRSA infections.     RADIOLOGY:  US Venous Img Upper Uni Right  09/20/2015  CLINICAL DATA:  Right upper extremity edema. EXAM: RIGHT UPPER EXTREMITY VENOUS DOPPLER ULTRASOUND TECHNIQUE: Gray-scale sonography with graded compression, as well as color Doppler and duplex ultrasound were performed to evaluate the upper extremity deep venous system from the level of the subclavian vein and including the jugular, axillary, basilic, radial, ulnar and upper cephalic vein. Spectral Doppler was utilized to evaluate flow at rest and with distal augmentation maneuvers. COMPARISON:  None. FINDINGS: Contralateral Subclavian Vein: Respiratory phasicity is normal and symmetric with the symptomatic side. No evidence of thrombus. Normal compressibility. Internal Jugular Vein: No evidence of thrombus. Normal compressibility, respiratory phasicity and response to augmentation. Subclavian Vein: No evidence of thrombus. Normal compressibility, respiratory phasicity and response to augmentation. Axillary Vein: No evidence of thrombus. Normal compressibility, respiratory phasicity and response to augmentation. Cephalic Vein: No evidence of thrombus. Normal compressibility, respiratory phasicity and  response to augmentation. Basilic Vein: No evidence of thrombus. Normal compressibility, respiratory phasicity and response to augmentation. Brachial Veins: No evidence of thrombus. Normal compressibility, respiratory phasicity and response to augmentation. Radial Veins: No evidence of thrombus. Normal compressibility, respiratory phasicity and response to augmentation. Ulnar Veins: No evidence of thrombus. Normal compressibility, respiratory phasicity and response to augmentation. Venous Reflux:  None visualized. Other Findings: No evidence of superficial thrombophlebitis or abnormal fluid collection. IMPRESSION: No evidence of right upper extremity deep venous thrombosis. Electronically Signed   By: Irish Lack M.D.   On: 09/20/2015 16:31   Dg Chest Port 1 View  09/21/2015  CLINICAL DATA:  Pleural effusion, interval change EXAM: PORTABLE CHEST 1 VIEW COMPARISON:  09/20/2015 FINDINGS: Left-sided transvenous pacemaker leads to the right atrium and right ventricle. The heart is enlarged. There are bilateral pleural effusions. Bibasilar opacities obscure the hemidiaphragms bilaterally and appear stable. IMPRESSION: 1. Cardiomegaly and bibasilar opacities. 2. Stable appearance. Electronically Signed   By: Norva Pavlov M.D.   On: 09/21/2015 15:14    EKG:   Orders placed or performed during the hospital encounter of 09/15/15  . EKG 12-Lead pre-cardioversion  . EKG 12-Lead pre-cardioversion  . EKG 12-Lead  . EKG 12-Lead  . EKG 12-Lead  . EKG 12-Lead  . EKG 12-Lead  . EKG 12-Lead      Management plans discussed with the patient, family and they are in agreement.  CODE STATUS:     Code Status Orders        Start  Ordered   09/15/15 1415  Do not attempt resuscitation (DNR)   Continuous    Question Answer Comment  In the event of cardiac or respiratory ARREST Do not call a "code blue"   In the event of cardiac or respiratory ARREST Do not perform Intubation, CPR, defibrillation or  ACLS   In the event of cardiac or respiratory ARREST Use medication by any route, position, wound care, and other measures to relive pain and suffering. May use oxygen, suction and manual treatment of airway obstruction as needed for comfort.      09/15/15 1414    Code Status History    Date Active Date Inactive Code Status Order ID Comments User Context   10/11/2014  1:12 PM 10/12/2014 11:05 PM Full Code [id number]  Sheral Apley, MD Inpatient   06/23/2013  4:37 PM 06/26/2013  7:15 PM Full Code [id number]  Esperanza Sheets, MD Inpatient    Advance Directive Documentation        Most Recent Value   Type of Advance Directive  Healthcare Power of Attorney, Living will   Pre-existing out of facility DNR order (yellow form or pink MOST form)     "MOST" Form in Place?        TOTAL TIME TAKING CARE OF THIS PATIENT: .    @M[facility] @  on 09/24/2015 at 11:16 AM  Between 7am to 6pm - Pager - [phone]  After 6pm go to [email or web address] - password [email or web address] [facility]  [place] [facility]  Office  [phone]  CC: Primary care physician; Roxy Manns, MD

## 2015-09-24 NOTE — Clinical Social Work Note (Signed)
Pt is ready for discharge today to [facility]. Facility has received discharge summary and ready to accept pt. RN called reported and [facility] provided transportation. Pt's family aware and agreeable to discharge plan. CSW is signing off as no further needs identified.   Dede Query, MSW, LCSW  Clinicial Social Worker  [phone]

## 2015-09-24 NOTE — Discharge Instructions (Signed)
Diet as tolerated Activity-bedrest with bathroom privileges, continue 5 L of oxygen via nasal cannula Continue comfort care measures

## 2015-09-24 NOTE — Progress Notes (Signed)
Patient discharged to Hospice Home via EMS. Daughter at bedside. Marquice Uddin S, RN  

## 2015-09-25 ENCOUNTER — Telehealth: Payer: Self-pay

## 2015-09-25 NOTE — Telephone Encounter (Signed)
Patient was recently discharged from [facility] on 09/24/15 after being admitted on 09/15/15. Per discharge summary, patient has been discharged to hospice. Attempted to contact patient's daughter and follow-up to determine if there were any needs or concerns. Left voicemail message with contact info. No transitional care management needed at this time.

## 2015-09-25 NOTE — Telephone Encounter (Signed)
I faxed all information to [employer] on 09/18/15 and have confirmation that they received that fax.  We are still waiting for their response.

## 2015-09-25 NOTE — Telephone Encounter (Signed)
I did answer the questions asked and forwarded that to [facility] about a week or so ago

## 2015-09-25 NOTE — Telephone Encounter (Signed)
Sim Boast with [employer] [address] Long Term benefits team called for Dr. Scarlett Presto office to verify that the Conference call with Dr. Milinda Antis was still scheduled for tomorrow 09/26/15 at 1:30 pm.  I explained that on 09/15/15 we had faxed additional information to Dyane Dustman with [employer] and she stated that she was going to try and facilitate the completion of this case without the peer to peer review.

## 2015-09-25 NOTE — Telephone Encounter (Signed)
Thanks - please let pt know that

## 2015-09-25 NOTE — Telephone Encounter (Signed)
Aware-thanks  I have followed along in epic

## 2015-09-25 NOTE — Telephone Encounter (Signed)
Daughter [name] [name] confirmed patient has been discharged to [facility] for comfort measures only. **Additionally, daughter made reference to an ongoing need to continue process with [employer] to obtain payment of long-term care.

## 2015-09-25 NOTE — Telephone Encounter (Signed)
Called and informed Dyane Dustman with [employer], she was aware that Dr. Juliann Pulse continues to need additional information and would be completing the peer-to-peer on 09/26/15.  Daughter [name] has been informed as well of the ongoing need for the conference call.

## 2015-09-25 NOTE — Telephone Encounter (Signed)
Opened in error

## 2015-10-10 ENCOUNTER — Encounter: Payer: Commercial Managed Care - HMO | Admitting: Internal Medicine

## 2015-10-17 ENCOUNTER — Encounter: Payer: Commercial Managed Care - HMO | Admitting: Internal Medicine

## 2015-10-25 NOTE — Telephone Encounter (Signed)
[name] [name] has informed them

## 2015-10-25 DEATH — deceased

## 2015-11-24 IMAGING — CR DG PORTABLE PELVIS
1 series · 1 of 1 positions shown · non-contrast
Comparison: 01/10/2014

CLINICAL DATA: Postoperative exam after left total hip arthroplasty

EXAM:
PORTABLE PELVIS 1-2 VIEWS

[AP]
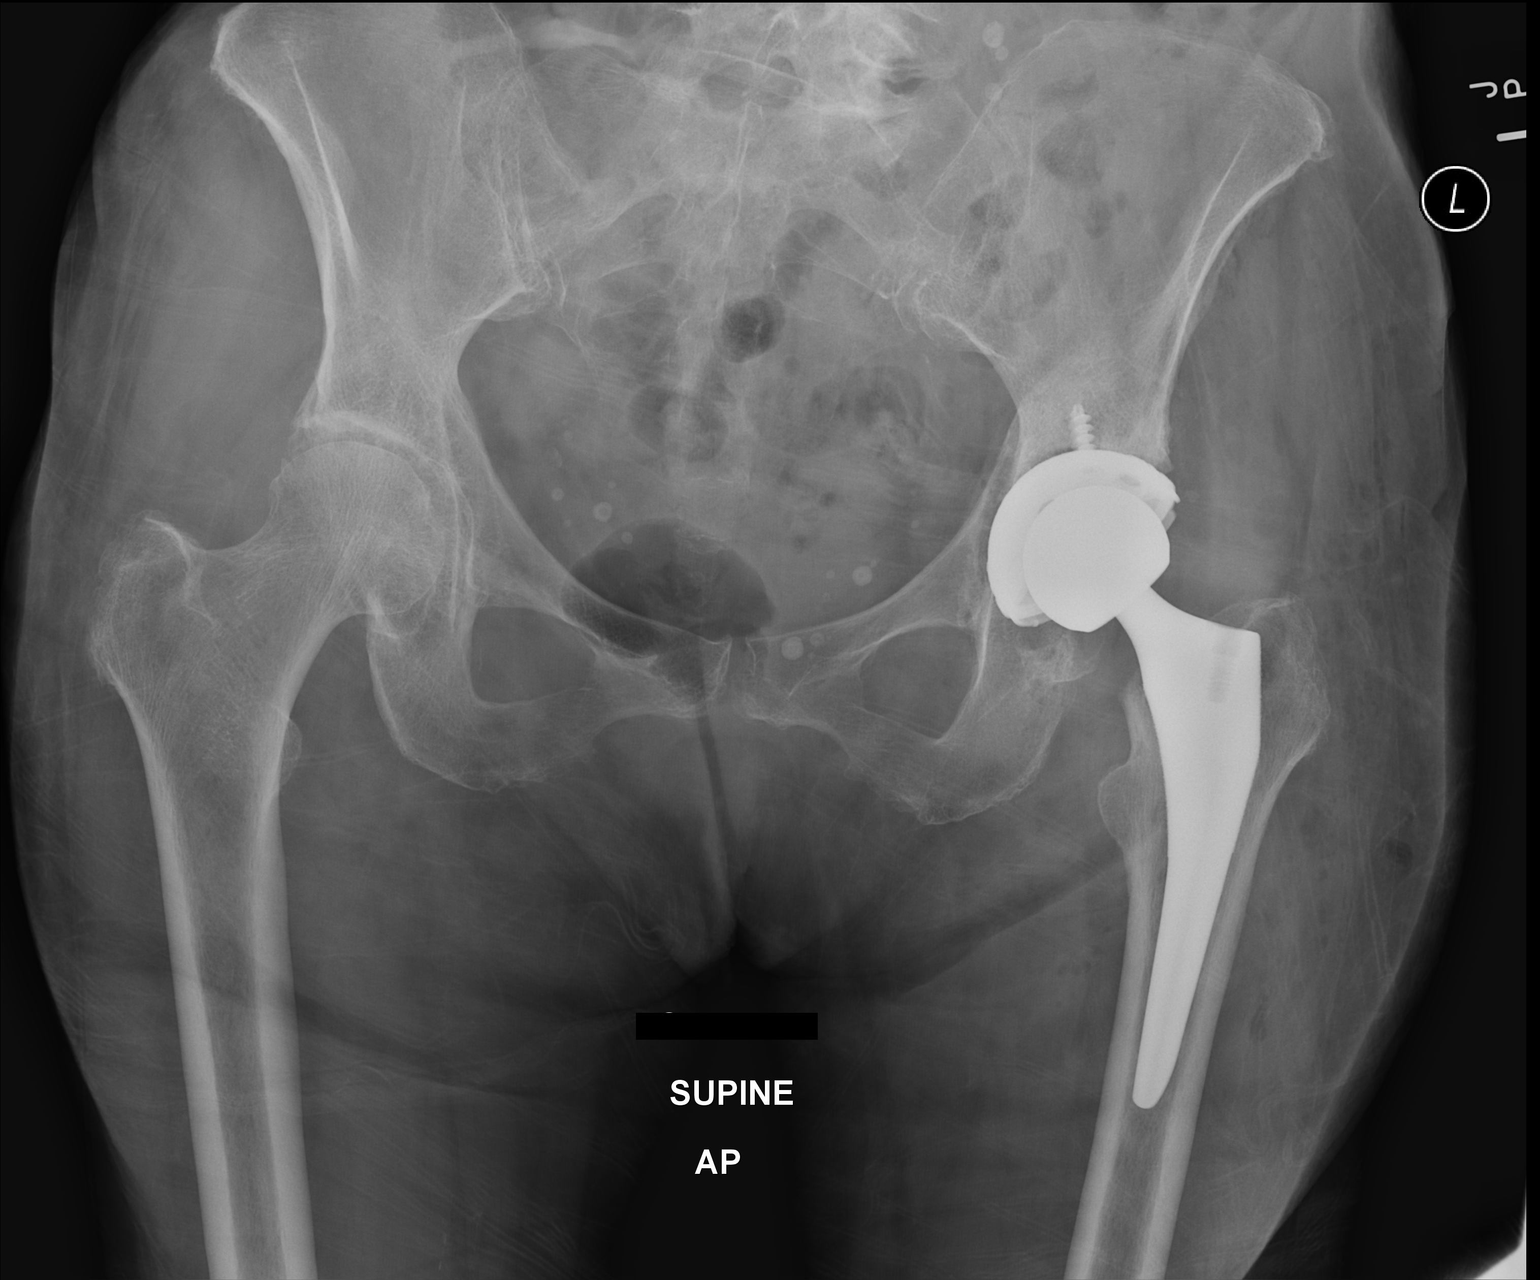

[1 of 1 positions shown; findings below may reference images not displayed]

FINDINGS: Left total hip arthroplasty noted. No evidence for hardware failure.
No fracture line identified. Mild right hip degenerative change
noted. Pelvic phleboliths are noted.
IMPRESSION: Interval placement of left total hip arthroplasty.

## 2016-11-03 IMAGING — CR DG CHEST 1V PORT
1 series · 1 of 1 positions shown · non-contrast
Comparison: 09/20/2015

CLINICAL DATA: Pleural effusion, interval change

EXAM:
PORTABLE CHEST 1 VIEW

[portable]
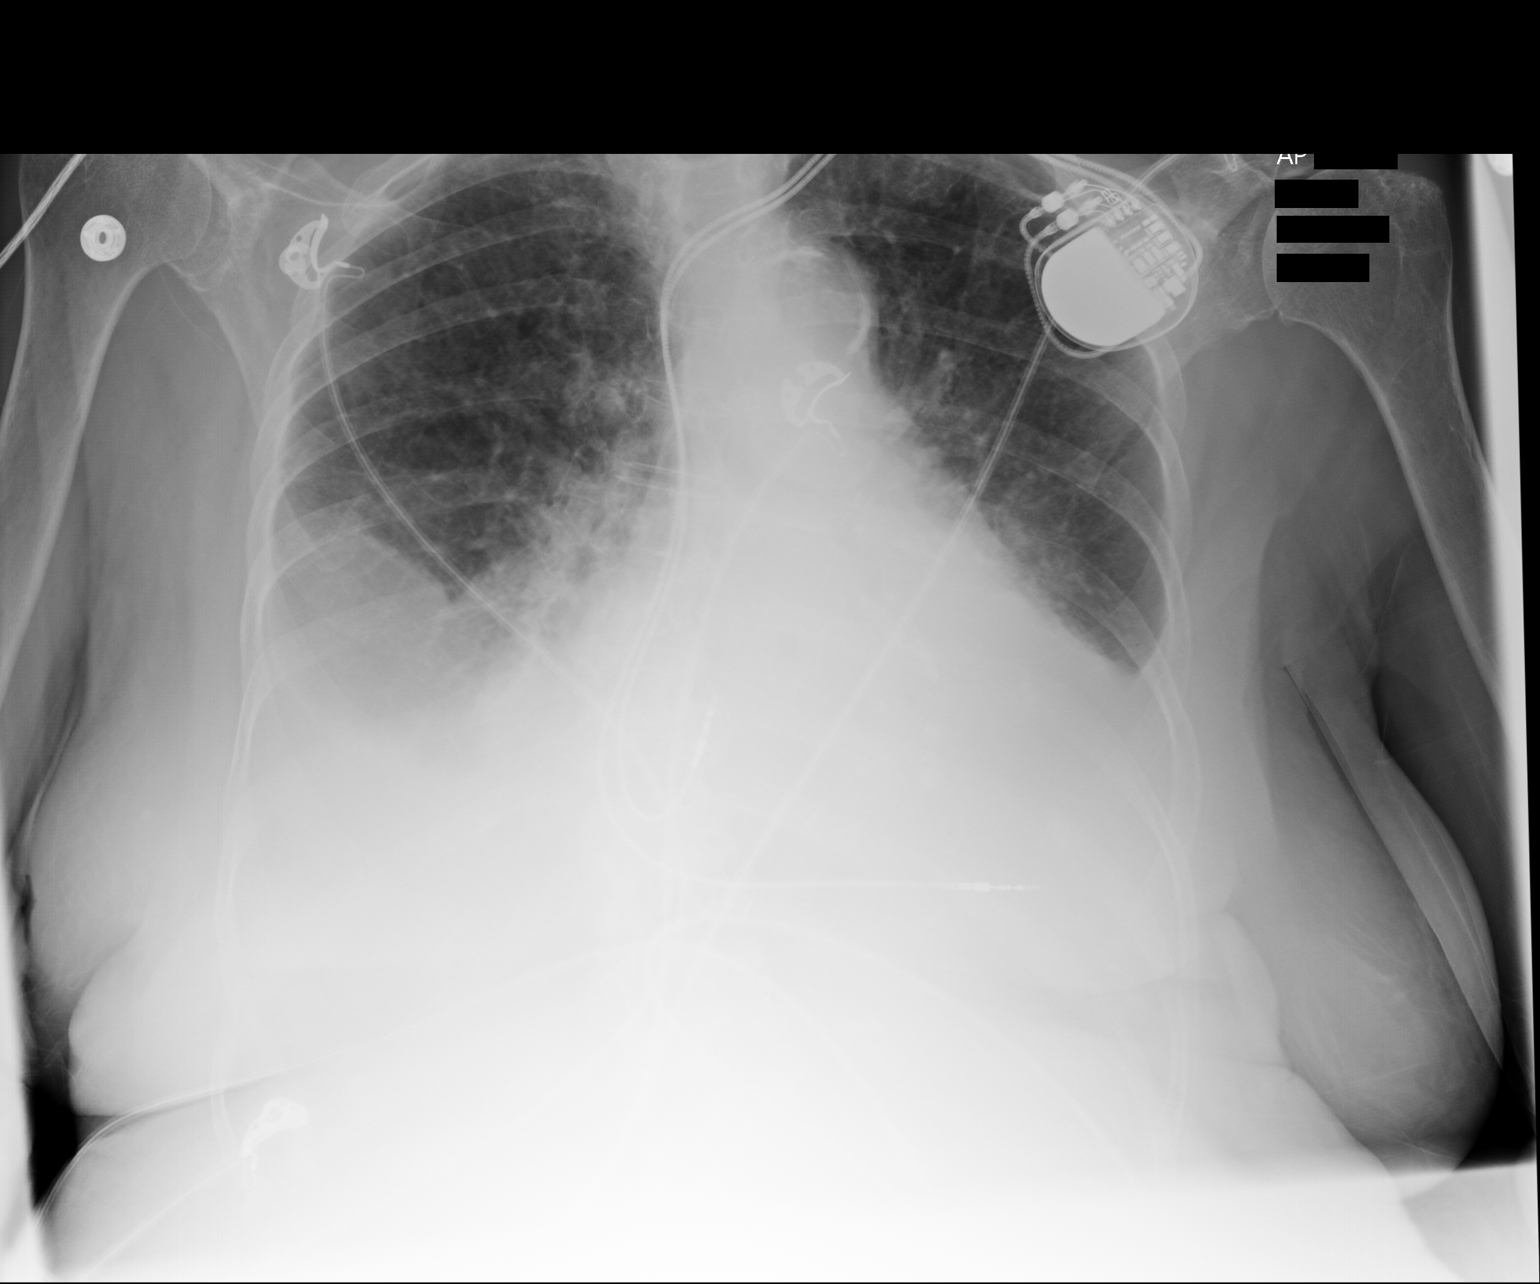

[1 of 1 positions shown; findings below may reference images not displayed]

FINDINGS: Left-sided transvenous pacemaker leads to the right atrium and right
ventricle. The heart is enlarged. There are bilateral pleural
effusions. Bibasilar opacities obscure the hemidiaphragms
bilaterally and appear stable.
IMPRESSION: 1. Cardiomegaly and bibasilar opacities.
2. Stable appearance.
